# Patient Record
Sex: Male | Born: 1950 | Race: White | Hispanic: No | Marital: Married | Smoking: Former smoker
Health system: Southern US, Community
[De-identification: ages and names within clinical notes are randomized; demographics above are authoritative.]

## PROBLEM LIST (undated history)

## (undated) DIAGNOSIS — Z720 Tobacco use: Secondary | ICD-10-CM

## (undated) DIAGNOSIS — E119 Type 2 diabetes mellitus without complications: Secondary | ICD-10-CM

## (undated) DIAGNOSIS — I499 Cardiac arrhythmia, unspecified: Secondary | ICD-10-CM

## (undated) DIAGNOSIS — Z9289 Personal history of other medical treatment: Secondary | ICD-10-CM

## (undated) DIAGNOSIS — I219 Acute myocardial infarction, unspecified: Secondary | ICD-10-CM

## (undated) DIAGNOSIS — E876 Hypokalemia: Secondary | ICD-10-CM

## (undated) DIAGNOSIS — F32A Depression, unspecified: Secondary | ICD-10-CM

## (undated) DIAGNOSIS — E785 Hyperlipidemia, unspecified: Secondary | ICD-10-CM

## (undated) DIAGNOSIS — N200 Calculus of kidney: Secondary | ICD-10-CM

## (undated) DIAGNOSIS — I340 Nonrheumatic mitral (valve) insufficiency: Secondary | ICD-10-CM

## (undated) DIAGNOSIS — I10 Essential (primary) hypertension: Secondary | ICD-10-CM

## (undated) DIAGNOSIS — F329 Major depressive disorder, single episode, unspecified: Secondary | ICD-10-CM

## (undated) DIAGNOSIS — E669 Obesity, unspecified: Secondary | ICD-10-CM

## (undated) DIAGNOSIS — I4819 Other persistent atrial fibrillation: Secondary | ICD-10-CM

## (undated) DIAGNOSIS — I2510 Atherosclerotic heart disease of native coronary artery without angina pectoris: Secondary | ICD-10-CM

## (undated) HISTORY — DX: Obesity, unspecified: E66.9

## (undated) HISTORY — DX: Tobacco use: Z72.0

## (undated) HISTORY — DX: Type 2 diabetes mellitus without complications: E11.9

## (undated) HISTORY — PX: TONSILLECTOMY: SUR1361

## (undated) HISTORY — DX: Nonrheumatic mitral (valve) insufficiency: I34.0

## (undated) HISTORY — DX: Depression, unspecified: F32.A

## (undated) HISTORY — DX: Atherosclerotic heart disease of native coronary artery without angina pectoris: I25.10

## (undated) HISTORY — DX: Cardiac arrhythmia, unspecified: I49.9

## (undated) HISTORY — PX: HERNIA REPAIR: SHX51

## (undated) HISTORY — DX: Major depressive disorder, single episode, unspecified: F32.9

## (undated) HISTORY — DX: Essential (primary) hypertension: I10

## (undated) HISTORY — DX: Hypokalemia: E87.6

## (undated) HISTORY — DX: Personal history of other medical treatment: Z92.89

## (undated) HISTORY — DX: Other persistent atrial fibrillation: I48.19

## (undated) HISTORY — DX: Hyperlipidemia, unspecified: E78.5

## (undated) HISTORY — PX: LAMINECTOMY: SHX219

---

## 2012-09-17 HISTORY — PX: SPINE SURGERY: SHX786

## 2012-10-24 DIAGNOSIS — M48061 Spinal stenosis, lumbar region without neurogenic claudication: Secondary | ICD-10-CM | POA: Insufficient documentation

## 2013-11-15 DIAGNOSIS — I219 Acute myocardial infarction, unspecified: Secondary | ICD-10-CM

## 2013-11-15 HISTORY — DX: Acute myocardial infarction, unspecified: I21.9

## 2013-12-07 ENCOUNTER — Inpatient Hospital Stay: Payer: Self-pay | Admitting: Internal Medicine

## 2013-12-07 DIAGNOSIS — I10 Essential (primary) hypertension: Secondary | ICD-10-CM

## 2013-12-07 DIAGNOSIS — I214 Non-ST elevation (NSTEMI) myocardial infarction: Secondary | ICD-10-CM

## 2013-12-07 DIAGNOSIS — E785 Hyperlipidemia, unspecified: Secondary | ICD-10-CM

## 2013-12-07 LAB — COMPREHENSIVE METABOLIC PANEL
Albumin: 4.1 g/dL (ref 3.4–5.0)
Alkaline Phosphatase: 74 U/L
Anion Gap: 7 (ref 7–16)
BUN: 24 mg/dL — ABNORMAL HIGH (ref 7–18)
Bilirubin,Total: 0.6 mg/dL (ref 0.2–1.0)
Calcium, Total: 9 mg/dL (ref 8.5–10.1)
Chloride: 102 mmol/L (ref 98–107)
Co2: 30 mmol/L (ref 21–32)
Creatinine: 0.98 mg/dL (ref 0.60–1.30)
EGFR (African American): >60
EGFR (Non-African Amer.): >60
Glucose: 187 mg/dL — ABNORMAL HIGH (ref 65–99)
Osmolality: 287 (ref 275–301)
Potassium: 3.1 mmol/L — ABNORMAL LOW (ref 3.5–5.1)
SGOT(AST): 34 U/L (ref 15–37)
SGPT (ALT): 57 U/L (ref 12–78)
Sodium: 139 mmol/L (ref 136–145)
Total Protein: 7.7 g/dL (ref 6.4–8.2)

## 2013-12-07 LAB — CK TOTAL AND CKMB (NOT AT ARMC)
CK, Total: 656 U/L — ABNORMAL HIGH
CK, Total: 488 U/L — ABNORMAL HIGH
CK-MB: 12.7 ng/mL — ABNORMAL HIGH (ref 0.5–3.6)
CK-MB: 9.7 ng/mL — ABNORMAL HIGH (ref 0.5–3.6)

## 2013-12-07 LAB — CBC WITH DIFFERENTIAL/PLATELET
Basophil #: 0 10*3/uL (ref 0.0–0.1)
Basophil %: 0.9 %
Eosinophil #: 0.2 10*3/uL (ref 0.0–0.7)
Eosinophil %: 3.5 %
HCT: 38.8 % — ABNORMAL LOW (ref 40.0–52.0)
HGB: 13.5 g/dL (ref 13.0–18.0)
Lymphocyte #: 1.6 10*3/uL (ref 1.0–3.6)
Lymphocyte %: 28.9 %
MCH: 30.3 pg (ref 26.0–34.0)
MCHC: 34.8 g/dL (ref 32.0–36.0)
MCV: 87 fL (ref 80–100)
Monocyte #: 0.6 x10 3/mm (ref 0.2–1.0)
Monocyte %: 10.6 %
Neutrophil #: 3.1 10*3/uL (ref 1.4–6.5)
Neutrophil %: 56.1 %
Platelet: 219 10*3/uL (ref 150–440)
RBC: 4.46 10*6/uL (ref 4.40–5.90)
RDW: 13.4 % (ref 11.5–14.5)
WBC: 5.5 10*3/uL (ref 3.8–10.6)

## 2013-12-07 LAB — TROPONIN I
Troponin-I: <0.02 ng/mL
Troponin-I: 0.07 ng/mL — ABNORMAL HIGH
Troponin-I: 0.08 ng/mL — ABNORMAL HIGH

## 2013-12-07 LAB — CBC
HCT: 40.7 % (ref 40.0–52.0)
HGB: 14 g/dL (ref 13.0–18.0)
MCH: 29.8 pg (ref 26.0–34.0)
MCHC: 34.4 g/dL (ref 32.0–36.0)
MCV: 87 fL (ref 80–100)
Platelet: 220 10*3/uL (ref 150–440)
RBC: 4.69 10*6/uL (ref 4.40–5.90)
RDW: 13.5 % (ref 11.5–14.5)
WBC: 6.4 10*3/uL (ref 3.8–10.6)

## 2013-12-07 LAB — CK-MB: CK-MB: 10.8 ng/mL — ABNORMAL HIGH (ref 0.5–3.6)

## 2013-12-07 LAB — APTT: Activated PTT: 29.1 secs (ref 23.6–35.9)

## 2013-12-07 LAB — LIPID PANEL
Cholesterol: 182 mg/dL (ref 0–200)
HDL Cholesterol: 33 mg/dL — ABNORMAL LOW (ref 40–60)
Ldl Cholesterol, Calc: 104 mg/dL — ABNORMAL HIGH (ref 0–100)
Triglycerides: 224 mg/dL — ABNORMAL HIGH (ref 0–200)
VLDL Cholesterol, Calc: 45 mg/dL — ABNORMAL HIGH (ref 5–40)

## 2013-12-07 LAB — PRO B NATRIURETIC PEPTIDE: B-Type Natriuretic Peptide: 197 pg/mL — ABNORMAL HIGH (ref 0–125)

## 2013-12-08 DIAGNOSIS — I2510 Atherosclerotic heart disease of native coronary artery without angina pectoris: Secondary | ICD-10-CM

## 2013-12-08 DIAGNOSIS — I059 Rheumatic mitral valve disease, unspecified: Secondary | ICD-10-CM

## 2013-12-08 HISTORY — PX: CORONARY ANGIOPLASTY: SHX604

## 2013-12-08 HISTORY — PX: CARDIAC CATHETERIZATION: SHX172

## 2013-12-08 LAB — BASIC METABOLIC PANEL
Anion Gap: 7 (ref 7–16)
BUN: 17 mg/dL (ref 7–18)
Calcium, Total: 7.8 mg/dL — ABNORMAL LOW (ref 8.5–10.1)
Chloride: 103 mmol/L (ref 98–107)
Co2: 28 mmol/L (ref 21–32)
Creatinine: 0.85 mg/dL (ref 0.60–1.30)
EGFR (African American): >60
EGFR (Non-African Amer.): >60
Glucose: 165 mg/dL — ABNORMAL HIGH (ref 65–99)
Osmolality: 281 (ref 275–301)
Potassium: 3.2 mmol/L — ABNORMAL LOW (ref 3.5–5.1)
Sodium: 138 mmol/L (ref 136–145)

## 2013-12-08 LAB — APTT
Activated PTT: 41.6 secs — ABNORMAL HIGH (ref 23.6–35.9)
Activated PTT: 52.3 secs — ABNORMAL HIGH (ref 23.6–35.9)
Activated PTT: 57.1 secs — ABNORMAL HIGH (ref 23.6–35.9)
Activated PTT: 57.2 secs — ABNORMAL HIGH (ref 23.6–35.9)

## 2013-12-09 ENCOUNTER — Encounter: Payer: Self-pay | Admitting: Cardiovascular Disease

## 2013-12-09 LAB — BASIC METABOLIC PANEL
Anion Gap: 10 (ref 7–16)
BUN: 11 mg/dL (ref 7–18)
Calcium, Total: 8.3 mg/dL — ABNORMAL LOW (ref 8.5–10.1)
Chloride: 102 mmol/L (ref 98–107)
Co2: 28 mmol/L (ref 21–32)
Creatinine: 0.84 mg/dL (ref 0.60–1.30)
EGFR (African American): >60
EGFR (Non-African Amer.): >60
Glucose: 113 mg/dL — ABNORMAL HIGH (ref 65–99)
Osmolality: 280 (ref 275–301)
Potassium: 2.9 mmol/L — ABNORMAL LOW (ref 3.5–5.1)
Sodium: 140 mmol/L (ref 136–145)

## 2013-12-09 LAB — POTASSIUM: Potassium: 4 mmol/L (ref 3.5–5.1)

## 2013-12-09 LAB — CK TOTAL AND CKMB (NOT AT ARMC)
CK, Total: 235 U/L
CK-MB: 6.3 ng/mL — ABNORMAL HIGH (ref 0.5–3.6)

## 2013-12-09 LAB — MAGNESIUM: Magnesium: 2.1 mg/dL

## 2013-12-14 ENCOUNTER — Encounter: Payer: Self-pay | Admitting: Cardiovascular Disease

## 2013-12-14 ENCOUNTER — Encounter: Payer: Self-pay | Admitting: *Deleted

## 2013-12-14 ENCOUNTER — Ambulatory Visit (INDEPENDENT_AMBULATORY_CARE_PROVIDER_SITE_OTHER): Payer: BC Managed Care – PPO | Admitting: Cardiovascular Disease

## 2013-12-14 VITALS — BP 140/90 | HR 57 | Ht 66.5 in | Wt 191.5 lb

## 2013-12-14 DIAGNOSIS — E785 Hyperlipidemia, unspecified: Secondary | ICD-10-CM

## 2013-12-14 DIAGNOSIS — Z955 Presence of coronary angioplasty implant and graft: Secondary | ICD-10-CM

## 2013-12-14 DIAGNOSIS — I10 Essential (primary) hypertension: Secondary | ICD-10-CM

## 2013-12-14 DIAGNOSIS — I214 Non-ST elevation (NSTEMI) myocardial infarction: Secondary | ICD-10-CM

## 2013-12-14 DIAGNOSIS — Z9861 Coronary angioplasty status: Secondary | ICD-10-CM

## 2013-12-14 DIAGNOSIS — I2510 Atherosclerotic heart disease of native coronary artery without angina pectoris: Secondary | ICD-10-CM

## 2013-12-14 NOTE — Assessment & Plan Note (Signed)
DES Lasix to the ostial PLV. Suggested he stay on his aspirin and effient.

## 2013-12-14 NOTE — Assessment & Plan Note (Addendum)
Severe three-vessel disease on recent cardiac catheterization following non-ST elevation MI. Recent stent placed to his ostial posterior lateral branch off the distal RCA. The plan is for repeat catheterization next week with intervention to his diagonal vessel, and attempt to open his left circumflex stenosis. We will continue his aggressive antianginals

## 2013-12-14 NOTE — Assessment & Plan Note (Signed)
He seems to have recovered well from his NSTEMI. Culprit for this likely his distal RCA branch though still with residual severe disease of diagonal vessel and left circumflex. Further intervention scheduled for next week

## 2013-12-14 NOTE — Progress Notes (Signed)
Patient ID: Clinton AxonRaymond Dorsey, male    DOB: 11-05-1950, 63 y.o.   MRN: [id number]  HPI Comments: Clinton Dorsey is a very pleasant 63 year old gentleman with a history of hyperlipidemia, hypertension, prediabetes, strong family history of coronary artery disease, prior occasional tobacco abuse who presented to [facility] 12/07/2013 with chest pain. He ruled in for non-ST elevation MI with CK-MB of 17.7, CK of 656.  Cardiac catheterization was performed that showed severe three-vessel CAD. He had stent for distal RCA/ostial PL branch disease estimated at 99%. He also had mid circumflex with 99% disease, severe diagonal #1 disease. Attempt to place a stent to the mid circumflex was unsuccessful and given that he had significant diet radiation exposure to perform a diagnostic catheterization and thinks the distal RCA, it was recommended that he have PCI of his left circumflex and diagonal vessel at a later date.  In followup today, he reports having laid low over the past week with no significant exertion or other activities. He denies having shortness of breath or chest pain but reports having not been anything around the house if. He feels that his energy might be somewhat better than before  He reports having problems in the past on Crestor, Lipitor, simvastatin    echocardiogram in the hospital showed ejection fraction 50-55%, mildly dilated left atrium, mild TR, low to moderate MR, no focal wall motion abnormalities noted EKG in the hospital 12/08/2013 shows normal sinus rhythm with poor R-wave progression through the anterior precordial leads, PVC EKG today shows normal sinus rhythm with rate 57 beats per minute with nonspecific ST abnormality   Outpatient Encounter Prescriptions as of 12/14/2013  Medication Sig  . amLODipine (NORVASC) 10 MG tablet Take 10 mg by mouth daily.  Marland Kitchen. aspirin 81 MG tablet Take 81 mg by mouth daily.  Marland Kitchen. buPROPion (WELLBUTRIN XL) 150 MG 24 hr tablet Take 150 mg by mouth daily.  .  carvedilol (COREG) 6.25 MG tablet Take 6.25 mg by mouth 2 (two) times daily with a meal.  . isosorbide mononitrate (IMDUR) 30 MG 24 hr tablet Take 30 mg by mouth daily.  Marland Kitchen. losartan (COZAAR) 50 MG tablet Take 50 mg by mouth daily.  . Multiple Vitamin (MULTIVITAMIN) capsule Take 1 capsule by mouth daily.  . nitroGLYCERIN (NITROSTAT) 0.4 MG SL tablet Place 0.4 mg under the tongue every 5 (five) minutes as needed for chest pain.  Marland Kitchen. Potassium Chloride ER 20 MEQ TBCR Take 20 mEq by mouth daily.  . prasugrel (EFFIENT) 10 MG TABS tablet Take 10 mg by mouth daily.  . rosuvastatin (CRESTOR) 20 MG tablet Take 20 mg by mouth daily.     Review of Systems  Constitutional: Negative.   HENT: Negative.   Eyes: Negative.   Respiratory: Negative.   Cardiovascular: Negative.   Gastrointestinal: Negative.   Endocrine: Negative.   Musculoskeletal: Negative.   Skin: Negative.   Allergic/Immunologic: Negative.   Neurological: Negative.   Hematological: Negative.   Psychiatric/Behavioral: Negative.   All other systems reviewed and are negative.    BP 140/90  Pulse 57  Ht 5' 6.5" (1.689 m)  Wt 191 lb 8 oz (86.864 kg)  BMI 30.45 kg/m2  Physical Exam  Nursing note and vitals reviewed. Constitutional: He is oriented to person, place, and time. He appears well-developed and well-nourished.  HENT:  Head: Normocephalic.  Nose: Nose normal.  Mouth/Throat: Oropharynx is clear and moist.  Eyes: Conjunctivae are normal. Pupils are equal, round, and reactive to light.  Neck: Normal range  of motion. Neck supple. No JVD present.  Cardiovascular: Normal rate, regular rhythm, S1 normal, S2 normal, normal heart sounds and intact distal pulses.  Exam reveals no gallop and no friction rub.   No murmur heard. Pulmonary/Chest: Effort normal and breath sounds normal. No respiratory distress. He has no wheezes. He has no rales. He exhibits no tenderness.  Abdominal: Soft. Bowel sounds are normal. He exhibits no  distension. There is no tenderness.  Musculoskeletal: Normal range of motion. He exhibits no edema and no tenderness.  Lymphadenopathy:    He has no cervical adenopathy.  Neurological: He is alert and oriented to person, place, and time. Coordination normal.  Skin: Skin is warm and dry. No rash noted. No erythema.  Psychiatric: He has a normal mood and affect. His behavior is normal. Judgment and thought content normal.      Assessment and Plan

## 2013-12-14 NOTE — Assessment & Plan Note (Signed)
Given his previous statin intolerance, recommended he start Crestor 5 mg with slow titration upwards as tolerated. Samples give.

## 2013-12-14 NOTE — Assessment & Plan Note (Signed)
Blood pressure is well controlled on today's visit. No changes made to the medications. 

## 2013-12-14 NOTE — Patient Instructions (Addendum)
You are doing well. No medication changes were made.  We will schedule a cardiac cath for a week for Wednesday in [place]:   Wednesday, April 8 w/ Dr. Kirke Corin Please arrive at the [facility] at 12:30pm on 12/23/13 You may have a light breakfast that morning, but do not eat anything after 9:00am You may take all of your meds with a small amount of water You must have someone drive you home and to be with your for at least 24 hrs after the procedure.  Please call us if you have new issues that need to be addressed before your next appt.  Your physician wants you to follow-up in:  3 to 4 weeks   Your physician has requested that you have a cardiac catheterization. Cardiac catheterization is used to diagnose and/or treat various heart conditions. Doctors may recommend this procedure for a number of different reasons. The most common reason is to evaluate chest pain. Chest pain can be a symptom of coronary artery disease (CAD), and cardiac catheterization can show whether plaque is narrowing or blocking your heart's arteries. This procedure is also used to evaluate the valves, as well as measure the blood flow and oxygen levels in different parts of your heart. For further information please visit [email or web address] Please follow instruction sheet, as given.

## 2013-12-16 ENCOUNTER — Encounter (HOSPITAL_COMMUNITY): Payer: Self-pay | Admitting: Pharmacy Technician

## 2013-12-22 ENCOUNTER — Other Ambulatory Visit: Payer: Self-pay | Admitting: Cardiovascular Disease

## 2013-12-22 DIAGNOSIS — I2510 Atherosclerotic heart disease of native coronary artery without angina pectoris: Secondary | ICD-10-CM

## 2013-12-23 ENCOUNTER — Encounter (HOSPITAL_COMMUNITY)
Admission: RE | Disposition: A | Discharge status: Home / Self Care | Payer: BC Managed Care – PPO | Source: Ambulatory Visit | Attending: Cardiovascular Disease

## 2013-12-23 ENCOUNTER — Ambulatory Visit (HOSPITAL_COMMUNITY)
Admission: RE | Admit: 2013-12-23 | Discharge: 2013-12-24 | Disposition: A | Discharge status: Home / Self Care | Payer: BC Managed Care – PPO | Source: Ambulatory Visit | Attending: Cardiovascular Disease | Admitting: Cardiovascular Disease

## 2013-12-23 DIAGNOSIS — I209 Angina pectoris, unspecified: Secondary | ICD-10-CM | POA: Insufficient documentation

## 2013-12-23 DIAGNOSIS — Z7982 Long term (current) use of aspirin: Secondary | ICD-10-CM | POA: Insufficient documentation

## 2013-12-23 DIAGNOSIS — I208 Other forms of angina pectoris: Secondary | ICD-10-CM

## 2013-12-23 DIAGNOSIS — I2510 Atherosclerotic heart disease of native coronary artery without angina pectoris: Secondary | ICD-10-CM

## 2013-12-23 DIAGNOSIS — I214 Non-ST elevation (NSTEMI) myocardial infarction: Secondary | ICD-10-CM

## 2013-12-23 DIAGNOSIS — I10 Essential (primary) hypertension: Secondary | ICD-10-CM | POA: Insufficient documentation

## 2013-12-23 DIAGNOSIS — Z6831 Body mass index (BMI) 31.0-31.9, adult: Secondary | ICD-10-CM | POA: Insufficient documentation

## 2013-12-23 DIAGNOSIS — Z7902 Long term (current) use of antithrombotics/antiplatelets: Secondary | ICD-10-CM | POA: Insufficient documentation

## 2013-12-23 DIAGNOSIS — R7309 Other abnormal glucose: Secondary | ICD-10-CM | POA: Insufficient documentation

## 2013-12-23 DIAGNOSIS — E785 Hyperlipidemia, unspecified: Secondary | ICD-10-CM

## 2013-12-23 DIAGNOSIS — Z955 Presence of coronary angioplasty implant and graft: Secondary | ICD-10-CM

## 2013-12-23 DIAGNOSIS — Z8249 Family history of ischemic heart disease and other diseases of the circulatory system: Secondary | ICD-10-CM | POA: Insufficient documentation

## 2013-12-23 DIAGNOSIS — I2089 Other forms of angina pectoris: Secondary | ICD-10-CM | POA: Insufficient documentation

## 2013-12-23 DIAGNOSIS — E669 Obesity, unspecified: Secondary | ICD-10-CM | POA: Insufficient documentation

## 2013-12-23 HISTORY — PX: PERCUTANEOUS CORONARY STENT INTERVENTION (PCI-S): SHX5485

## 2013-12-23 LAB — CBC
HCT: 39.2 % (ref 39.0–52.0)
Hemoglobin: 14.1 g/dL (ref 13.0–17.0)
MCH: 30.7 pg (ref 26.0–34.0)
MCHC: 36 g/dL (ref 30.0–36.0)
MCV: 85.4 fL (ref 78.0–100.0)
Platelets: 285 10*3/uL (ref 150–400)
RBC: 4.59 MIL/uL (ref 4.22–5.81)
RDW: 13 % (ref 11.5–15.5)
WBC: 6.8 10*3/uL (ref 4.0–10.5)

## 2013-12-23 LAB — PROTIME-INR
INR: 0.92 (ref 0.00–1.49)
Prothrombin Time: 12.2 seconds (ref 11.6–15.2)

## 2013-12-23 LAB — BASIC METABOLIC PANEL
BUN: 20 mg/dL (ref 6–23)
CO2: 26 mEq/L (ref 19–32)
Calcium: 9.5 mg/dL (ref 8.4–10.5)
Chloride: 101 mEq/L (ref 96–112)
Creatinine, Ser: 0.98 mg/dL (ref 0.50–1.35)
GFR calc Af Amer: >90 mL/min (ref >90)
GFR calc non Af Amer: 86 mL/min — ABNORMAL LOW (ref >90)
Glucose, Bld: 143 mg/dL — ABNORMAL HIGH (ref 70–99)
Potassium: 3.7 mEq/L (ref 3.7–5.3)
Sodium: 142 mEq/L (ref 137–147)

## 2013-12-23 LAB — POCT ACTIVATED CLOTTING TIME: Activated Clotting Time: 548 seconds

## 2013-12-23 SURGERY — PERCUTANEOUS CORONARY STENT INTERVENTION (PCI-S)
Anesthesia: LOCAL

## 2013-12-23 MED ORDER — ADULT MULTIVITAMIN W/MINERALS CH
1.0000 | ORAL_TABLET | Freq: Every day | ORAL | Status: DC
  Administered 2013-12-24: 10:00:00 1 via ORAL
  Filled 2013-12-23 (×2): qty 1

## 2013-12-23 MED ORDER — CARVEDILOL 6.25 MG PO TABS
6.2500 mg | ORAL_TABLET | Freq: Two times a day (BID) | ORAL | Status: DC
  Administered 2013-12-24: 6.25 mg via ORAL
  Filled 2013-12-23 (×3): qty 1

## 2013-12-23 MED ORDER — NITROGLYCERIN 0.4 MG SL SUBL: 0.4000 mg | SUBLINGUAL_TABLET | SUBLINGUAL | Status: DC | PRN

## 2013-12-23 MED ORDER — PRASUGREL HCL 10 MG PO TABS
10.0000 mg | ORAL_TABLET | Freq: Every day | ORAL | Status: DC
  Administered 2013-12-24: 10 mg via ORAL
  Filled 2013-12-23 (×2): qty 1

## 2013-12-23 MED ORDER — ASPIRIN 81 MG PO CHEW
81.0000 mg | CHEWABLE_TABLET | Freq: Every day | ORAL | Status: DC
  Administered 2013-12-24: 10:00:00 81 mg via ORAL
  Filled 2013-12-23: qty 1

## 2013-12-23 MED ORDER — VERAPAMIL HCL 2.5 MG/ML IV SOLN
INTRAVENOUS | Status: AC
  Filled 2013-12-23: qty 2

## 2013-12-23 MED ORDER — SODIUM CHLORIDE 0.9 % IJ SOLN: 3.0000 mL | Freq: Two times a day (BID) | INTRAMUSCULAR | Status: DC

## 2013-12-23 MED ORDER — MULTIVITAMINS PO CAPS: 1.0000 | ORAL_CAPSULE | Freq: Every day | ORAL | Status: DC

## 2013-12-23 MED ORDER — SODIUM CHLORIDE 0.9 % IV SOLN: 250.0000 mL | INTRAVENOUS | Status: DC | PRN

## 2013-12-23 MED ORDER — PRASUGREL HCL 10 MG PO TABS: 10.0000 mg | ORAL_TABLET | ORAL | Status: DC

## 2013-12-23 MED ORDER — FENTANYL CITRATE 0.05 MG/ML IJ SOLN
INTRAMUSCULAR | Status: AC
  Filled 2013-12-23: qty 2

## 2013-12-23 MED ORDER — HEPARIN (PORCINE) IN NACL 2-0.9 UNIT/ML-% IJ SOLN
INTRAMUSCULAR | Status: AC
  Filled 2013-12-23: qty 1500

## 2013-12-23 MED ORDER — BUPROPION HCL ER (XL) 150 MG PO TB24
150.0000 mg | ORAL_TABLET | Freq: Every day | ORAL | Status: DC
  Administered 2013-12-24: 150 mg via ORAL
  Filled 2013-12-23 (×2): qty 1

## 2013-12-23 MED ORDER — AMLODIPINE BESYLATE 10 MG PO TABS
10.0000 mg | ORAL_TABLET | Freq: Every day | ORAL | Status: DC
  Filled 2013-12-23 (×2): qty 1

## 2013-12-23 MED ORDER — LIDOCAINE HCL (PF) 1 % IJ SOLN
INTRAMUSCULAR | Status: AC
  Filled 2013-12-23: qty 30

## 2013-12-23 MED ORDER — MAGNESIUM OXIDE 400 MG PO TABS
400.0000 mg | ORAL_TABLET | Freq: Every day | ORAL | Status: DC
  Administered 2013-12-24: 400 mg via ORAL
  Filled 2013-12-23 (×2): qty 1

## 2013-12-23 MED ORDER — SODIUM CHLORIDE 0.9 % IJ SOLN: 3.0000 mL | INTRAMUSCULAR | Status: DC | PRN

## 2013-12-23 MED ORDER — BIVALIRUDIN 250 MG IV SOLR
INTRAVENOUS | Status: AC
  Filled 2013-12-23: qty 250

## 2013-12-23 MED ORDER — MIDAZOLAM HCL 2 MG/2ML IJ SOLN
INTRAMUSCULAR | Status: AC
  Filled 2013-12-23: qty 2

## 2013-12-23 MED ORDER — ISOSORBIDE MONONITRATE ER 30 MG PO TB24
30.0000 mg | ORAL_TABLET | Freq: Every day | ORAL | Status: DC
  Administered 2013-12-24: 10:00:00 30 mg via ORAL
  Filled 2013-12-23 (×2): qty 1

## 2013-12-23 MED ORDER — NITROGLYCERIN 0.2 MG/ML ON CALL CATH LAB
INTRAVENOUS | Status: AC
  Filled 2013-12-23: qty 1

## 2013-12-23 MED ORDER — SODIUM CHLORIDE 0.9 % IV SOLN
INTRAVENOUS | Status: DC
  Administered 2013-12-23: 13:00:00 via INTRAVENOUS

## 2013-12-23 MED ORDER — LOSARTAN POTASSIUM 50 MG PO TABS
50.0000 mg | ORAL_TABLET | Freq: Every day | ORAL | Status: DC
  Administered 2013-12-24: 50 mg via ORAL
  Filled 2013-12-23 (×2): qty 1

## 2013-12-23 MED ORDER — SODIUM CHLORIDE 0.9 % IV SOLN
INTRAVENOUS | Status: AC
Start: 2013-12-23 — End: 2013-12-24
  Administered 2013-12-23: 19:00:00 via INTRAVENOUS

## 2013-12-23 MED ORDER — ASPIRIN 81 MG PO TABS: 81.0000 mg | ORAL_TABLET | Freq: Every day | ORAL | Status: DC

## 2013-12-23 MED ORDER — ASPIRIN 81 MG PO CHEW: 81.0000 mg | CHEWABLE_TABLET | ORAL | Status: DC

## 2013-12-23 NOTE — Interval H&P Note (Signed)
Cath Lab Visit (complete for each Cath Lab visit)  Clinical Evaluation Leading to the Procedure:   ACS: yes  Non-ACS:    Anginal Classification: CCS III  Anti-ischemic medical therapy: Maximal Therapy (2 or more classes of medications)  Non-Invasive Test Results: No non-invasive testing performed  Prior CABG: No previous CABG      History and Physical Interval Note:  12/23/2013 3:20 PM  Clinton Dorsey  has presented today for surgery, with the diagnosis of CP/Angina  The various methods of treatment have been discussed with the patient and family. After consideration of risks, benefits and other options for treatment, the patient has consented to  Procedure(s): PERCUTANEOUS CORONARY STENT INTERVENTION (PCI-S) (N/A) as a surgical intervention .  The patient's history has been reviewed, patient examined, no change in status, stable for surgery.  I have reviewed the patient's chart and labs.  Questions were answered to the patient's satisfaction.     Clinton Dorsey

## 2013-12-23 NOTE — H&P (View-Only) (Signed)
 Patient ID: Clinton Dorsey, male    DOB: 10/15/1950, 62 y.o.   MRN: [id number]  HPI Comments: Clinton Dorsey is a very pleasant 62-year-old gentleman with a history of hyperlipidemia, hypertension, prediabetes, strong family history of coronary artery disease, prior occasional tobacco abuse who presented to [facility] 12/07/2013 with chest pain. He ruled in for non-ST elevation MI with CK-MB of 17.7, CK of 656.  Cardiac catheterization was performed that showed severe three-vessel CAD. He had stent for distal RCA/ostial PL branch disease estimated at 99%. He also had mid circumflex with 99% disease, severe diagonal #1 disease. Attempt to place a stent to the mid circumflex was unsuccessful and given that he had significant diet radiation exposure to perform a diagnostic catheterization and thinks the distal RCA, it was recommended that he have PCI of his left circumflex and diagonal vessel at a later date.  In followup today, he reports having laid low over the past week with no significant exertion or other activities. He denies having shortness of breath or chest pain but reports having not been anything around the house if. He feels that his energy might be somewhat better than before  He reports having problems in the past on Crestor, Lipitor, simvastatin    echocardiogram in the hospital showed ejection fraction 50-55%, mildly dilated left atrium, mild TR, low to moderate MR, no focal wall motion abnormalities noted EKG in the hospital 12/08/2013 shows normal sinus rhythm with poor R-wave progression through the anterior precordial leads, PVC EKG today shows normal sinus rhythm with rate 57 beats per minute with nonspecific ST abnormality   Outpatient Encounter Prescriptions as of 12/14/2013  Medication Sig  . amLODipine (NORVASC) 10 MG tablet Take 10 mg by mouth daily.  . aspirin 81 MG tablet Take 81 mg by mouth daily.  . buPROPion (WELLBUTRIN XL) 150 MG 24 hr tablet Take 150 mg by mouth daily.  .  carvedilol (COREG) 6.25 MG tablet Take 6.25 mg by mouth 2 (two) times daily with a meal.  . isosorbide mononitrate (IMDUR) 30 MG 24 hr tablet Take 30 mg by mouth daily.  . losartan (COZAAR) 50 MG tablet Take 50 mg by mouth daily.  . Multiple Vitamin (MULTIVITAMIN) capsule Take 1 capsule by mouth daily.  . nitroGLYCERIN (NITROSTAT) 0.4 MG SL tablet Place 0.4 mg under the tongue every 5 (five) minutes as needed for chest pain.  . Potassium Chloride ER 20 MEQ TBCR Take 20 mEq by mouth daily.  . prasugrel (EFFIENT) 10 MG TABS tablet Take 10 mg by mouth daily.  . rosuvastatin (CRESTOR) 20 MG tablet Take 20 mg by mouth daily.     Review of Systems  Constitutional: Negative.   HENT: Negative.   Eyes: Negative.   Respiratory: Negative.   Cardiovascular: Negative.   Gastrointestinal: Negative.   Endocrine: Negative.   Musculoskeletal: Negative.   Skin: Negative.   Allergic/Immunologic: Negative.   Neurological: Negative.   Hematological: Negative.   Psychiatric/Behavioral: Negative.   All other systems reviewed and are negative.    BP 140/90  Pulse 57  Ht 5' 6.5" (1.689 m)  Wt 191 lb 8 oz (86.864 kg)  BMI 30.45 kg/m2  Physical Exam  Nursing note and vitals reviewed. Constitutional: He is oriented to person, place, and time. He appears well-developed and well-nourished.  HENT:  Head: Normocephalic.  Nose: Nose normal.  Mouth/Throat: Oropharynx is clear and moist.  Eyes: Conjunctivae are normal. Pupils are equal, round, and reactive to light.  Neck: Normal range   of motion. Neck supple. No JVD present.  Cardiovascular: Normal rate, regular rhythm, S1 normal, S2 normal, normal heart sounds and intact distal pulses.  Exam reveals no gallop and no friction rub.   No murmur heard. Pulmonary/Chest: Effort normal and breath sounds normal. No respiratory distress. He has no wheezes. He has no rales. He exhibits no tenderness.  Abdominal: Soft. Bowel sounds are normal. He exhibits no  distension. There is no tenderness.  Musculoskeletal: Normal range of motion. He exhibits no edema and no tenderness.  Lymphadenopathy:    He has no cervical adenopathy.  Neurological: He is alert and oriented to person, place, and time. Coordination normal.  Skin: Skin is warm and dry. No rash noted. No erythema.  Psychiatric: He has a normal mood and affect. His behavior is normal. Judgment and thought content normal.      Assessment and Plan

## 2013-12-23 NOTE — Progress Notes (Signed)
TR BAND REMOVAL  LOCATION:    right radial  DEFLATED PER PROTOCOL:    yes  TIME BAND OFF / DRESSING APPLIED:    20:35   SITE UPON ARRIVAL:    Level 0  SITE AFTER BAND REMOVAL:    Level 0  REVERSE ALLEN'S TEST:     positive  CIRCULATION SENSATION AND MOVEMENT:    Within Normal Limits   yes  COMMENTS:   Pt tolerated removal of TR band without complication. Will continue to monitor pt.

## 2013-12-23 NOTE — CV Procedure (Signed)
   CARDIAC CATH NOTE  Name: Clinton Dorsey MRN: [id number] DOB: 02/14/1951  Procedure: PTCA and drug-eluting stenting of the first diagonal and mid left circumflex.  Indication: Recent non-ST elevation myocardial infarction with three-vessel coronary artery disease.  Medications:  Sedation:  2 mg IV Versed, 100 mcg IV Fentanyl  Contrast:  185 mL Omnipaque  Procedural Details: The right wrist was prepped, draped, and anesthetized with 1% lidocaine. Using the modified Seldinger technique, a 6 Fr sheath was introduced into the radial artery. 3 mg verapamil was administered through the radial sheath. Weight-based bivalirudin was given for anticoagulation. Once a therapeutic ACT was achieved, a 6 [place] XB LAD 3.5 guide catheter was inserted.  A run through coronary guidewire was used to cross the lesion in the proximal first diagonal.  The lesion was predilated with a 2.0 x 12 mm balloon to 8 atmospheres balloon.  The lesion was then stented with a 2.5 x 18 mm Xience drug-eluting stent.  The stent was postdilated with a 2.75 x 15 mm noncompliant balloon.  Following PCI, there was 0% residual stenosis and TIMI-3 flow. The wire was then removed and advanced into the left circumflex. The balloon was advanced to the midsegment to provide support. I was able to cross the lesion in the mid left circumflex without difficulty. The lesion was predilated with a 2.5 x 15 mm sprinter balloon. It was stented with a 2.5 x 28 mm  Xience drug-eluting stent. This was post dilated with a 2.75 x 20 mm noncompliant balloon proximally. OM 2 was a jailed by the stent but this branch is overall small in size with TIMI-3 flow. Final angiography confirmed an excellent result. The patient tolerated the procedure well. There were no immediate procedural complications. A TR band was used for radial hemostasis. The patient was transferred to the post catheterization recovery area for further monitoring.  the patient did have mild  chest discomfort during angioplasty of the left circumflex which persisted for about 15 minutes after the procedure and resolved spontaneously.  PCI Data: Vessel - first diagonal/Segment - proximal Percent Stenosis (pre)  90% TIMI-flow 3 Stent 2.5 x 18 mm Xience drug-eluting stent postdilated with a 2.75 noncompliant balloon Percent Stenosis (post) 0% TIMI-flow (post) 3  Vessel - left circumflex/Segment - mid  Percent Stenosis (pre)  95% TIMI-flow 3 Stent 2.5 x 28 mm Xience drug-eluting stent postdilated with a 2.75 noncompliant balloon Percent Stenosis (post) 0% TIMI-flow (post) 3  Final Conclusions:  Successful angioplasty and drug-eluting stent placement to both first diagonal and mid left circumflex.  Recommendations:  Continue dual antiplatelet therapy for at least one year. There is a residual 40% distal left main stenosis and 50% ostial left circumflex stenosis. Aggressive medical therapy is recommended.  [name] MD, [facility] 12/23/2013, 4:50 PM

## 2013-12-24 ENCOUNTER — Encounter (HOSPITAL_COMMUNITY): Payer: Self-pay | Admitting: *Deleted

## 2013-12-24 DIAGNOSIS — I209 Angina pectoris, unspecified: Secondary | ICD-10-CM

## 2013-12-24 DIAGNOSIS — I214 Non-ST elevation (NSTEMI) myocardial infarction: Secondary | ICD-10-CM

## 2013-12-24 DIAGNOSIS — I2510 Atherosclerotic heart disease of native coronary artery without angina pectoris: Secondary | ICD-10-CM

## 2013-12-24 DIAGNOSIS — Z9861 Coronary angioplasty status: Secondary | ICD-10-CM

## 2013-12-24 LAB — BASIC METABOLIC PANEL
BUN: 12 mg/dL (ref 6–23)
BUN: 14 mg/dL (ref 6–23)
CO2: 25 mEq/L (ref 19–32)
CO2: 27 mEq/L (ref 19–32)
Calcium: 9 mg/dL (ref 8.4–10.5)
Calcium: 9.4 mg/dL (ref 8.4–10.5)
Chloride: 101 mEq/L (ref 96–112)
Chloride: 98 mEq/L (ref 96–112)
Creatinine, Ser: 0.79 mg/dL (ref 0.50–1.35)
Creatinine, Ser: 0.75 mg/dL (ref 0.50–1.35)
GFR calc Af Amer: >90 mL/min (ref >90)
GFR calc Af Amer: >90 mL/min (ref >90)
GFR calc non Af Amer: >90 mL/min (ref >90)
GFR calc non Af Amer: >90 mL/min (ref >90)
Glucose, Bld: 114 mg/dL — ABNORMAL HIGH (ref 70–99)
Glucose, Bld: 137 mg/dL — ABNORMAL HIGH (ref 70–99)
Potassium: 3.2 mEq/L — ABNORMAL LOW (ref 3.7–5.3)
Potassium: 3.8 mEq/L (ref 3.7–5.3)
Sodium: 143 mEq/L (ref 137–147)
Sodium: 138 mEq/L (ref 137–147)

## 2013-12-24 LAB — CBC
HCT: 40 % (ref 39.0–52.0)
Hemoglobin: 14.2 g/dL (ref 13.0–17.0)
MCH: 30.3 pg (ref 26.0–34.0)
MCHC: 35.5 g/dL (ref 30.0–36.0)
MCV: 85.3 fL (ref 78.0–100.0)
Platelets: 262 10*3/uL (ref 150–400)
RBC: 4.69 MIL/uL (ref 4.22–5.81)
RDW: 13.1 % (ref 11.5–15.5)
WBC: 8.8 10*3/uL (ref 4.0–10.5)

## 2013-12-24 MED ORDER — POTASSIUM CHLORIDE CRYS ER 20 MEQ PO TBCR
40.0000 meq | EXTENDED_RELEASE_TABLET | Freq: Once | ORAL | Status: AC
  Administered 2013-12-24: 10:00:00 40 meq via ORAL
  Filled 2013-12-24: qty 2

## 2013-12-24 MED FILL — Sodium Chloride IV Soln 0.9%: INTRAVENOUS | Qty: 50 | Status: AC

## 2013-12-24 NOTE — Care Management Note (Addendum)
  Page 1 of 1   12/24/2013     10:09:12 AM   CARE MANAGEMENT NOTE 12/24/2013  Patient:  Clinton Dorsey, Clinton Dorsey   Account Number:  [id number]  Date Initiated:  12/24/2013  Documentation initiated by:  Donato Schultz  Subjective/Objective Assessment:   admitted with CP/angina     Action/Plan:   CM to follow for dispositon needs   Anticipated DC Date:  12/24/2013   Anticipated DC Plan:  HOME/SELF CARE         Choice offered to / List presented to:             Status of service:  Completed, signed off Medicare Important Message given?   (If response is "NO", the following Medicare IM given date fields will be blank) Date Medicare IM given:   Date Additional Medicare IM given:    Discharge Disposition:  HOME/SELF CARE  Per UR Regulation:    If discussed at Long Length of Stay Meetings, dates discussed:    Comments:  CM Screening Note: PERCUTANEOUS CORONARY STENT INTERVENTION (PCI-S) (N/A) as a surgical intervention on 12/23/2013 Recommends continue Effient (Patient already taking prior to this admission) No other CM needs identified. Kathye Cipriani RN, BSN, [place], [place] 12/24/2013

## 2013-12-24 NOTE — Progress Notes (Signed)
CARDIAC REHAB PHASE I   PRE:  Rate/Rhythm: 65 SR  BP:  Supine: 160/92  Sitting:   Standing:    SaO2:   MODE:  Ambulation: 800 ft   POST:  Rate/Rhythm: 75 SR  BP:  Supine:   Sitting: 176/86  Standing:    SaO2:  0758-0902 Pt walked 800 ft with steady gait. Tolerated well. No CP. Education completed with pt and wife. Included MI ed since pt with recent MI. Understanding voiced. Gave effient booklet. Discussed CRP 2 and permission given for referral to [facility]. Pt is Optician, dispensing. Discussed decreasing stress also.   Luetta Nutting, RN BSN  12/24/2013 8:59 AM

## 2013-12-24 NOTE — Discharge Summary (Signed)
Discharge Summary   Patient ID: Clinton Dorsey MRN: [id number], DOB/AGE: December 19, 1950 63 y.o. Admit date: 12/23/2013 D/C date:     12/24/2013  Primary Cardiologist: Dr. Mariah Milling   Principal Problem:   Coronary atherosclerosis of native coronary artery Active Problems:   NSTEMI (non-ST elevated myocardial infarction)   Hyperlipemia   Essential hypertension   Obesity -Body mass index is 31.01 kg/(m^2).   Pre-Diabetes  Admission Dates: 12/23/13 - 12/24/13 Discharge Diagnosis: CAD s/p PCI w/ DES to both D1 and mLCx   HPI: [name] is a very pleasant 63 year old gentleman with a history of hyperlipidemia, hypertension, prediabetes, strong family history of coronary artery disease and prior occasional tobacco abuse who presented to [facility] 12/07/2013 with chest pain and ruled in for NSTEMI. Cardiac catheterization was performed that showed severe three-vessel CAD. He had stent for distal RCA/ostial PL branch disease estimated at 99%. He also had mid circumflex with 99% disease, severe diagonal #1 disease. Attempt to place a stent to the mid circumflex was unsuccessful and given that he had significant diet radiation exposure to perform a diagnostic catheterization and thinks the distal RCA, it was recommended that he have PCI of his left circumflex and diagonal vessel at a later date. He was seen in the office on 12/14/13 and [facility] for planned PCI of LCx and D1 was scheduled at [place].   Hospital Course: He presented to [facility] on 12/23/13 for elective cardiac catheterization with percutaneous coronary intervention. He underwent cardiac cath and is now s/p successful angioplasty and drug-eluting stent placement to both first diagonal and mid left circumflex. He remains on DAPT with ASA/Effient, which will be continued for at least one year. There is a residual 40% distal left main stenosis and 50% ostial left circumflex stenosis. Aggressive medical therapy is recommended. His potassium was noted to be  3.2 on the day of discharge. He was given a one time dose of KDur 40 MEq and his K normalized to 3.8.  The patient has had an uncomplicated hospital course and is recovering well. The radial catheter site is stable. He has been seen by Dr. Herbie Baltimore today and deemed stable for discharge home. All follow-up appointments have been scheduled. Discharge medications include Coreg 6.25 bid, Losartan 50mg , ASA/Effient, NTG and Crestor 5mg . Imdur & Amlodipine have been held for now and can be readdressed in OP ROV that is already scheduled with Dr. Mariah Milling.   Discharge Vitals: Blood pressure 148/90, pulse 61, temperature 98.2 F (36.8 C), temperature source Oral, resp. rate 18, height 5\' 6"  (1.676 m), weight 192 lb 0.3 oz (87.1 kg), SpO2 94.00%.  Labs: Lab Results  Component Value Date   WBC 8.8 12/24/2013   HGB 14.2 12/24/2013   HCT 40.0 12/24/2013   MCV 85.3 12/24/2013   PLT 262 12/24/2013     Recent Labs Lab 12/24/13 1210  NA 138  K 3.8  CL 98  CO2 27  BUN 14  CREATININE 0.75  CALCIUM 9.4  GLUCOSE 137*    Diagnostic Studies/Procedures   CARDIAC CATH NOTE  Name: Clinton Dorsey  MRN: [id number]  DOB: May 16, 1951  Procedure: PTCA and drug-eluting stenting of the first diagonal and mid left circumflex.  Indication: Recent non-ST elevation myocardial infarction with three-vessel coronary artery disease.  Medications:  Sedation: 2 mg IV Versed, 100 mcg IV Fentanyl  Contrast: 185 mL Omnipaque  Procedural Details: The right wrist was prepped, draped, and anesthetized with 1% lidocaine. Using the modified Seldinger technique, a 6 Fr  sheath was introduced into the radial artery. 3 mg verapamil was administered through the radial sheath. Weight-based bivalirudin was given for anticoagulation. Once a therapeutic ACT was achieved, a 6 [place] XB LAD 3.5 guide catheter was inserted. A run through coronary guidewire was used to cross the lesion in the proximal first diagonal. The lesion was predilated with a  2.0 x 12 mm balloon to 8 atmospheres balloon. The lesion was then stented with a 2.5 x 18 mm Xience drug-eluting stent. The stent was postdilated with a 2.75 x 15 mm noncompliant balloon. Following PCI, there was 0% residual stenosis and TIMI-3 flow. The wire was then removed and advanced into the left circumflex. The balloon was advanced to the midsegment to provide support. I was able to cross the lesion in the mid left circumflex without difficulty. The lesion was predilated with a 2.5 x 15 mm sprinter balloon. It was stented with a 2.5 x 28 mm Xience drug-eluting stent. This was post dilated with a 2.75 x 20 mm noncompliant balloon proximally. OM 2 was a jailed by the stent but this branch is overall small in size with TIMI-3 flow. Final angiography confirmed an excellent result. The patient tolerated the procedure well. There were no immediate procedural complications. A TR band was used for radial hemostasis. The patient was transferred to the post catheterization recovery area for further monitoring. the patient did have mild chest discomfort during angioplasty of the left circumflex which persisted for about 15 minutes after the procedure and resolved spontaneously.  PCI Data:  Vessel - first diagonal/Segment - proximal  Percent Stenosis (pre) 90%  TIMI-flow 3  Stent 2.5 x 18 mm Xience drug-eluting stent postdilated with a 2.75 noncompliant balloon  Percent Stenosis (post) 0%  TIMI-flow (post) 3  Vessel - left circumflex/Segment - mid  Percent Stenosis (pre) 95%  TIMI-flow 3  Stent 2.5 x 28 mm Xience drug-eluting stent postdilated with a 2.75 noncompliant balloon  Percent Stenosis (post) 0%  TIMI-flow (post) 3  Final Conclusions:  Successful angioplasty and drug-eluting stent placement to both first diagonal and mid left circumflex.  Recommendations:  Continue dual antiplatelet therapy for at least one year. There is a residual 40% distal left main stenosis and 50% ostial left circumflex  stenosis. Aggressive medical therapy is recommended.   Discharge Medications     Medication List    STOP taking these medications       amLODipine 10 MG tablet  Commonly known as:  NORVASC     isosorbide mononitrate 30 MG 24 hr tablet  Commonly known as:  IMDUR      TAKE these medications       aspirin 81 MG tablet  Take 81 mg by mouth daily.     buPROPion 150 MG 24 hr tablet  Commonly known as:  WELLBUTRIN XL  Take 150 mg by mouth daily.     carvedilol 6.25 MG tablet  Commonly known as:  COREG  Take 6.25 mg by mouth 2 (two) times daily with a meal.     losartan 50 MG tablet  Commonly known as:  COZAAR  Take 50 mg by mouth daily.     magnesium oxide 400 MG tablet  Commonly known as:  MAG-OX  Take 400 mg by mouth daily.     multivitamin capsule  Take 1 capsule by mouth daily.     nitroGLYCERIN 0.4 MG SL tablet  Commonly known as:  NITROSTAT  Place 0.4 mg under the tongue every 5 (five)  minutes as needed for chest pain.     prasugrel 10 MG Tabs tablet  Commonly known as:  EFFIENT  Take 10 mg by mouth daily.     rosuvastatin 5 MG tablet  Commonly known as:  CRESTOR  Take 5 mg by mouth daily.     TYLENOL PO  Take 1-2 tablets by mouth every 6 (six) hours as needed (pain).     VITAMIN D PO  Take 1 tablet by mouth daily.        Disposition   The patient will be discharged in stable condition to home. Discharge Orders   Future Appointments Provider Department Dept Phone   01/04/2014 3:00 PM Antonieta Ibaimothy J Gollan, MD [facility] [place] [phone]   Future Orders Complete By Expires   Amb Referral to Cardiac Rehabilitation  As directed          Duration of Discharge Encounter: Greater than 30 minutes including physician and PA time.  Signed, Thereasa ParkinKathryn Stern PA-C 12/24/2013, 1:29 PM

## 2013-12-24 NOTE — Discharge Summary (Signed)
Stable post 2 vessel PCI. BP was elevated - will need BP medications adjusted on ROV appt.  Ok for d/c & ROV with Dr. Mariah Milling.  Marykay Lex, MD

## 2013-12-24 NOTE — Discharge Instructions (Signed)

## 2013-12-24 NOTE — Progress Notes (Signed)
   Subjective:  Did fine last PM. Mild aching in chest initially post PCI. Has not walked yet. Has ?s re meds - have reviewed Dr. Windell Hummingbird note, no mention of holding Amlodipine.  Objective:  Vital Signs in the last 24 hours: Temp:  [97.5 F (36.4 C)-98.7 F (37.1 C)] 97.5 F (36.4 C) (04/09 0545) Pulse Rate:  [58-75] 59 (04/09 0545) Resp:  [16-20] 20 (04/09 0545) BP: (133-152)/(79-96) 141/85 mmHg (04/09 0545) SpO2:  [92 %-98 %] 95 % (04/09 0545) Weight:  [191 lb (86.637 kg)-192 lb 0.3 oz (87.1 kg)] 192 lb 0.3 oz (87.1 kg) (04/08 2325)  Intake/Output from previous day: 04/08 0701 - 04/09 0700 In: 1065 [P.O.:240; I.V.:825] Out: 1600 [Urine:1600] Intake/Output from this shift:    Physical Exam:\ General appearance: alert, cooperative, appears stated age and no distress Neck: no adenopathy, no carotid bruit, no JVD, supple, symmetrical, trachea midline and thyroid not enlarged, symmetric, no tenderness/mass/nodules Lungs: clear to auscultation bilaterally and normal percussion bilaterally Heart: regular rate and rhythm, S1, S2 normal, no murmur, click, rub or gallop Abdomen: soft, non-tender; bowel sounds normal; no masses,  no organomegaly Extremities: extremities normal, atraumatic, no cyanosis or edema and R Wrist C/D/I Pulses: 2+ and symmetric Neurologic: Grossly normal  Lab Results:  Recent Labs  12/23/13 1229 12/24/13 0507  WBC 6.8 8.8  HGB 14.1 14.2  PLT 285 262    Recent Labs  12/23/13 1229 12/24/13 0507  NA 142 143  K 3.7 3.2*  CL 101 101  CO2 26 25  GLUCOSE 143* 114*  BUN 20 12  CREATININE 0.98 0.79   Cardiac Studies: Cath/PCI: - Successful angioplasty and drug-eluting stent placement to both first diagonal (Xience DES 2.5 mm x 18 mm --2.75 mm) and mid left circumflex ( Xience DES 2.5 mm x 28 mm -- 2.75 mm)  Assessment/Plan:  Active Problems:   Coronary atherosclerosis of native coronary artery   Stented coronary artery - D1, mCx   NSTEMI (non-ST  elevated myocardial infarction)   Hyperlipemia   Essential hypertension   Angina effort  Doing well s/p Staged multivessel PCI after initial Dx cath @ [facility]. BP stable Will need to ambulate today & if stable, can d/c to home  Would continue BB, ARB, ASA/Effient as well as Crestor.  Hold Imdur & Amlodipine for now on d/c.  Can be readdressed in OP ROV already scheduled with Dr. Mariah Milling.   LOS: 1 day    Marykay Lex 12/24/2013, 7:08 AM

## 2014-01-04 ENCOUNTER — Ambulatory Visit (INDEPENDENT_AMBULATORY_CARE_PROVIDER_SITE_OTHER): Payer: BC Managed Care – PPO | Admitting: Cardiovascular Disease

## 2014-01-04 ENCOUNTER — Encounter: Payer: Self-pay | Admitting: Cardiovascular Disease

## 2014-01-04 VITALS — BP 160/100 | HR 60 | Ht 66.5 in | Wt 189.2 lb

## 2014-01-04 DIAGNOSIS — I10 Essential (primary) hypertension: Secondary | ICD-10-CM

## 2014-01-04 DIAGNOSIS — I2510 Atherosclerotic heart disease of native coronary artery without angina pectoris: Secondary | ICD-10-CM

## 2014-01-04 DIAGNOSIS — E785 Hyperlipidemia, unspecified: Secondary | ICD-10-CM

## 2014-01-04 MED ORDER — NITROGLYCERIN 0.4 MG SL SUBL: 0.4000 mg | SUBLINGUAL_TABLET | SUBLINGUAL | Status: DC | PRN

## 2014-01-04 MED ORDER — LOSARTAN POTASSIUM 100 MG PO TABS: 100.0000 mg | ORAL_TABLET | Freq: Every day | ORAL | Status: DC

## 2014-01-04 MED ORDER — AMLODIPINE BESYLATE 10 MG PO TABS: 10.0000 mg | ORAL_TABLET | Freq: Every day | ORAL | Status: DC

## 2014-01-04 MED ORDER — LOSARTAN POTASSIUM 100 MG PO TABS: 50.0000 mg | ORAL_TABLET | Freq: Every day | ORAL | Status: DC

## 2014-01-04 MED ORDER — PRASUGREL HCL 10 MG PO TABS: 10.0000 mg | ORAL_TABLET | Freq: Every day | ORAL | Status: DC

## 2014-01-04 MED ORDER — CARVEDILOL 6.25 MG PO TABS: 6.2500 mg | ORAL_TABLET | Freq: Two times a day (BID) | ORAL | Status: DC

## 2014-01-04 MED ORDER — ROSUVASTATIN CALCIUM 10 MG PO TABS: 10.0000 mg | ORAL_TABLET | Freq: Every day | ORAL | Status: DC

## 2014-01-04 NOTE — Progress Notes (Signed)
Patient ID: Clinton Dorsey, male    DOB: 07-19-1951, 63 y.o.   MRN: [id number]  HPI Comments: Clinton Dorsey is a very pleasant 63 year old gentleman with a history of hyperlipidemia, hypertension, prediabetes, strong family history of coronary artery disease, prior occasional tobacco abuse who [facility] 12/07/2013 with chest pain. He ruled in for non-ST elevation MI with CK-MB of 17.7, CK of 656.  Cardiac catheterization was performed that showed severe three-vessel CAD. He had stent for distal RCA/ostial PL branch disease estimated at 99%. He also had mid circumflex with 99% disease, severe diagonal #1 disease. Attempt to place a stent to the mid circumflex was unsuccessful and given that he had significant dye and radiation exposure, it was recommended that he have PCI of his left circumflex and diagonal vessel at a later date.   2 weeks ago he had catheterization at [place] with successful stents to his mid circumflex and first diagonal vessel In followup today, he feels well with no significant shortness of breath. He has not been monitoring his blood pressure at home. Some confusion about his medications. He would like to start cardiac rehabilitation. He is tolerating Crestor 5 mg daily   echocardiogram in the hospital showed ejection fraction 50-55%, mildly dilated left atrium, mild TR, low to moderate MR, no focal wall motion abnormalities noted  EKG in the hospital 12/08/2013 shows normal sinus rhythm with poor R-wave progression through the anterior precordial leads, PVC EKG today shows normal sinus rhythm with rate 60 beats per minute with nonspecific ST abnormality   Outpatient Encounter Prescriptions as of 01/04/2014  Medication Sig  . Acetaminophen (TYLENOL PO) Take 1-2 tablets by mouth every 6 (six) hours as needed (pain).  Marland Kitchen. amLODipine (NORVASC) 10 MG tablet Take 10 mg by mouth daily.  Marland Kitchen. aspirin 81 MG tablet Take 81 mg by mouth daily.  Marland Kitchen. buPROPion (WELLBUTRIN XL) 150  MG 24 hr tablet Take 150 mg by mouth daily.  . carvedilol (COREG) 6.25 MG tablet Take 6.25 mg by mouth 2 (two) times daily with a meal.  . Cholecalciferol (VITAMIN D PO) Take 1 tablet by mouth daily.  Marland Kitchen. losartan (COZAAR) 50 MG tablet Take 50 mg by mouth daily.  . Multiple Vitamin (MULTIVITAMIN) capsule Take 1 capsule by mouth daily.  . nitroGLYCERIN (NITROSTAT) 0.4 MG SL tablet Place 0.4 mg under the tongue every 5 (five) minutes as needed for chest pain.  Marland Kitchen. POTASSIUM PO Take 1 tablet by mouth daily. OTC  . prasugrel (EFFIENT) 10 MG TABS tablet Take 10 mg by mouth daily.  . rosuvastatin (CRESTOR) 5 MG tablet Take 5 mg by mouth daily.    Review of Systems  Constitutional: Negative.   HENT: Negative.   Eyes: Negative.   Respiratory: Negative.   Cardiovascular: Negative.   Gastrointestinal: Negative.   Endocrine: Negative.   Musculoskeletal: Negative.   Skin: Negative.   Allergic/Immunologic: Negative.   Neurological: Negative.   Hematological: Negative.   Psychiatric/Behavioral: Negative.   All other systems reviewed and are negative.   BP 160/100  Pulse 60  Ht 5' 6.5" (1.689 m)  Wt 189 lb 4 oz (85.843 kg)  BMI 30.09 kg/m2  Physical Exam  Nursing note and vitals reviewed. Constitutional: He is oriented to person, place, and time. He appears well-developed and well-nourished.  HENT:  Head: Normocephalic.  Nose: Nose normal.  Mouth/Throat: Oropharynx is clear and moist.  Eyes: Conjunctivae are normal. Pupils are equal, round, and reactive to light.  Neck: Normal range  of motion. Neck supple. No JVD present.  Cardiovascular: Normal rate, regular rhythm, S1 normal, S2 normal, normal heart sounds and intact distal pulses.  Exam reveals no gallop and no friction rub.   No murmur heard. Pulmonary/Chest: Effort normal and breath sounds normal. No respiratory distress. He has no wheezes. He has no rales. He exhibits no tenderness.  Abdominal: Soft. Bowel sounds are normal. He  exhibits no distension. There is no tenderness.  Musculoskeletal: Normal range of motion. He exhibits no edema and no tenderness.  Lymphadenopathy:    He has no cervical adenopathy.  Neurological: He is alert and oriented to person, place, and time. Coordination normal.  Skin: Skin is warm and dry. No rash noted. No erythema.  Psychiatric: He has a normal mood and affect. His behavior is normal. Judgment and thought content normal.      Assessment and Plan

## 2014-01-04 NOTE — Patient Instructions (Signed)
You are doing well. Please increase the losartan up to 100 mg daily, take in the pm Take coreg 6.25 mg twice a day Take amlodipine one pill in the AM Asa and effient once a day crestor to 10 mg once a day  Monitor your blood pressure at home Call for consistent pressures >140  Lipid panel in 3 months, fasting (mid July)  Please call us if you have new issues that need to be addressed before your next appt.  Your physician wants you to follow-up in: 6 months.  You will receive a reminder letter in the mail two months in advance. If you don't receive a letter, please call our office to schedule the follow-up appointment.

## 2014-01-04 NOTE — Assessment & Plan Note (Signed)
Blood pressure running high today. We'll increase his losartan to 100 mg daily. Encouraged him to check his blood pressure at home and call our office with numbers

## 2014-01-04 NOTE — Assessment & Plan Note (Signed)
Successful stenting to his distal RCA, circumflex and diagonal vessel. We will schedule him for cardiac rehabilitation. Continue aspirin and effient.

## 2014-01-04 NOTE — Assessment & Plan Note (Signed)
Crestor increased up to 10 mg daily. Repeat lipid panel in 3 months

## 2014-01-13 ENCOUNTER — Encounter: Payer: Self-pay | Admitting: Cardiovascular Disease

## 2014-01-15 ENCOUNTER — Encounter: Payer: Self-pay | Admitting: Cardiovascular Disease

## 2014-02-15 ENCOUNTER — Encounter: Payer: Self-pay | Admitting: Cardiovascular Disease

## 2014-03-17 ENCOUNTER — Encounter: Payer: Self-pay | Admitting: Cardiovascular Disease

## 2014-04-05 ENCOUNTER — Ambulatory Visit (INDEPENDENT_AMBULATORY_CARE_PROVIDER_SITE_OTHER): Payer: BC Managed Care – PPO | Admitting: *Deleted

## 2014-04-05 DIAGNOSIS — I25119 Atherosclerotic heart disease of native coronary artery with unspecified angina pectoris: Secondary | ICD-10-CM

## 2014-04-05 DIAGNOSIS — I2510 Atherosclerotic heart disease of native coronary artery without angina pectoris: Secondary | ICD-10-CM

## 2014-04-05 DIAGNOSIS — E785 Hyperlipidemia, unspecified: Secondary | ICD-10-CM

## 2014-04-05 DIAGNOSIS — I209 Angina pectoris, unspecified: Secondary | ICD-10-CM

## 2014-04-06 LAB — LIPID PANEL
Chol/HDL Ratio: 3.6 ratio units (ref 0.0–5.0)
Cholesterol, Total: 128 mg/dL (ref 100–199)
HDL: 36 mg/dL — ABNORMAL LOW (ref >39)
LDL Calculated: 61 mg/dL (ref 0–99)
Triglycerides: 156 mg/dL — ABNORMAL HIGH (ref 0–149)
VLDL Cholesterol Cal: 31 mg/dL (ref 5–40)

## 2014-04-06 LAB — HEPATIC FUNCTION PANEL
ALT: 48 IU/L — ABNORMAL HIGH (ref 0–44)
AST: 50 IU/L — ABNORMAL HIGH (ref 0–40)
Albumin: 4.6 g/dL (ref 3.6–4.8)
Alkaline Phosphatase: 68 IU/L (ref 39–117)
Bilirubin, Direct: 0.18 mg/dL (ref 0.00–0.40)
Total Bilirubin: 0.5 mg/dL (ref 0.0–1.2)
Total Protein: 6.8 g/dL (ref 6.0–8.5)

## 2014-04-17 ENCOUNTER — Encounter: Payer: Self-pay | Admitting: Cardiovascular Disease

## 2014-05-18 ENCOUNTER — Encounter: Payer: Self-pay | Admitting: Cardiovascular Disease

## 2014-06-17 ENCOUNTER — Encounter: Payer: Self-pay | Admitting: Cardiovascular Disease

## 2014-06-30 ENCOUNTER — Telehealth: Payer: Self-pay

## 2014-06-30 NOTE — Telephone Encounter (Signed)
It is too early to stop his medications for the stent, asa and effient He could try a saline nasal spray Could consider scheduling a visit with ear nose throat. They could perhaps cauterize any friable vessel. Ideally we used the thinners for at least one year

## 2014-06-30 NOTE — Telephone Encounter (Signed)
Pt states last night he had a bad nose bleed and took most of the night to stop, states he has had this happen before. States he is on aspirin and Effient.

## 2014-06-30 NOTE — Telephone Encounter (Signed)
Pt has an appt for 6 mo f/u on 07/13/14. Do you want to make any changes to his blood thinners before that time?

## 2014-07-01 NOTE — Telephone Encounter (Signed)
Spoke w/ pt.  Advised him of Dr. Gollan's recommendation.  He verbalizes understanding and will call back w/ any questions or concerns.  

## 2014-07-13 ENCOUNTER — Ambulatory Visit (INDEPENDENT_AMBULATORY_CARE_PROVIDER_SITE_OTHER): Payer: BC Managed Care – PPO | Admitting: Cardiovascular Disease

## 2014-07-13 ENCOUNTER — Encounter: Payer: Self-pay | Admitting: Cardiovascular Disease

## 2014-07-13 VITALS — BP 160/100 | HR 56 | Ht 66.5 in | Wt 188.5 lb

## 2014-07-13 DIAGNOSIS — I10 Essential (primary) hypertension: Secondary | ICD-10-CM

## 2014-07-13 DIAGNOSIS — I25119 Atherosclerotic heart disease of native coronary artery with unspecified angina pectoris: Secondary | ICD-10-CM

## 2014-07-13 DIAGNOSIS — E785 Hyperlipidemia, unspecified: Secondary | ICD-10-CM

## 2014-07-13 DIAGNOSIS — R5383 Other fatigue: Secondary | ICD-10-CM

## 2014-07-13 NOTE — Patient Instructions (Addendum)
Your next appointment will be scheduled in our new office located at :  [facility]  [address], [address]  [place], [place] [address]  You are doing well. No medication changes were made.  Please call us if you have new issues that need to be addressed before your next appt.  Your physician wants you to follow-up in: 6 months.  You will receive a reminder letter in the mail two months in advance. If you don't receive a letter, please call our office to schedule the follow-up appointment.   

## 2014-07-13 NOTE — Assessment & Plan Note (Signed)
Cholesterol is at goal on the current lipid regimen. No changes to the medications were made.  

## 2014-07-13 NOTE — Assessment & Plan Note (Signed)
Blood pressure is elevated in the office today, improved on recheck. Encouraged him to monitor his blood pressure at home and call us if numbers run high

## 2014-07-13 NOTE — Progress Notes (Signed)
Patient ID: Clinton Dorsey, male    DOB: [birth year], 63 y.o.   MRN: [id number]  HPI Comments: Clinton Dorsey is a very pleasant 63 year old gentleman with a history of hyperlipidemia, hypertension, prediabetes, strong family history of coronary artery disease, prior occasional tobacco abuse who [facility] 12/07/2013 with chest pain. He ruled in for non-ST elevation MI with CK-MB of 17.7, CK of 656.  Cardiac catheterization was performed that showed severe three-vessel CAD. He had stent for distal RCA/ostial PL branch disease estimated at 99%. He also had mid circumflex with 99% disease, severe diagonal #1 disease. Attempt to place a stent to the mid circumflex was unsuccessful and given that he had significant dye and radiation exposure, it was recommended that he have PCI of his left circumflex and diagonal vessel at a later date. Followup catheterization at [place] with successful stents to his mid circumflex and first diagonal vessel  In general he reports that he is doing well. Significant stress with his job, poor sleep, averaging only 5 hours per night. Significant fatigue He reports his blood pressure is well controlled at home with systolic pressure typically 120s. He did cardiac rehabilitation with close monitoring of his blood pressure and was told it was well controlled He denies any shortness of breath or chest pain with exertion.   echocardiogram in the hospital showed ejection fraction 50-55%, mildly dilated left atrium, mild TR, low to moderate MR, no focal wall motion abnormalities noted  EKG shows normal sinus rhythm with rate 63 beats per minute, left axis deviation   Outpatient Encounter Prescriptions as of 07/13/2014  Medication Sig  . amLODipine (NORVASC) 10 MG tablet Take 1 tablet (10 mg total) by mouth daily.  Marland Kitchen. aspirin 81 MG tablet Take 81 mg by mouth daily.  Marland Kitchen. buPROPion (WELLBUTRIN XL) 150 MG 24 hr tablet Take 150 mg by mouth daily.  . carvedilol (COREG) 6.25  MG tablet Take 1 tablet (6.25 mg total) by mouth 2 (two) times daily with a meal.  . Cholecalciferol (VITAMIN D PO) Take 2 tablets by mouth daily.   . hydrochlorothiazide (MICROZIDE) 12.5 MG capsule Take 12.5 mg by mouth daily.   Marland Kitchen. losartan (COZAAR) 100 MG tablet Take 1 tablet (100 mg total) by mouth daily.  . magnesium oxide (MAG-OX) 400 MG tablet Take 400 mg by mouth daily.  . Multiple Vitamin (MULTIVITAMIN) capsule Take 1 capsule by mouth daily.  . nitroGLYCERIN (NITROSTAT) 0.4 MG SL tablet Place 1 tablet (0.4 mg total) under the tongue every 5 (five) minutes as needed for chest pain.  Marland Kitchen. POTASSIUM PO Take 1 tablet by mouth every other day. OTC  . prasugrel (EFFIENT) 10 MG TABS tablet Take 1 tablet (10 mg total) by mouth daily.  . rosuvastatin (CRESTOR) 10 MG tablet Take 20 mg by mouth daily.  Marland Kitchen. ZETIA 10 MG tablet Take 10 mg by mouth daily.    Review of Systems  Constitutional: Negative.   HENT: Negative.   Eyes: Negative.   Respiratory: Negative.   Cardiovascular: Negative.   Gastrointestinal: Negative.   Endocrine: Negative.   Musculoskeletal: Negative.   Skin: Negative.   Allergic/Immunologic: Negative.   Neurological: Negative.   Hematological: Negative.   Psychiatric/Behavioral: Negative.   All other systems reviewed and are negative.   BP 160/100  Pulse 56  Ht 5' 6.5" (1.689 m)  Wt 188 lb 8 oz (85.503 kg)  BMI 29.97 kg/m2  repeat blood pressure 140 systolic over 90 Physical Exam  Nursing note and  vitals reviewed. Constitutional: He is oriented to person, place, and time. He appears well-developed and well-nourished.  HENT:  Head: Normocephalic.  Nose: Nose normal.  Mouth/Throat: Oropharynx is clear and moist.  Eyes: Conjunctivae are normal. Pupils are equal, round, and reactive to light.  Neck: Normal range of motion. Neck supple. No JVD present.  Cardiovascular: Normal rate, regular rhythm, S1 normal, S2 normal, normal heart sounds and intact distal pulses.  Exam  reveals no gallop and no friction rub.   No murmur heard. Pulmonary/Chest: Effort normal and breath sounds normal. No respiratory distress. He has no wheezes. He has no rales. He exhibits no tenderness.  Abdominal: Soft. Bowel sounds are normal. He exhibits no distension. There is no tenderness.  Musculoskeletal: Normal range of motion. He exhibits no edema and no tenderness.  Lymphadenopathy:    He has no cervical adenopathy.  Neurological: He is alert and oriented to person, place, and time. Coordination normal.  Skin: Skin is warm and dry. No rash noted. No erythema.  Psychiatric: He has a normal mood and affect. His behavior is normal. Judgment and thought content normal.      Assessment and Plan

## 2014-07-13 NOTE — Assessment & Plan Note (Signed)
Currently with no symptoms of angina. No further workup at this time. Continue current medication regimen. 

## 2014-07-18 ENCOUNTER — Encounter: Payer: Self-pay | Admitting: Cardiovascular Disease

## 2014-08-26 ENCOUNTER — Encounter (HOSPITAL_COMMUNITY): Payer: Self-pay | Admitting: Cardiovascular Disease

## 2014-11-13 ENCOUNTER — Other Ambulatory Visit: Payer: Self-pay | Admitting: Cardiovascular Disease

## 2014-12-20 ENCOUNTER — Encounter: Payer: Self-pay | Admitting: Cardiovascular Disease

## 2014-12-20 ENCOUNTER — Ambulatory Visit (INDEPENDENT_AMBULATORY_CARE_PROVIDER_SITE_OTHER): Payer: BLUE CROSS/BLUE SHIELD | Admitting: Cardiovascular Disease

## 2014-12-20 VITALS — BP 158/100 | HR 53 | Ht 66.0 in | Wt 183.0 lb

## 2014-12-20 DIAGNOSIS — E785 Hyperlipidemia, unspecified: Secondary | ICD-10-CM | POA: Diagnosis not present

## 2014-12-20 DIAGNOSIS — I10 Essential (primary) hypertension: Secondary | ICD-10-CM | POA: Diagnosis not present

## 2014-12-20 DIAGNOSIS — I25119 Atherosclerotic heart disease of native coronary artery with unspecified angina pectoris: Secondary | ICD-10-CM | POA: Diagnosis not present

## 2014-12-20 DIAGNOSIS — Z955 Presence of coronary angioplasty implant and graft: Secondary | ICD-10-CM

## 2014-12-20 DIAGNOSIS — I214 Non-ST elevation (NSTEMI) myocardial infarction: Secondary | ICD-10-CM

## 2014-12-20 NOTE — Assessment & Plan Note (Signed)
Recommended he restart Crestor 10 mg daily if tolerated. He reports having memory problems on 20 mg Stressed importance of aggressive cholesterol control

## 2014-12-20 NOTE — Patient Instructions (Addendum)
You are doing well.  Move either the amlodipine or losartan to the evening Please monitor your blood pressure Call the office if it runs high  Please retry crestor 10 mg daily  Please call us if you have new issues that need to be addressed before your next appt.  Your physician wants you to follow-up in: 6 months.  You will receive a reminder letter in the mail two months in advance. If you don't receive a letter, please call our office to schedule the follow-up appointment.

## 2014-12-20 NOTE — Assessment & Plan Note (Signed)
Recommended he stay on his aspirin and effient

## 2014-12-20 NOTE — Assessment & Plan Note (Signed)
Recommended he move his amlodipine or losartan to the evening, and continue to closely monitor his blood pressure. Suggested he call he office for frequent systolic pressures more them 140

## 2014-12-20 NOTE — Assessment & Plan Note (Signed)
Currently with no symptoms of angina. No further workup at this time. Continue current medication regimen. 

## 2014-12-20 NOTE — Progress Notes (Signed)
Patient ID: Clinton Dorsey, male    DOB: 1951-02-07, 64 y.o.   MRN: [id number]  HPI Comments: Clinton Dorsey is a very pleasant 64 year old gentleman with a history of hyperlipidemia, hypertension, prediabetes, strong family history of coronary artery disease, prior occasional tobacco abuse who presented to [facility] 12/07/2013 with chest pain. He ruled in for non-ST elevation MI with CK-MB of 17.7, CK of 656. He presents today for follow-up of his coronary artery disease  He reports that he has stopped his Crestor. He was concerned about memory loss. Without the Crestor, he feels his memory has improved. Blood pressure elevated on today's visit. He has not been monitoring his blood pressure at home Denies any chest pain or shortness of breath with exertion Significant stress with his job, poor sleep, averaging only 5 hours per night.   EKG shows normal sinus rhythm with rate 53 beats per minute, left axis deviation, poor R-wave progression to the anterior precordial leads, consider old anterior MI  Other past medical history Cardiac catheterization was performed that showed severe three-vessel CAD. He had stent for distal RCA/ostial PL branch disease estimated at 99%. He also had mid circumflex with 99% disease, severe diagonal #1 disease. Attempt to place a stent to the mid circumflex was unsuccessful and given that he had significant dye and radiation exposure, it was recommended that he have PCI of his left circumflex and diagonal vessel at a later date. Followup catheterization at [place] with successful stents to his mid circumflex and first diagonal vessel   echocardiogram in the hospital showed ejection fraction 50-55%, mildly dilated left atrium, mild TR, low to moderate MR, no focal wall motion abnormalities noted    Allergies  Allergen Reactions  . Lipitor [Atorvastatin]     Myalgias   . Penicillins     Rash   . Zocor [Simvastatin]     Myalgias     Outpatient Encounter  Prescriptions as of 12/20/2014  Medication Sig  . amLODipine (NORVASC) 10 MG tablet TAKE 1 TABLET (10 MG TOTAL) BY MOUTH DAILY.  Marland Kitchen aspirin 81 MG tablet Take 81 mg by mouth daily.  Marland Kitchen buPROPion (WELLBUTRIN XL) 150 MG 24 hr tablet Take 150 mg by mouth daily.  . carvedilol (COREG) 6.25 MG tablet TAKE 1 TABLET (6.25 MG TOTAL) BY MOUTH 2 (TWO) TIMES DAILY WITH A MEAL.  Marland Kitchen Cholecalciferol (VITAMIN D PO) Take 2 tablets by mouth daily.   Marland Kitchen ezetimibe (ZETIA) 10 MG tablet Take 10 mg by mouth daily.  . hydrochlorothiazide (MICROZIDE) 12.5 MG capsule Take 12.5 mg by mouth daily.   Marland Kitchen losartan (COZAAR) 100 MG tablet TAKE 1 TABLET (100 MG TOTAL) BY MOUTH DAILY.  . magnesium oxide (MAG-OX) 400 MG tablet Take 400 mg by mouth daily.  . Multiple Vitamin (MULTIVITAMIN) capsule Take 1 capsule by mouth daily.  . nitroGLYCERIN (NITROSTAT) 0.4 MG SL tablet Place 1 tablet (0.4 mg total) under the tongue every 5 (five) minutes as needed for chest pain.  . prasugrel (EFFIENT) 10 MG TABS tablet Take 1 tablet (10 mg total) by mouth daily.  . [DISCONTINUED] POTASSIUM PO Take 1 tablet by mouth every other day. OTC  . [DISCONTINUED] ZETIA 10 MG tablet Take 10 mg by mouth daily.     Past Medical History  Diagnosis Date  . Hypertension   . Hyperlipidemia   . Prediabetes   . Depression   . Obesity   . Coronary artery disease     a. 12/23/13 s/p PCI w/ DES  to both D1 and mLCx    Past Surgical History  Procedure Laterality Date  . Laminectomy    . Hernia repair    . Tonsillectomy    . Cardiac catheterization  12/08/2013  . Coronary angioplasty  12/08/2013    s/p stent placement  . Percutaneous coronary stent intervention (pci-s) N/A 12/23/2013    Procedure: PERCUTANEOUS CORONARY STENT INTERVENTION (PCI-S);  Surgeon: [name], MD;  Location: [facility] CATH LAB;  Service: Cardiovascular;  Laterality: N/A;    Social History  reports that he quit smoking about a year ago. His smoking use included Cigars. He does not have  any smokeless tobacco history on file. He reports that he drinks about 0.6 oz of alcohol per week. He reports that he does not use illicit drugs.  Family History family history includes Heart attack (age of onset: 37) in his father; Heart disease in his father; Hypertension in his mother and sister.       Review of Systems  Constitutional: Negative.   Respiratory: Negative.   Cardiovascular: Negative.   Gastrointestinal: Negative.   Musculoskeletal: Negative.   Skin: Negative.   Neurological: Negative.   Hematological: Negative.   Psychiatric/Behavioral: Negative.   All other systems reviewed and are negative.   BP 158/100 mmHg  Pulse 53  Ht 5\' 6"  (1.676 m)  Wt 183 lb (83.008 kg)  BMI 29.55 kg/m2  Physical Exam  Constitutional: He is oriented to person, place, and time. He appears well-developed and well-nourished.  HENT:  Head: Normocephalic.  Nose: Nose normal.  Mouth/Throat: Oropharynx is clear and moist.  Eyes: Conjunctivae are normal. Pupils are equal, round, and reactive to light.  Neck: Normal range of motion. Neck supple. No JVD present.  Cardiovascular: Normal rate, regular rhythm, S1 normal, S2 normal, normal heart sounds and intact distal pulses.  Exam reveals no gallop and no friction rub.   No murmur heard. Pulmonary/Chest: Effort normal and breath sounds normal. No respiratory distress. He has no wheezes. He has no rales. He exhibits no tenderness.  Abdominal: Soft. Bowel sounds are normal. He exhibits no distension. There is no tenderness.  Musculoskeletal: Normal range of motion. He exhibits no edema or tenderness.  Lymphadenopathy:    He has no cervical adenopathy.  Neurological: He is alert and oriented to person, place, and time. Coordination normal.  Skin: Skin is warm and dry. No rash noted. No erythema.  Psychiatric: He has a normal mood and affect. His behavior is normal. Judgment and thought content normal.      Assessment and Plan   Nursing  note and vitals reviewed.

## 2015-01-08 NOTE — H&P (Signed)
PATIENT NAME:  Clinton Dorsey, Clinton Dorsey MR#:  [id number] DATE OF BIRTH:  1950/11/25  DATE OF ADMISSION:  12/07/2013  PRIMARY CARE PHYSICIAN: Dr. Jeananne Rama.   CHIEF COMPLAINT: Chest pain/indigestion.   HISTORY OF PRESENT ILLNESS: This is a very pleasant 63 year old male with a history of hypertension, prediabetes, hyperlipidemia who presents with the above complaint. The patient says a week ago, he developed this kind of indigestion substernally. It went up to his throat and lasted about 20 or 30 minutes and went away. He did not have any symptoms all week. This morning, he woke up about 4:00 with the same discomfort. He took some antacid and it was better, so he went back to sleep. When he woke up, he was getting ready for work, and he just felt this intensity of this pain on his chest going up to his throat with pretty severe, about 9/10. It is worse with exertion and better with breath. It lasted about 30 minutes. No other symptoms associated with it.   REVIEW OF SYSTEMS:  CONSTITUTIONAL: No fever, fatigue, or weakness. EYES: No blurry or double vision, glaucoma, or cataracts. ENT: No ear pain, hearing loss, or seasonal allergy. RESPIRATORY: No cough, wheezing, hemoptysis, or COPD.  CARDIOVASCULAR: Positive chest pain. Positive indigestion. No palpitations, syncope, edema.  GASTROINTESTINAL: No nausea, vomiting, diarrhea, abdominal pain, melena, or ulcers. GENITOURINARY: No dysuria or hematuria.  ENDOCRINE: No polyuria, polydipsia. HEMOLYMPHATIC:  No anemia or easy bruising.  SKIN: No rashes or lesions.  MUSCULOSKELETAL: No pain in his knees or shoulders.  NEUROLOGIC: No history of CVA, TIA, or seizures.  PSYCHIATRIC: No history of anxiety.   PAST MEDICAL HISTORY:  1.  Hypertension.  2.  Hyperlipidemia.  3.  Prediabetes.  4.  Depression.   PAST SURGICAL HISTORY:  1.  Laminectomy.  2.  Hernia repair.  3.  Tonsillectomy.   MEDICATIONS:  1.  Multivitamin 1 tablet daily.  2.  Labetalol 300 mg  b.i.d.  3.  Hydrochlorothiazide telmisartan 12.5/80 daily.  4.  Bupropion 150 mg daily.  5.  Aspirin 81 mg daily.  6.  Norvasc 10 mg daily.   ALLERGIES: PENICILLIN CAUSES A RASH.   SOCIAL HISTORY: The patient smokes a cigar occasionally. He drinks wine daily. No IV drug use.   FAMILY HISTORY: Father had a heart attack at age 26, died of massive heart attack at age 1, and mother had a CVA at 83.   PHYSICAL EXAMINATION:  VITAL SIGNS: Temperature 97.8, pulse 70, respirations 18, blood pressure 141/78, 97% on room air.  GENERAL: Alert, oriented x3.  HEENT: Head is atraumatic. Pupils are round and reactive. Sclerae anicteric. Mucous membranes are moist.  OROPHARYNX: Clear.  NECK: Supple without JVD, carotid bruit or enlarged thyroid.  CARDIOVASCULAR: Regular rate and rhythm. No murmurs, gallops, or rubs. PMI is not displaced.   LUNGS: Clear to auscultation without crackles, rales, rhonchi, or wheezing. Normal percussion. Normal chest expansion.  BACK: No CVA or vertebral tenderness. He does have a scar from back surgery.  ABDOMEN: Bowel sounds are positive and nontender, nondistended. No hepatosplenomegaly.  EXTREMITIES: No clubbing, cyanosis, or edema.  NEUROLOGIC: Cranial nerves II through XII grossly intact. No focal deficits.  SKIN: Without rash or lesions.   LABORATORIES: Initial troponin less than 0.02 and four hours later, the troponin was 0.07. CPK-MB 10.8. CK 657, CPK-MB 12.7. White blood cells 6.4, hemoglobin 14, hematocrit 41. Platelets are 220. Sodium 139, potassium 3.1, chloride 102, bicarbonate 30, BUN 24, creatinine 0.98. Glucose is 187. ALT  57, AST 34, bilirubin 0.6, calcium 9.0, alk phos 74. BNP 197.   Chest x-ray shows no acute cardiopulmonary disease.   EKG shows Q waves in the septal leads.   ASSESSMENT AND PLAN: This is a 62-year-old male who presented a week ago with some indigestion and chest pain who presents again with chest pain, worse with exertion, better with  rest, and elevated troponins consistent with a non-ST-elevation myocardial infarction.  1.  Non-ST-elevation myocardial infarction. The patient will be admitted to telemetry. [facility] has been consulted as per the request of the patient. Will continue to cycle his cardiac enzymes. He will continue on aspirin. HE CANNOT TOLERATE ANY KIND OF STATIN SO WE WILL NOT ADD THAT MEDICATION AT THIS TIME. We can check fasting lipids, however, and possibly use fish oil if needed. The patient is on a full-dose Lovenox. We will continue with his outpatient medications including labetalol at this time.  2.  Hypertension. We will continue Norvasc, labetalol, and his hydrochlorothiazide losartan..  3.  Depression. Continue bupropion.  4.  Hypokalemia. Will replete potassium.  5.  Tobacco dependence. The patient smokes 1 cigar occasionally. We did discuss if the patient wants to quit. The patient can quit at any time. He just does this does occasionally. The patient was counseled for 3-1/2 minutes.   CODE STATUS: FULL CODE.   TIME SPENT: 45 minutes again   ____________________________ Sital P. Mody, MD spm:np D: 12/07/2013 14:43:25 ET T: 12/07/2013 15:09:07 ET JOB#: [id number]  cc: Sital P. Mody, MD, <Dictator> Mark A. Crissman, MD SITAL P MODY MD ELECTRONICALLY SIGNED 12/07/2013 17:43 

## 2015-01-08 NOTE — Consult Note (Signed)
General Aspect Clinton Dorsey is a 64yo Caucasian male w/ no prior cardiac history, PMHx s/f HLD, HTN, obesity, prediabetes, family history of CAD, occasional tobacco abuse and statin intolerance who was admitted to [facility] today for NSTEMI. Cardiology consulted for the same.   He reports no prior cardiac history. He has seen a cardiologist for management of hypertension. He denies prior stress tests, cardiac caths or cardiac surgery.   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 He was moving boxes several days ago without incident. He awoke ~4AM this morning experiencing substernal chest burning radiating to his neck reminiscent of indigestion initially. He tooks Tums with some relief; however the discomfort persisted and worsened with exertion (7/10). His wife checked his pulse which felt "irregular." No palpitations. He became concerned that it was his heart and sought medical attention at [facility] ED.   He reports his father had a MI at 58yo requiring CABG. Mother had CHF in her 12s. He smokes cigars on occasion. He has been intolerant to Crestor, Lipitor and Zocor due to myalgias. He is on several antihypertensives. BP well controlled at home (140/80s).   He denies exertional chest discomfort preceding today. No SOB/DOE, PND, orthopnea, LE edema or syncope.   Present Illness In the ED, EKG revealed incomplete RBBB, subtle TW changes in V1, V2, inferior Q waves vs IVCD. Initial TnI returned WNL, however a second marker returned elevated at 0.07. CMP- K 3.1. BNP 197. CXR- heart size upper limit of normal, otherwise no acute process. CBC WNL.   LDL 104, HDL 33, TG 224, TC 182.  He was admitted by the medicine team. ACS dose Lovenox was ordered. He has a received a full-dose ASA.  PAST MEDICAL HISTORY:  1.  Hypertension.  2.  Hyperlipidemia.  3.  Prediabetes.  4.  Depression.  5.  Obesity  PAST SURGICAL HISTORY:  1.  Laminectomy.  2.  Hernia repair.  3.  Tonsillectomy.    ALLERGIES: PENICILLIN CAUSES A RASH.   SOCIAL HISTORY: The patient smokes a cigar occasionally. He drinks wine daily. No IV drug use.   FAMILY HISTORY: Father had a heart attack at age 80, died of massive heart attack at age 3, and mother had a CVA at 65.   Physical Exam:  GEN no acute distress, obese   HEENT pink conjunctivae, PERRL, hearing intact to voice   NECK supple  No masses  trachea midline  no JVD or bruits   Review of Systems:  Subjective/Chief Complaint chest pain   General: Fatigue   Cardiovascular: Chest pain or discomfort   Review of Systems: All other systems were reviewed and found to be negative   Home Medications: Medication Instructions Status  hydrochlorothiazide-telmisartan 12.5 mg-80 mg oral tablet 1 tab(s) orally once a day Active  labetalol 300 mg oral tablet 1 tab(s) orally 2 times a day Active  amLODIPine 10 mg oral tablet 1 tab(s) orally once a day Active  buPROPion 150 mg/24 hours oral tablet, extended release 1 tab(s) orally every 24 hours Active  multivitamin 1 tab(s) orally once a day Active  aspirin 81 mg oral tablet 1 tab(s) orally once a day Active   Lab Results:  Hepatic:  23-Mar-15 09:56   Bilirubin, Total 0.6  Alkaline Phosphatase 74 (45-117 NOTE: New Reference Range 08/07/13)  SGPT (ALT) 57  SGOT (AST) 34  Total Protein, Serum 7.7  Albumin, Serum 4.1  Routine Chem:  23-Mar-15 09:56   Cholesterol, Serum 182  Triglycerides, Serum  224  HDL (INHOUSE)  33  VLDL Cholesterol Calculated  45  LDL Cholesterol Calculated  104 (Result(s) reported on 07 Dec 2013 at 03:13PM.)  Glucose, Serum  187  BUN  24  Creatinine (comp) 0.98  Sodium, Serum 139  Potassium, Serum  3.1  Chloride, Serum 102  CO2, Serum 30  Calcium (Total), Serum 9.0  Osmolality (calc) 287  eGFR (African American) >60  eGFR (Non-African American) >60 (eGFR values <38m/min/1.73 m2 may be an indication of chronic kidney disease (CKD). Calculated eGFR is  useful in patients with stable renal function. The eGFR calculation will not be reliable in acutely ill patients when serum creatinine is changing rapidly. It is not useful in  patients on dialysis. The eGFR calculation may not be applicable to patients at the low and high extremes of body sizes, pregnant women, and vegetarians.)  Anion Gap 7  B-Type Natriuretic Peptide ([facility])  197 (Result(s) reported on 07 Dec 2013 at 11:35AM.)    13:00   Result Comment troponin - RESULTS VERIFIED BY REPEAT TESTING.  - READ-BACK PROCESS PERFORMED.  - c/linda mclamb,rn 11/29/2013 @ [id number]  Result(s) reported on 07 Dec 2013 at 01:50PM.  Cardiac:  23-Mar-15 09:56   CPK-MB, Serum  12.7 (Result(s) reported on 07 Dec 2013 at 11:35AM.)  Troponin I < 0.02 (0.00-0.05 0.05 ng/mL or less: NEGATIVE  Repeat testing in 3-6 hrs  if clinically indicated. >0.05 ng/mL: POTENTIAL  MYOCARDIAL INJURY. Repeat  testing in 3-6 hrs if  clinically indicated. NOTE: An increase or decrease  of 30% or more on serial  testing suggests a  clinically important change)  CK, Total  656 (39-308 NOTE: NEW REFERENCE RANGE  10/19/2013)    13:00   CPK-MB, Serum  10.8 (Result(s) reported on 07 Dec 2013 at 01:45PM.)  Troponin I  0.07 (0.00-0.05 0.05 ng/mL or less: NEGATIVE  Repeat testing in 3-6 hrs  if clinically indicated. >0.05 ng/mL: POTENTIAL  MYOCARDIAL INJURY. Repeat  testing in 3-6 hrs if  clinically indicated. NOTE: An increase or decrease  of 30% or more on serial  testing suggests a  clinically important change)  Routine Hem:  23-Mar-15 09:56   WBC (CBC) 6.4  RBC (CBC) 4.69  Hemoglobin (CBC) 14.0  Hematocrit (CBC) 40.7  Platelet Count (CBC) 220 (Result(s) reported on 07 Dec 2013 at 10:19AM.)  MCV 87  MCH 29.8  MCHC 34.4  RDW 13.5   EKG:  Interpretation NSR, incomplete RBBB, IVCD vs Q waves III, aVF, LAD, TWI/blunting V1, V2, no ST changes   Rate 66   EKG Comparision no prior for comparison    Radiology Results: XRay:    23-Mar-15 09:48, Chest PA and Lateral  Chest PA and Lateral   REASON FOR EXAM:    Chest Pain  COMMENTS:       PROCEDURE: DXR - DXR CHEST PA (OR AP) AND LATERAL  - Dec 07 2013  9:48AM     CLINICAL DATA:  Chest pain    EXAM:  CHEST  2 VIEW    COMPARISON:  None.    FINDINGS:  Heart is upper limits normal in size. Lungs are clear. No effusions.  No acute bony abnormality.   IMPRESSION:  No active cardiopulmonary disease.      Electronically Signed    By: KRolm BaptiseM.D.    On: 12/07/2013 10:10         Verified By: KRaelyn Number M.D.,    Penicillin: Rash  Impression 1. NSTEMI The patient describeds experiencing sudden onset substernal chest burning radiating to his neck, worse on exertion beginning ~4AM today. No prior cardiac history. Cardiac RFs include obesity, HTN, HLD, prediabetes meeting criteria for metabolic syndrome. Father had a MI at 33yo. Objectively, EKG reveals subtle nonspecific findings-- question inferior Q waves. Initial TnI WNL, however a second marker is mildly elevated. The chest pain subsided in the ED, but recurred mildly with exertion to the restroom. Currently pain free. Symptoms and troponin elevation consistent w/ NSTEMI.  -- Plan cardiac catheterization tomorrow AM -- Obtain 2D echo -- Heparin, low-dose ASA, BB, NTG SL PRN -- Hold statin given h/o myalgias -- Check TSH  2. Hyperlipidemia LDL 104, HDL 33.  Statin intolerant. -- Diet & exercise recommended  3. Hypertension BP 180/94 on ED arrival. 140/80s at home. Better controlled currently. -- Continue amlodipine, BB, ARB -- Hold HCTZ  4. Hypokalemia 3.1 on CMP -- Replete -- Hold HCTZ  5. Prediabetes -- Check Hgb A1C -- Diet & exercise  6. Snoring -- Obtain outpatient sleep study   Electronic Signatures for Addendum Section:  Kathlyn Sacramento (MD) (Signed Addendum 23-Mar-15 17:25)  The patient was seen and examined. Agree with the above. He  presented with substernal chest tightness different from prior GERD.This lasted almost all day long. No previous cardiac history. No murmurs by exam. TnI is mildly elevated. ECG with inferior and septal Q waves.  The presentation is consistent with a small NSTEMI.  Recommend: Heparin drip, continue Aspirin.  Echo.  Cardiac cath tomorrow if cath lab schedule allows.   Electronic Signatures: Meriel Pica (PA-C)  (Signed 23-Mar-15 16:41)  Authored: General Aspect/Present Illness, History and Physical Exam, Review of System, Home Medications, Labs, EKG , Radiology, Allergies, Impression/Plan Kathlyn Sacramento (MD)  (Signed 23-Mar-15 17:25)  Co-Signer: General Aspect/Present Illness, History and Physical Exam, Review of System, Home Medications, Labs, EKG , Radiology, Allergies, Impression/Plan   Last Updated: 23-Mar-15 17:25 by Kathlyn Sacramento (MD)

## 2015-01-08 NOTE — Discharge Summary (Signed)
PATIENT NAME:  Clinton Dorsey, Clinton Dorsey MR#:  [id number] DATE OF BIRTH:  Feb 08, 1951  DATE OF ADMISSION:  12/07/2013 DATE OF DISCHARGE:  12/09/2013  PRIMARY CARE PHYSICIAN: Dr. Vonita Moss.   FINAL DIAGNOSES:  1. Acute myocardial infarction, status post stent.  2. Hypertension.  3. Hypokalemia.  4. Prediabetes.   MEDICATIONS ON DISCHARGE: Include amlodipine 10 mg daily, Wellbutrin extended release 150 mg every 24 hours, multivitamin 1 tablet daily, aspirin 81 mg daily, losartan 50 mg daily, potassium chloride 20 mEq 1 tablet daily for 7 days and then stop, Effient 10 mg p.o. daily, Imdur 30 mg p.o. daily, nitroglycerin 0.4 mg 1 tablet sublingual as needed for angina or chest pain every 5 minutes max 3, Coreg 6.25 mg twice a day. The patient will stop taking labetalol and stop taking hydrochlorothiazide/telmisartan.    ACTIVITY: As tolerated.   DIET: Low-sodium, carbohydrate-controlled diet, regular consistency.   FOLLOWUP: With Dr. Mariah Milling, cardiology, in 1 week; 1 to 2 weeks with Dr. Dossie Arbour.   HOSPITAL COURSE: The patient was admitted 12/07/2013 and discharged 12/09/2013. The patient came in with chest pain and indigestion. He was admitted with a non-ST myocardial infarction.   LABORATORY AND RADIOLOGICAL DATA DURING THE HOSPITAL COURSE: Included EKG showed sinus rhythm with marked sinus arrhythmia. Chest x-ray: No active cardiopulmonary disease. LDL 104, HDL 33, triglycerides 224. BNP 197. Glucose 187, BUN 24, creatinine 0.98, sodium 139, potassium 3.1, chloride 102, CO2 30, calcium 9.0. Liver function tests normal range. White blood cell count 6.4, H and H 14.0 and 40.7, platelet count of 220. First troponin negative. Next troponin borderline at 0.07. Next troponin borderline at 0.08. Repeat potassium on the 24th was 3.2. Repeat potassium on the 25th was 2.9. I gave replacement and repeat potassium on the 25th prior to discharge 4.0. Magnesium 2.1. Echocardiogram showed an EF of 50% to 55%, mildly  dilated left atrium, mild tricuspid regurgitation, mild to moderate mitral valve regurgitation. Cardiac cath showed a right dominant coronary system with severe distal RCA disease estimated 99%, severe mid left circumflex disease 90%, severe proximal diagonal disease. The patient had a drug-eluting stent of the 99% stenosis in RPL2 with 0 residual stenosis. The patient will follow up with [facility] for high-risk stenting of the other lesions.   HOSPITAL COURSE PER PROBLEM LIST:  1. For the patient's acute MI, the patient had a stent to the probable culprit lesion. The patient still has to other lesions that need to be followed up with [facility] at [facility] for high-risk stents. That will be arranged as an outpatient in a couple of weeks. Follow up with cardiology, Dr. Mariah Milling, as an outpatient. The patient's medications were changed around. Labetalol was stopped. He was started on Coreg. He is on aspirin, Effient, Imdur and losartan. The patient is unable to tolerate statins, so this is why the statins were not prescribed. The patient was asymptomatic with regards to chest pain or shortness of breath upon discharge and discharged home in stable condition.  2. For his hypertension, blood pressure upon discharge 120/69 with a pulse of 69.  3. Hypokalemia: This was replaced during the hospitalization IV and orally. Potassium upon discharge 4.0. I stopped his hydrochlorothiazide, likely the culprit lesion on why he has persistent hypokalemia. I did give potassium supplementation for another week. I do recommend checking this in followup appointment, but I think the reason for this is the hydrochlorothiazide which was stopped.  4. Prediabetes: Follow up with outpatient medical doctor for  this.    TIME SPENT ON DISCHARGE: 45 minutes.    ____________________________ Herschell Dimes. Renae Gloss, MD rjw:gb D: 12/09/2013 15:41:40 ET T: 12/09/2013 21:25:23 ET JOB#: [id number]  cc: Herschell Dimes.  Renae Gloss, MD, <Dictator> Steele Sizer, MD Antonieta Iba, MD Salley Scarlet MD ELECTRONICALLY SIGNED 12/11/2013 16:30

## 2015-02-11 ENCOUNTER — Other Ambulatory Visit: Payer: Self-pay | Admitting: Cardiovascular Disease

## 2015-03-01 ENCOUNTER — Other Ambulatory Visit: Payer: Self-pay | Admitting: Family Medicine

## 2015-03-04 ENCOUNTER — Other Ambulatory Visit: Payer: Self-pay | Admitting: Family Medicine

## 2015-03-11 ENCOUNTER — Ambulatory Visit: Payer: BLUE CROSS/BLUE SHIELD | Admitting: Cardiovascular Disease

## 2015-03-16 ENCOUNTER — Ambulatory Visit: Payer: BLUE CROSS/BLUE SHIELD | Admitting: Cardiovascular Disease

## 2015-04-21 ENCOUNTER — Ambulatory Visit: Payer: BLUE CROSS/BLUE SHIELD | Admitting: Cardiovascular Disease

## 2015-05-26 ENCOUNTER — Emergency Department: Payer: BLUE CROSS/BLUE SHIELD

## 2015-05-26 ENCOUNTER — Encounter: Payer: Self-pay | Admitting: Emergency Medicine

## 2015-05-26 ENCOUNTER — Observation Stay
Admission: EM | Admit: 2015-05-26 | Discharge: 2015-05-27 | Disposition: A | Discharge status: Home / Self Care | Payer: BLUE CROSS/BLUE SHIELD | Attending: Internal Medicine | Admitting: Internal Medicine

## 2015-05-26 DIAGNOSIS — Z8249 Family history of ischemic heart disease and other diseases of the circulatory system: Secondary | ICD-10-CM | POA: Insufficient documentation

## 2015-05-26 DIAGNOSIS — E785 Hyperlipidemia, unspecified: Secondary | ICD-10-CM | POA: Insufficient documentation

## 2015-05-26 DIAGNOSIS — I25119 Atherosclerotic heart disease of native coronary artery with unspecified angina pectoris: Principal | ICD-10-CM | POA: Insufficient documentation

## 2015-05-26 DIAGNOSIS — Z79899 Other long term (current) drug therapy: Secondary | ICD-10-CM | POA: Insufficient documentation

## 2015-05-26 DIAGNOSIS — K224 Dyskinesia of esophagus: Secondary | ICD-10-CM | POA: Insufficient documentation

## 2015-05-26 DIAGNOSIS — R079 Chest pain, unspecified: Secondary | ICD-10-CM | POA: Diagnosis present

## 2015-05-26 DIAGNOSIS — Z955 Presence of coronary angioplasty implant and graft: Secondary | ICD-10-CM | POA: Diagnosis not present

## 2015-05-26 DIAGNOSIS — Z87442 Personal history of urinary calculi: Secondary | ICD-10-CM | POA: Diagnosis not present

## 2015-05-26 DIAGNOSIS — I10 Essential (primary) hypertension: Secondary | ICD-10-CM | POA: Insufficient documentation

## 2015-05-26 DIAGNOSIS — I25111 Atherosclerotic heart disease of native coronary artery with angina pectoris with documented spasm: Secondary | ICD-10-CM | POA: Insufficient documentation

## 2015-05-26 DIAGNOSIS — I252 Old myocardial infarction: Secondary | ICD-10-CM | POA: Insufficient documentation

## 2015-05-26 DIAGNOSIS — Z7982 Long term (current) use of aspirin: Secondary | ICD-10-CM | POA: Diagnosis not present

## 2015-05-26 DIAGNOSIS — Z888 Allergy status to other drugs, medicaments and biological substances status: Secondary | ICD-10-CM | POA: Insufficient documentation

## 2015-05-26 DIAGNOSIS — I2510 Atherosclerotic heart disease of native coronary artery without angina pectoris: Secondary | ICD-10-CM | POA: Insufficient documentation

## 2015-05-26 DIAGNOSIS — Z87891 Personal history of nicotine dependence: Secondary | ICD-10-CM | POA: Diagnosis not present

## 2015-05-26 DIAGNOSIS — F329 Major depressive disorder, single episode, unspecified: Secondary | ICD-10-CM | POA: Diagnosis not present

## 2015-05-26 DIAGNOSIS — R001 Bradycardia, unspecified: Secondary | ICD-10-CM | POA: Insufficient documentation

## 2015-05-26 DIAGNOSIS — E876 Hypokalemia: Secondary | ICD-10-CM | POA: Insufficient documentation

## 2015-05-26 DIAGNOSIS — R0789 Other chest pain: Secondary | ICD-10-CM | POA: Insufficient documentation

## 2015-05-26 DIAGNOSIS — R072 Precordial pain: Secondary | ICD-10-CM | POA: Diagnosis not present

## 2015-05-26 DIAGNOSIS — Z880 Allergy status to penicillin: Secondary | ICD-10-CM | POA: Diagnosis not present

## 2015-05-26 DIAGNOSIS — E669 Obesity, unspecified: Secondary | ICD-10-CM | POA: Diagnosis not present

## 2015-05-26 DIAGNOSIS — I209 Angina pectoris, unspecified: Secondary | ICD-10-CM | POA: Insufficient documentation

## 2015-05-26 DIAGNOSIS — K219 Gastro-esophageal reflux disease without esophagitis: Secondary | ICD-10-CM | POA: Diagnosis not present

## 2015-05-26 HISTORY — DX: Acute myocardial infarction, unspecified: I21.9

## 2015-05-26 HISTORY — DX: Calculus of kidney: N20.0

## 2015-05-26 LAB — BASIC METABOLIC PANEL
Anion gap: 11 (ref 5–15)
BUN: 33 mg/dL — ABNORMAL HIGH (ref 6–20)
CO2: 27 mmol/L (ref 22–32)
Calcium: 9.6 mg/dL (ref 8.9–10.3)
Chloride: 102 mmol/L (ref 101–111)
Creatinine, Ser: 1.08 mg/dL (ref 0.61–1.24)
GFR calc Af Amer: >60 mL/min (ref >60)
GFR calc non Af Amer: >60 mL/min (ref >60)
Glucose, Bld: 153 mg/dL — ABNORMAL HIGH (ref 65–99)
Potassium: 2.9 mmol/L — CL (ref 3.5–5.1)
Sodium: 140 mmol/L (ref 135–145)

## 2015-05-26 LAB — CBC
HCT: 40.2 % (ref 40.0–52.0)
Hemoglobin: 13.7 g/dL (ref 13.0–18.0)
MCH: 29.4 pg (ref 26.0–34.0)
MCHC: 34.2 g/dL (ref 32.0–36.0)
MCV: 85.9 fL (ref 80.0–100.0)
Platelets: 237 10*3/uL (ref 150–440)
RBC: 4.67 MIL/uL (ref 4.40–5.90)
RDW: 12.8 % (ref 11.5–14.5)
WBC: 6.2 10*3/uL (ref 3.8–10.6)

## 2015-05-26 LAB — TROPONIN I: Troponin I: <0.03 ng/mL (ref <0.031)

## 2015-05-26 MED ORDER — LOSARTAN POTASSIUM 50 MG PO TABS: 100.0000 mg | ORAL_TABLET | Freq: Once | ORAL | Status: DC

## 2015-05-26 MED ORDER — ASPIRIN 81 MG PO CHEW
324.0000 mg | CHEWABLE_TABLET | Freq: Once | ORAL | Status: AC
  Administered 2015-05-26: 324 mg via ORAL
  Filled 2015-05-26: qty 4

## 2015-05-26 NOTE — ED Notes (Signed)
Pt presents to ED with c/o mid sternal chest pain with radiating pain to the left side of his neck. Pt reports he has been feeling fatigued for the past week+. Denies nausea or sob. Reports feeling lightheaded after taking his nitro. Pt alert with no increased work of breathing or distress noted. Skin warm and dry. Previous cardiac hx with MI X2.

## 2015-05-26 NOTE — ED Notes (Signed)
Report received from Susan RN. Patient care assumed. Patient/RN introduction complete. Will continue to monitor.  

## 2015-05-26 NOTE — ED Provider Notes (Signed)
[facility] Emergency Department Provider Note  ____________________________________________  Time seen: 2055  I have reviewed the triage vital signs and the nursing notes.   HISTORY  Chief Complaint Chest Pain and Dizziness     HPI Starr Alzate is a 64 y.o. male with a history of a prior myocardial infarction approximately a year and a half ago, who presents to the emergency department complaining of some chest discomfort that began at 7 PM tonight.  Mr. Wissler has had 2 cardiac catheterizations for stent placement with a total of 3 stents. These were performed by Dr. Mariah Milling and Dr. Kirke Corin.  This evening, before eating anything for dinner, he started to feel a pressure and discomfort in his chest. He had no shortness of breath, nausea, diaphoresis with this event.  The patient does report being under increased stress over the past week or so. His granddaughter has been ill and required advanced medical intervention. He reports having felt fatigued over the past few days.     Past Medical History  Diagnosis Date  . Hypertension   . Hyperlipidemia   . Prediabetes   . Depression   . Obesity   . Coronary artery disease     a. 12/23/13 s/p PCI w/ DES to both D1 and mLCx  . MI (myocardial infarction)   . Kidney stones     Patient Active Problem List   Diagnosis Date Noted  . Coronary artery disease   . Angina effort 12/23/2013  . Coronary atherosclerosis of native coronary artery 12/14/2013  . NSTEMI (non-ST elevated myocardial infarction) 12/14/2013  . Stented coronary artery - D1, mCx 12/14/2013  . Hyperlipemia 12/14/2013  . Essential hypertension 12/14/2013    Past Surgical History  Procedure Laterality Date  . Laminectomy    . Hernia repair    . Tonsillectomy    . Cardiac catheterization  12/08/2013  . Coronary angioplasty  12/08/2013    s/p stent placement  . Percutaneous coronary stent intervention (pci-s) N/A 12/23/2013   Procedure: PERCUTANEOUS CORONARY STENT INTERVENTION (PCI-S);  Surgeon: [name], MD;  Location: [facility] CATH LAB;  Service: Cardiovascular;  Laterality: N/A;    Current Outpatient Rx  Name  Route  Sig  Dispense  Refill  . amLODipine (NORVASC) 10 MG tablet      TAKE 1 TABLET (10 MG TOTAL) BY MOUTH DAILY.   90 tablet   3   . aspirin 81 MG tablet   Oral   Take 81 mg by mouth daily.         Marland Kitchen buPROPion (WELLBUTRIN XL) 150 MG 24 hr tablet   Oral   Take 150 mg by mouth daily.         . carvedilol (COREG) 6.25 MG tablet      TAKE 1 TABLET (6.25 MG TOTAL) BY MOUTH 2 (TWO) TIMES DAILY WITH A MEAL.   180 tablet   3   . Cholecalciferol (VITAMIN D PO)   Oral   Take 2 tablets by mouth daily.          Marland Kitchen EFFIENT 10 MG TABS tablet      TAKE 1 TABLET (10 MG TOTAL) BY MOUTH DAILY.   90 tablet   3   . hydrochlorothiazide (MICROZIDE) 12.5 MG capsule      TAKE ONE CAPSULE BY MOUTH EVERY DAY   90 capsule   1   . losartan (COZAAR) 100 MG tablet      TAKE 1 TABLET (100 MG TOTAL) BY  MOUTH DAILY.   90 tablet   3   . magnesium oxide (MAG-OX) 400 MG tablet   Oral   Take 400 mg by mouth daily.         . Multiple Vitamin (MULTIVITAMIN) capsule   Oral   Take 1 capsule by mouth daily.         . nitroGLYCERIN (NITROSTAT) 0.4 MG SL tablet   Sublingual   Place 1 tablet (0.4 mg total) under the tongue every 5 (five) minutes as needed for chest pain.   25 tablet   6   . rosuvastatin (CRESTOR) 20 MG tablet   Oral   Take 20 mg by mouth daily.         Marland Kitchen ZETIA 10 MG tablet      TAKE 1 TABLET BY MOUTH EVERY DAY   90 tablet   1     Allergies Lipitor; Penicillins; and Zocor  Family History  Problem Relation Age of Onset  . Heart attack Father 29    MI  . Heart disease Father     CABG  . Hypertension Mother   . Hypertension Sister     Social History Social History  Substance Use Topics  . Smoking status: Former Smoker    Types: Cigars    Quit date:  12/08/2013  . Smokeless tobacco: Not on file  . Alcohol Use: 0.6 oz/week    1 Glasses of wine per week    Review of Systems  Constitutional: Negative for fever. Positive for fatigue ENT: Negative for sore throat. Cardiovascular: Chest pressure. See history of present illness. Respiratory: Negative for shortness of breath. Gastrointestinal: Negative for abdominal pain, vomiting and diarrhea. Genitourinary: Negative for dysuria. Musculoskeletal: No myalgias or injuries. Skin: Negative for rash. Neurological: Negative for headaches   10-point ROS otherwise negative.  ____________________________________________   PHYSICAL EXAM:  VITAL SIGNS: ED Triage Vitals  Enc Vitals Group     BP --      Pulse --      Resp --      Temp --      Temp src --      SpO2 --      Weight --      Height --      Head Cir --      Peak Flow --      Pain Score 05/26/15 2019 2     Pain Loc --      Pain Edu? --      Excl. in GC? --     Constitutional:  Alert and oriented. Well appearing and in no distress. ENT   Head: Normocephalic and atraumatic.   Nose: No congestion/rhinnorhea.   Mouth/Throat: Mucous membranes are moist. Cardiovascular: Normal rate at 60, regular rhythm, no murmur noted Respiratory:  Normal respiratory effort, no tachypnea.    Breath sounds are clear and equal bilaterally.  Gastrointestinal: Soft and nontender. No distention.  Back: No muscle spasm, no tenderness, no CVA tenderness. Musculoskeletal: No deformity noted. Nontender with normal range of motion in all extremities.  No noted edema. Neurologic:  Normal speech and language. No gross focal neurologic deficits are appreciated.  Skin:  Skin is warm, dry. No rash noted. Psychiatric: Mood and affect are normal. Speech and behavior are normal.  ____________________________________________    LABS (pertinent positives/negatives)  Labs Reviewed  BASIC METABOLIC PANEL - Abnormal; Notable for the  following:    Potassium 2.9 (*)    Glucose, Bld 153 (*)  BUN 33 (*)    All other components within normal limits  CBC  TROPONIN I     ____________________________________________   EKG  ED ECG REPORT I, Tarron Krolak W, the attending physician, personally viewed and interpreted this ECG.   Date: 05/26/2015  EKG Time: 2014  Rate: 56  Rhythm: Sinus bradycardia  Axis: Normal  Intervals: Normal  ST&T Change: None noted   ____________________________________________    RADIOLOGY  Chest x-ray: FINDINGS: Heart size is upper limits normal. Lungs are free of focal consolidations and pleural effusions. No pulmonary edema.  IMPRESSION: No active cardiopulmonary disease.  ____________________________________________   PROCEDURES    ____________________________________________   INITIAL IMPRESSION / ASSESSMENT AND PLAN / ED COURSE  Pertinent labs & imaging results that were available during my care of the patient were reviewed by me and considered in my medical decision making (see chart for details).  64 year old male with a positive cardiac history with some mild chest discomfort without radiation, nausea, or shortness of breath. Given his history, he is appropriate and concerned. The symptoms started at 7 PM. If his initial troponin is positive, we will certainly admit him to the hospital. If it is negative, we will discuss this with his cardiologist to help decide if he may be discharged home in a few more hours after a second troponin or if he should be admitted due to his history after the first troponin.  ----------------------------------------- 10:54 PM on 05/26/2015 -----------------------------------------  The initial troponin level was negative. I called and spoke with Dr. Elease Hashimoto, cardiology, who has recommended that we keep the patient in the hospital for serial enzymes and observation overnight. I discussed this with the patient. He agrees.  At this  time, his blood pressure has been creeping up a little bit. He has not had his evening dose of losartan. We will provide that for him now.  ____________________________________________   FINAL CLINICAL IMPRESSION(S) / ED DIAGNOSES  Final diagnoses:  Chest discomfort  Coronary artery disease involving native coronary artery of native heart with angina pectoris      Darien Ramus, MD 05/26/15 2257

## 2015-05-26 NOTE — ED Notes (Signed)
Pt awaiting admission, no cp at this time, monitor in place, will continue to monitor.

## 2015-05-27 ENCOUNTER — Encounter: Payer: Self-pay | Admitting: *Deleted

## 2015-05-27 DIAGNOSIS — K224 Dyskinesia of esophagus: Secondary | ICD-10-CM | POA: Diagnosis not present

## 2015-05-27 DIAGNOSIS — R0789 Other chest pain: Secondary | ICD-10-CM | POA: Diagnosis not present

## 2015-05-27 DIAGNOSIS — I25111 Atherosclerotic heart disease of native coronary artery with angina pectoris with documented spasm: Secondary | ICD-10-CM

## 2015-05-27 DIAGNOSIS — E785 Hyperlipidemia, unspecified: Secondary | ICD-10-CM | POA: Insufficient documentation

## 2015-05-27 DIAGNOSIS — I209 Angina pectoris, unspecified: Secondary | ICD-10-CM | POA: Insufficient documentation

## 2015-05-27 DIAGNOSIS — I2510 Atherosclerotic heart disease of native coronary artery without angina pectoris: Secondary | ICD-10-CM | POA: Insufficient documentation

## 2015-05-27 DIAGNOSIS — E876 Hypokalemia: Secondary | ICD-10-CM | POA: Diagnosis not present

## 2015-05-27 LAB — BASIC METABOLIC PANEL
Anion gap: 9 (ref 5–15)
BUN: 19 mg/dL (ref 6–20)
CO2: 27 mmol/L (ref 22–32)
Calcium: 8.6 mg/dL — ABNORMAL LOW (ref 8.9–10.3)
Chloride: 105 mmol/L (ref 101–111)
Creatinine, Ser: 0.82 mg/dL (ref 0.61–1.24)
GFR calc Af Amer: >60 mL/min (ref >60)
GFR calc non Af Amer: >60 mL/min (ref >60)
Glucose, Bld: 206 mg/dL — ABNORMAL HIGH (ref 65–99)
Potassium: 3.3 mmol/L — ABNORMAL LOW (ref 3.5–5.1)
Sodium: 141 mmol/L (ref 135–145)

## 2015-05-27 LAB — TSH: TSH: 3.274 u[IU]/mL (ref 0.350–4.500)

## 2015-05-27 LAB — TROPONIN I
Troponin I: <0.03 ng/mL (ref <0.031)
Troponin I: <0.03 ng/mL (ref <0.031)

## 2015-05-27 LAB — HEMOGLOBIN A1C: Hgb A1c MFr Bld: 9.5 % — ABNORMAL HIGH (ref 4.0–6.0)

## 2015-05-27 MED ORDER — HEPARIN SODIUM (PORCINE) 5000 UNIT/ML IJ SOLN
5000.0000 [IU] | Freq: Three times a day (TID) | INTRAMUSCULAR | Status: DC
  Filled 2015-05-27 (×2): qty 1

## 2015-05-27 MED ORDER — SODIUM CHLORIDE 0.9 % IJ SOLN
3.0000 mL | Freq: Two times a day (BID) | INTRAMUSCULAR | Status: DC
  Administered 2015-05-27: 3 mL via INTRAVENOUS

## 2015-05-27 MED ORDER — AMLODIPINE BESYLATE 10 MG PO TABS
10.0000 mg | ORAL_TABLET | Freq: Every day | ORAL | Status: DC
  Administered 2015-05-27: 10 mg via ORAL
  Filled 2015-05-27: qty 1

## 2015-05-27 MED ORDER — HYDROCHLOROTHIAZIDE 12.5 MG PO CAPS
12.5000 mg | ORAL_CAPSULE | Freq: Every day | ORAL | Status: DC
  Administered 2015-05-27: 12.5 mg via ORAL
  Filled 2015-05-27: qty 1

## 2015-05-27 MED ORDER — NITROGLYCERIN 0.4 MG SL SUBL: 0.4000 mg | SUBLINGUAL_TABLET | SUBLINGUAL | Status: DC | PRN

## 2015-05-27 MED ORDER — MULTIVITAMINS PO CAPS: 1.0000 | ORAL_CAPSULE | Freq: Every day | ORAL | Status: DC

## 2015-05-27 MED ORDER — POTASSIUM CHLORIDE CRYS ER 20 MEQ PO TBCR
EXTENDED_RELEASE_TABLET | ORAL | Status: AC
  Administered 2015-05-27: 40 meq
  Filled 2015-05-27: qty 2

## 2015-05-27 MED ORDER — ROSUVASTATIN CALCIUM 20 MG PO TABS
20.0000 mg | ORAL_TABLET | Freq: Every day | ORAL | Status: DC
  Filled 2015-05-27: qty 1

## 2015-05-27 MED ORDER — PRASUGREL HCL 10 MG PO TABS
10.0000 mg | ORAL_TABLET | Freq: Every day | ORAL | Status: DC
  Administered 2015-05-27: 10 mg via ORAL
  Filled 2015-05-27: qty 1

## 2015-05-27 MED ORDER — MAGNESIUM OXIDE 400 (241.3 MG) MG PO TABS
400.0000 mg | ORAL_TABLET | Freq: Every day | ORAL | Status: DC
  Administered 2015-05-27: 400 mg via ORAL
  Filled 2015-05-27: qty 1

## 2015-05-27 MED ORDER — ACETAMINOPHEN 650 MG RE SUPP
650.0000 mg | Freq: Four times a day (QID) | RECTAL | Status: DC | PRN
Start: 2015-05-27 — End: 2015-05-27

## 2015-05-27 MED ORDER — POTASSIUM CHLORIDE CRYS ER 20 MEQ PO TBCR
40.0000 meq | EXTENDED_RELEASE_TABLET | Freq: Once | ORAL | Status: AC
  Administered 2015-05-27: 40 meq via ORAL
  Filled 2015-05-27: qty 2

## 2015-05-27 MED ORDER — CARVEDILOL 6.25 MG PO TABS
6.2500 mg | ORAL_TABLET | Freq: Two times a day (BID) | ORAL | Status: DC
  Administered 2015-05-27: 6.25 mg via ORAL
  Filled 2015-05-27: qty 1

## 2015-05-27 MED ORDER — LOSARTAN POTASSIUM 50 MG PO TABS
100.0000 mg | ORAL_TABLET | Freq: Every day | ORAL | Status: DC
  Filled 2015-05-27: qty 2

## 2015-05-27 MED ORDER — ASPIRIN 81 MG PO CHEW
81.0000 mg | CHEWABLE_TABLET | Freq: Every day | ORAL | Status: DC
  Filled 2015-05-27: qty 1

## 2015-05-27 MED ORDER — POTASSIUM CHLORIDE IN NACL 40-0.9 MEQ/L-% IV SOLN
INTRAVENOUS | Status: DC
  Administered 2015-05-27 (×2): 125 mL/h via INTRAVENOUS
  Filled 2015-05-27 (×5): qty 1000

## 2015-05-27 MED ORDER — MORPHINE SULFATE (PF) 2 MG/ML IV SOLN: 2.0000 mg | INTRAVENOUS | Status: DC | PRN

## 2015-05-27 MED ORDER — ONDANSETRON HCL 4 MG/2ML IJ SOLN: 4.0000 mg | Freq: Four times a day (QID) | INTRAMUSCULAR | Status: DC | PRN

## 2015-05-27 MED ORDER — CETYLPYRIDINIUM CHLORIDE 0.05 % MT LIQD: 7.0000 mL | Freq: Two times a day (BID) | OROMUCOSAL | Status: DC

## 2015-05-27 MED ORDER — BUPROPION HCL ER (XL) 150 MG PO TB24
150.0000 mg | ORAL_TABLET | Freq: Every day | ORAL | Status: DC
  Administered 2015-05-27: 150 mg via ORAL
  Filled 2015-05-27: qty 1

## 2015-05-27 MED ORDER — ONDANSETRON HCL 4 MG PO TABS
4.0000 mg | ORAL_TABLET | Freq: Four times a day (QID) | ORAL | Status: DC | PRN
Start: 2015-05-27 — End: 2015-05-27

## 2015-05-27 MED ORDER — VITAMIN D 1000 UNITS PO TABS
1000.0000 [IU] | ORAL_TABLET | Freq: Every day | ORAL | Status: DC
  Administered 2015-05-27: 1000 [IU] via ORAL
  Filled 2015-05-27: qty 1

## 2015-05-27 MED ORDER — EZETIMIBE 10 MG PO TABS
10.0000 mg | ORAL_TABLET | Freq: Every day | ORAL | Status: DC
  Filled 2015-05-27: qty 1

## 2015-05-27 MED ORDER — CHLORHEXIDINE GLUCONATE 0.12 % MT SOLN: 15.0000 mL | Freq: Two times a day (BID) | OROMUCOSAL | Status: DC

## 2015-05-27 MED ORDER — ADULT MULTIVITAMIN W/MINERALS CH
1.0000 | ORAL_TABLET | Freq: Every day | ORAL | Status: DC
  Administered 2015-05-27: 1 via ORAL
  Filled 2015-05-27: qty 1

## 2015-05-27 MED ORDER — ACETAMINOPHEN 325 MG PO TABS: 650.0000 mg | ORAL_TABLET | Freq: Four times a day (QID) | ORAL | Status: DC | PRN

## 2015-05-27 NOTE — Plan of Care (Signed)
Problem: Discharge Progression Outcomes Goal: Other Discharge Outcomes/Goals Outcome: Completed/Met Date Met:  05/27/15 Pt is alert and oriented x 4, denies pain, no chest pain, on room air, vital signs stable, up to bathroom with stand by assist, SB on tele, pt is d/c to home, no new medications, cardiologist advised patient to stop taking hydrchlorothiazide, appointment for pt to f/u with cardiologist and pcp already schedulded. Pt is d/c to home via wife, pushed to visitor entrance via nursing staff. Potassium serum level improved.

## 2015-05-27 NOTE — Consult Note (Signed)
Cardiology Consultation Note  Patient ID: Clinton Dorsey, MRN: [id number], DOB/AGE: January 07, 1951 64 y.o. Admit date: 05/26/2015   Date of Consult: 05/27/2015 Primary Physician: Vonita Moss, MD Primary Cardiologist: Dr. Mariah Milling, MD  Chief Complaint: Chest pain Reason for Consult: Chest pain  HPI: 64 y.o. male with h/o CAD/NSTEMI in 11/2013 s/p PCI RPL2 12/08/2013 and to mid LCx and D1 12/23/2013, prediabetes, HTN, HLD, obesity, and depression who presented to [facility] on 05/26/2015 with complaints of reflux and chest pain after getting choked on a piece of cheese stick the night prior.     He was admitted to the hospital in 11/2013 with chest pain. Ruled in for NSTEMI with CK-MB of 17.7 and CK of 656. Cardiac cath showed severe 3 vessel disease. He has stent placed at distal RCA/ostial PL branch in setting of 99% stenosis. Cardiac cath also showed mid LCx with 99% stenosis and 80% prox D1 disease. Attempt to place a stent to the mid circumflex was unsuccessful and given that he had significant dye and radiation exposure, it was recommended that he have PCI of his left circumflex and diagonal vessel at a later date. He underwent successful placement of DES to the mid LCx and D1 lesions at [facility] on 12/23/2013. At his last follow up on 12/20/2014 he reported he had stopped his Crestor. He was concerned about memory loss. Upon stopping this he felt like his memory had improved. He has had some issues with elevated BP. PCP has placed him on HCTZ in addition to his regular antihypertensives.   He exercises regularly without any anginal symptoms.   He was eating a piece of cheese stick on 9/8 and this got stuck in his esophagus. Had a difficult time clearing this. This led to some pain and heart burn-like symptomology. No associated SOB, diaphoresis, palpitations, presyncope, or syncope. Symptoms were not like his prior MI. Prior MI was neck pain. Because of this pain he presented to [facility] just to be safe.   Upon his arrival  to [facility] he was found to have negative troponin x 3, CXR with no active cardiopulmonary disease, ECG sinus bradycardia, 56 bpm, nonspecific inferior st/t changes. He was found to be markedly hypokalemic at 2.9. He was placed in IV repletion. Upon getting IV potassium he noted improved generalized myalgias. BP was mildly elevated this morning, but has since improved. He is symptom free this morning.      Past Medical History  Diagnosis Date  . Hypertension   . Hyperlipidemia   . Prediabetes   . Depression   . Obesity   . Coronary artery disease     a. 12/23/13 s/p PCI w/ DES to both D1 and mLCx  . MI (myocardial infarction)   . Kidney stones       Most Recent Cardiac Studies: Echo 12/08/2013:  EF 55-60%, mild LVH, normal RV size and systolic function, mildly dilated left atrium, mild TR, mild to moderate MR, RVSP mildly elevated.   Cardiac cath 12/08/2013:  Severe three-vessel CAD. D1 80% along proximal third. Mid LCx 90%. RLP2 99% s/p PCI/DES. EF 55%. Attempt to place a stent to the mid circumflex was unsuccessful and given that he had significant dye and radiation exposure, it was recommended that he have PCI of his left circumflex and diagonal vessel at a later date.   Cardiac cath 12/23/2013:  PCI Data: Vessel - first diagonal/Segment - proximal Percent Stenosis (pre) 90% TIMI-flow 3 Stent 2.5 x 18 mm Xience drug-eluting stent postdilated with  a 2.75 noncompliant balloon Percent Stenosis (post) 0% TIMI-flow (post) 3  Vessel - left circumflex/Segment - mid  Percent Stenosis (pre) 95% TIMI-flow 3 Stent 2.5 x 28 mm Xience drug-eluting stent postdilated with a 2.75 noncompliant balloon Percent Stenosis (post) 0% TIMI-flow (post) 3  Final Conclusions:  Successful angioplasty and drug-eluting stent placement to both first diagonal and mid left circumflex.  Recommendations:  Continue dual antiplatelet therapy for at least one year. There is a residual 40% distal left main  stenosis and 50% ostial left circumflex stenosis. Aggressive medical therapy is recommended.     Surgical History:  Past Surgical History  Procedure Laterality Date  . Laminectomy    . Hernia repair    . Tonsillectomy    . Cardiac catheterization  12/08/2013  . Coronary angioplasty  12/08/2013    s/p stent placement  . Percutaneous coronary stent intervention (pci-s) N/A 12/23/2013    Procedure: PERCUTANEOUS CORONARY STENT INTERVENTION (PCI-S);  Surgeon: [name], MD;  Location: [facility] CATH LAB;  Service: Cardiovascular;  Laterality: N/A;     Home Meds: Prior to Admission medications   Medication Sig Start Date End Date Taking? Authorizing Provider  amLODipine (NORVASC) 10 MG tablet TAKE 1 TABLET (10 MG TOTAL) BY MOUTH DAILY. 11/15/14  Yes Antonieta Iba, MD  aspirin 81 MG tablet Take 81 mg by mouth daily.   Yes Historical Provider, MD  buPROPion (WELLBUTRIN XL) 150 MG 24 hr tablet Take 150 mg by mouth daily.   Yes Historical Provider, MD  carvedilol (COREG) 6.25 MG tablet TAKE 1 TABLET (6.25 MG TOTAL) BY MOUTH 2 (TWO) TIMES DAILY WITH A MEAL. 11/15/14  Yes Antonieta Iba, MD  Cholecalciferol (VITAMIN D PO) Take 2 tablets by mouth daily.    Yes Historical Provider, MD  EFFIENT 10 MG TABS tablet TAKE 1 TABLET (10 MG TOTAL) BY MOUTH DAILY. 02/11/15  Yes Antonieta Iba, MD  hydrochlorothiazide (MICROZIDE) 12.5 MG capsule TAKE ONE CAPSULE BY MOUTH EVERY DAY 03/07/15  Yes Steele Sizer, MD  losartan (COZAAR) 100 MG tablet TAKE 1 TABLET (100 MG TOTAL) BY MOUTH DAILY. 11/15/14  Yes Antonieta Iba, MD  magnesium oxide (MAG-OX) 400 MG tablet Take 400 mg by mouth daily.   Yes Historical Provider, MD  Multiple Vitamin (MULTIVITAMIN) capsule Take 1 capsule by mouth daily.   Yes Historical Provider, MD  nitroGLYCERIN (NITROSTAT) 0.4 MG SL tablet Place 1 tablet (0.4 mg total) under the tongue every 5 (five) minutes as needed for chest pain. 01/04/14  Yes Antonieta Iba, MD  rosuvastatin  (CRESTOR) 20 MG tablet Take 20 mg by mouth daily.   Yes Historical Provider, MD  ZETIA 10 MG tablet TAKE 1 TABLET BY MOUTH EVERY DAY 03/01/15  Yes Steele Sizer, MD    Inpatient Medications:  . amLODipine  10 mg Oral Daily  . antiseptic oral rinse  7 mL Mouth Rinse q12n4p  . aspirin  81 mg Oral Daily  . buPROPion  150 mg Oral Daily  . carvedilol  6.25 mg Oral BID WC  . chlorhexidine  15 mL Mouth Rinse BID  . cholecalciferol  1,000 Units Oral Daily  . ezetimibe  10 mg Oral Daily  . heparin  5,000 Units Subcutaneous 3 times per day  . hydrochlorothiazide  12.5 mg Oral Daily  . losartan  100 mg Oral Once  . losartan  100 mg Oral Daily  . magnesium oxide  400 mg Oral Daily  . multivitamin with minerals  1 tablet Oral Daily  . prasugrel  10 mg Oral Daily  . rosuvastatin  20 mg Oral Daily  . sodium chloride  3 mL Intravenous Q12H   . 0.9 % NaCl with KCl 40 mEq / L 125 mL/hr (05/27/15 0205)    Allergies:  Allergies  Allergen Reactions  . Lipitor [Atorvastatin]     Myalgias   . Penicillins     Rash   . Zocor [Simvastatin]     Myalgias     Social History   Social History  . Marital Status: Married    Spouse Name: N/A  . Number of Children: N/A  . Years of Education: N/A   Occupational History  . Not on file.   Social History Main Topics  . Smoking status: Former Smoker    Types: Cigars    Quit date: 12/08/2013  . Smokeless tobacco: Not on file  . Alcohol Use: 0.6 oz/week    1 Glasses of wine per week  . Drug Use: No  . Sexual Activity: Not on file   Other Topics Concern  . Not on file   Social History Narrative     Family History  Problem Relation Age of Onset  . Heart attack Father 39    MI  . Heart disease Father     CABG  . Hypertension Mother   . Hypertension Sister      Review of Systems: Review of Systems  Constitutional: Negative for fever, chills, weight loss, malaise/fatigue and diaphoresis.  HENT: Negative for congestion.   Eyes:  Negative for discharge and redness.  Respiratory: Negative for cough, hemoptysis, sputum production, shortness of breath and wheezing.   Cardiovascular: Negative for chest pain, palpitations, orthopnea, claudication, leg swelling and PND.  Gastrointestinal: Positive for heartburn. Negative for nausea, vomiting and abdominal pain.  Musculoskeletal: Negative for falls.  Skin: Negative for rash.  Neurological: Negative for sensory change, speech change, focal weakness and weakness.  Endo/Heme/Allergies: Does not bruise/bleed easily.  Psychiatric/Behavioral: The patient is nervous/anxious.   All other systems reviewed and are negative.    Labs:  Recent Labs  05/26/15 2037 05/27/15 0304 05/27/15 0723  TROPONINI <0.03 <0.03 <0.03   Lab Results  Component Value Date   WBC 6.2 05/26/2015   HGB 13.7 05/26/2015   HCT 40.2 05/26/2015   MCV 85.9 05/26/2015   PLT 237 05/26/2015    Recent Labs Lab 05/26/15 2037  NA 140  K 2.9*  CL 102  CO2 27  BUN 33*  CREATININE 1.08  CALCIUM 9.6  GLUCOSE 153*   Lab Results  Component Value Date   CHOL 128 04/05/2014   HDL 36* 04/05/2014   LDLCALC 61 04/05/2014   TRIG 156* 04/05/2014   No results found for: DDIMER  Radiology/Studies:  Dg Chest 2 View  05/26/2015   CLINICAL DATA:  Complain of midsternal chest pain, radiating to left side in the neck. Fatigue for the last week.  EXAM: CHEST  2 VIEW  COMPARISON:  12/07/2013  FINDINGS: Heart size is upper limits normal. Lungs are free of focal consolidations and pleural effusions. No pulmonary edema.  IMPRESSION: No active cardiopulmonary disease.   Electronically Signed   By: Norva Pavlov M.D.   On: 05/26/2015 21:00    EKG: sinus bradycardia, 56 bpm, nonspecific inferior st/t changes  Weights: Filed Weights   05/27/15 0120 05/27/15 0432  Weight: 185 lb 4.8 oz (84.052 kg) 184 lb 13.7 oz (83.85 kg)     Physical Exam: Blood  pressure 127/78, pulse 52, temperature 98.5 F (36.9 C),  temperature source Oral, resp. rate 18, weight 184 lb 13.7 oz (83.85 kg), SpO2 94 %. Body mass index is 29.85 kg/(m^2). General: Well developed, well nourished, in no acute distress. Head: Normocephalic, atraumatic, sclera non-icteric, no xanthomas, nares are without discharge.  Neck: Negative for carotid bruits. JVD not elevated. Lungs: Clear bilaterally to auscultation without wheezes, rales, or rhonchi. Breathing is unlabored. Heart: RRR with S1 S2. No murmurs, rubs, or gallops appreciated. Abdomen: Soft, non-tender, non-distended with normoactive bowel sounds. No hepatomegaly. No rebound/guarding. No obvious abdominal masses. Msk:  Strength and tone appear normal for age. Extremities: No clubbing or cyanosis. No edema.  Distal pedal pulses are 2+ and equal bilaterally. Neuro: Alert and oriented X 3. No facial asymmetry. No focal deficit. Moves all extremities spontaneously. Psych:  Responds to questions appropriately with a normal affect.    Assessment and Plan:  64 y.o. male with h/o CAD/NSTEMI in 11/2013 s/p PCI RPL2 12/08/2013 and to mid LCx and D1 12/23/2013, prediabetes, HTN, HLD, obesity, and depression who presented to [facility] on 05/26/2015 with complaints of reflux and chest pain after getting choked on a piece of cheese stick the night prior and was found to be markedly hypokalemic with a potassium of 2.9 upon his arrival.   1. Atypical chest pain: -Symptoms occurred after a piece of cheese stick got stuck in his esophagus and in the setting of marked hypokalemia of 2.9 -No exertional symptoms, continues to exercise regularly without issues -Since having his potassium repleted he feels much better  -ECG non-acute, troponin negative x 3 -No ischemic work up planned at this time -Patient asks about echo, not indicated given his about symptomology, could pursue as outpatient if new symptoms develop or persist  -Could also pursue GI evaluation for possible esophageal dilatation  -Would hold  HCTZ given his above hypokalemia, should his BP start to climb could address with alternative medication   2. CAD s/p PCI as above: -Continue Effient 10 mg daily and aspirin 81 mg daily -Continue Coreg 6.25 mg bid, Crestor 10 mg, losartan 100 mg  -No ischemic eval as above  3. Hypokalemia: -IV repletion -Would recheck potassium level prior to discharge, goal of 4.0 prior to discharge -May need low dose KCl for home -Discontinue HCTZ as above  4. HTN: -Improved since admission -Likely inflammatory -Monitor off HCTZ -Continue remaining medications  5. HLD: -Crestor as above  Elinor Dodge, PA-C Pager: [phone] 05/27/2015, 8:56 AM

## 2015-05-27 NOTE — H&P (Signed)
Clinton Dorsey is an 64 y.o. male.   Chief Complaint: Chest pain HPI: The patient with past medical history of coronary artery disease presents emergency department complaining of vague chest discomfort that lasted approximately 20-30 minutes. It began after eating a small piece of cheese that made the patient feels that he had indigestion. He took 2 Tums without relief. This prompted him to take 1 nitroglycerin tablet which briefly relieved his pain but it returned and was dull and substernal character and location. He denies associated nausea vomiting or diaphoresis, however he admits that his previous myocardial infarction was without symptoms as well. The pain did not radiate. He admits to feeling fatigued lately but regularly exercises without chest pain. In the emergency department the patient was monitored negative enzymes and no EKG evidence of ischemia but due to his history of coronary artery disease the emergency department staff called for admission to rule out myocardial ischemia.  Past Medical History  Diagnosis Date  . Hypertension   . Hyperlipidemia   . Prediabetes   . Depression   . Obesity   . Coronary artery disease     a. 12/23/13 s/p PCI w/ DES to both D1 and mLCx  . MI (myocardial infarction)   . Kidney stones     Past Surgical History  Procedure Laterality Date  . Laminectomy    . Hernia repair    . Tonsillectomy    . Cardiac catheterization  12/08/2013  . Coronary angioplasty  12/08/2013    s/p stent placement  . Percutaneous coronary stent intervention (pci-s) N/A 12/23/2013    Procedure: PERCUTANEOUS CORONARY STENT INTERVENTION (PCI-S);  Surgeon: Wellington Hampshire, MD;  Location: [facility] CATH LAB;  Service: Cardiovascular;  Laterality: N/A;    Family History  Problem Relation Age of Onset  . Heart attack Father 25    MI  . Heart disease Father     CABG  . Hypertension Mother   . Hypertension Sister    Social History:  reports that he quit smoking about 17 months  ago. His smoking use included Cigars. He does not have any smokeless tobacco history on file. He reports that he drinks about 0.6 oz of alcohol per week. He reports that he does not use illicit drugs.  Allergies:  Allergies  Allergen Reactions  . Lipitor [Atorvastatin]     Myalgias   . Penicillins     Rash   . Zocor [Simvastatin]     Myalgias     Medications Prior to Admission  Medication Sig Dispense Refill  . amLODipine (NORVASC) 10 MG tablet TAKE 1 TABLET (10 MG TOTAL) BY MOUTH DAILY. 90 tablet 3  . aspirin 81 MG tablet Take 81 mg by mouth daily.    Marland Kitchen buPROPion (WELLBUTRIN XL) 150 MG 24 hr tablet Take 150 mg by mouth daily.    . carvedilol (COREG) 6.25 MG tablet TAKE 1 TABLET (6.25 MG TOTAL) BY MOUTH 2 (TWO) TIMES DAILY WITH A MEAL. 180 tablet 3  . Cholecalciferol (VITAMIN D PO) Take 2 tablets by mouth daily.     Marland Kitchen EFFIENT 10 MG TABS tablet TAKE 1 TABLET (10 MG TOTAL) BY MOUTH DAILY. 90 tablet 3  . hydrochlorothiazide (MICROZIDE) 12.5 MG capsule TAKE ONE CAPSULE BY MOUTH EVERY DAY 90 capsule 1  . losartan (COZAAR) 100 MG tablet TAKE 1 TABLET (100 MG TOTAL) BY MOUTH DAILY. 90 tablet 3  . magnesium oxide (MAG-OX) 400 MG tablet Take 400 mg by mouth daily.    Marland Kitchen  Multiple Vitamin (MULTIVITAMIN) capsule Take 1 capsule by mouth daily.    . nitroGLYCERIN (NITROSTAT) 0.4 MG SL tablet Place 1 tablet (0.4 mg total) under the tongue every 5 (five) minutes as needed for chest pain. 25 tablet 6  . rosuvastatin (CRESTOR) 20 MG tablet Take 20 mg by mouth daily.    Marland Kitchen ZETIA 10 MG tablet TAKE 1 TABLET BY MOUTH EVERY DAY 90 tablet 1    Results for orders placed or performed during the hospital encounter of 05/26/15 (from the past 48 hour(s))  Basic metabolic panel     Status: Abnormal   Collection Time: 05/26/15  8:37 PM  Result Value Ref Range   Sodium 140 135 - 145 mmol/L   Potassium 2.9 (LL) 3.5 - 5.1 mmol/L    Comment: CRITICAL RESULT CALLED TO, READ BACK BY AND VERIFIED WITH SUSAN NEAL ON  05/26/15 AT 934PM BY TB.    Chloride 102 101 - 111 mmol/L   CO2 27 22 - 32 mmol/L   Glucose, Bld 153 (H) 65 - 99 mg/dL   BUN 33 (H) 6 - 20 mg/dL   Creatinine, Ser 1.08 0.61 - 1.24 mg/dL   Calcium 9.6 8.9 - 10.3 mg/dL   GFR calc non Af Amer >60 >60 mL/min   GFR calc Af Amer >60 >60 mL/min    Comment: (NOTE) The eGFR has been calculated using the CKD EPI equation. This calculation has not been validated in all clinical situations. eGFR's persistently <60 mL/min signify possible Chronic Kidney Disease.    Anion gap 11 5 - 15  CBC     Status: None   Collection Time: 05/26/15  8:37 PM  Result Value Ref Range   WBC 6.2 3.8 - 10.6 K/uL   RBC 4.67 4.40 - 5.90 MIL/uL   Hemoglobin 13.7 13.0 - 18.0 g/dL   HCT 40.2 40.0 - 52.0 %   MCV 85.9 80.0 - 100.0 fL   MCH 29.4 26.0 - 34.0 pg   MCHC 34.2 32.0 - 36.0 g/dL   RDW 12.8 11.5 - 14.5 %   Platelets 237 150 - 440 K/uL  Troponin I     Status: None   Collection Time: 05/26/15  8:37 PM  Result Value Ref Range   Troponin I <0.03 <0.031 ng/mL    Comment:        NO INDICATION OF MYOCARDIAL INJURY.   TSH     Status: None   Collection Time: 05/26/15  8:37 PM  Result Value Ref Range   TSH 3.274 0.350 - 4.500 uIU/mL   Dg Chest 2 View  05/26/2015   CLINICAL DATA:  Complain of midsternal chest pain, radiating to left side in the neck. Fatigue for the last week.  EXAM: CHEST  2 VIEW  COMPARISON:  12/07/2013  FINDINGS: Heart size is upper limits normal. Lungs are free of focal consolidations and pleural effusions. No pulmonary edema.  IMPRESSION: No active cardiopulmonary disease.   Electronically Signed   By: Nolon Nations M.D.   On: 05/26/2015 21:00    Review of Systems  Constitutional: Negative for fever and chills.  HENT: Negative for sore throat and tinnitus.   Eyes: Negative for blurred vision and redness.  Respiratory: Negative for cough and shortness of breath.   Cardiovascular: Positive for chest pain. Negative for palpitations,  orthopnea and PND.  Gastrointestinal: Positive for heartburn. Negative for nausea, vomiting, abdominal pain and diarrhea.  Genitourinary: Negative for dysuria, urgency and frequency.  Musculoskeletal: Negative for myalgias and  joint pain.  Skin: Negative for rash.       No lesions  Neurological: Negative for speech change, focal weakness and weakness.  Endo/Heme/Allergies: Does not bruise/bleed easily.       No temperature intolerance  Psychiatric/Behavioral: Negative for depression and suicidal ideas.    Blood pressure 152/89, pulse 58, temperature 98 F (36.7 C), temperature source Oral, resp. rate 16, SpO2 97 %. Physical Exam  Nursing note and vitals reviewed. Constitutional: He is oriented to person, place, and time. He appears well-developed and well-nourished. No distress.  HENT:  Head: Normocephalic and atraumatic.  Mouth/Throat: No oropharyngeal exudate.  Eyes: Conjunctivae and EOM are normal. Pupils are equal, round, and reactive to light. No scleral icterus.  Neck: Normal range of motion. Neck supple. No JVD present. No tracheal deviation present. No thyromegaly present.  Cardiovascular: Normal rate, regular rhythm and normal heart sounds.  Exam reveals no gallop and no friction rub.   No murmur heard. Respiratory: Effort normal and breath sounds normal. No respiratory distress. He has no wheezes.  GI: Soft. Bowel sounds are normal. He exhibits no distension. There is no tenderness.  Genitourinary:  Deferred  Musculoskeletal: Normal range of motion. He exhibits no edema.  Lymphadenopathy:    He has no cervical adenopathy.  Neurological: He is alert and oriented to person, place, and time. No cranial nerve deficit.  Skin: Skin is warm and dry. No rash noted. No erythema.  Psychiatric: He has a normal mood and affect. His behavior is normal. Judgment and thought content normal.     Assessment/Plan This is a 64 year old Caucasian male admitted for chest pain. 1. Chest  pain: Consistent with the patient's history of coronary artery disease and myocardial infarction. Cardiac biomarkers are reassuring at this time but we will continue to follow troponin and monitor telemetry. Cardiology consultation ordered due to atypical symptoms 2. Coronary artery disease: Continue aspirin and Effient 3. Hypertension: Continue amlodipine, losartan and Coreg 4. Hyperlipidemia: Continue Crestor and Zetia 5. Depression: Continue Wellbutrin 6. DVT prophylaxis: Heparin 7. GI prophylaxis: None The patient is a full code. Time spent on admission orders and patient care approximately 35 minutes  Harrie Foreman 05/27/2015, 1:20 AM

## 2015-05-27 NOTE — ED Notes (Signed)
Dr Diamond in to admit  

## 2015-05-27 NOTE — ED Notes (Signed)
Report called to receiving Museum/gallery conservator . Patient and family informed of room assignment, and plan for transfer. Patient in stable condition. Preparing for transfer

## 2015-05-27 NOTE — Care Management Note (Signed)
Case Management Note  Patient Details  Name: Clinton Dorsey MRN: [id number] Date of Birth: 01/04/1951  Subjective/Objective:      Pt. Here with c/o chest pain, and cardiac history. Believes this was due to stress recently when his daughter got airlifted out of the area for urgent illness. He is independent with his adl's. He is married and he and his wife share  Cooking duties.    He understands all 3 troponins were negative, and is just waiting to see what test he is due for. Ready to go home.         He feels better and would just like to go home at this point.  He has no needs at this time, but we will re-visit if needed.    Action/Plan:   Expected Discharge Date:                  Expected Discharge Plan:     In-House Referral:     Discharge planning Services     Post Acute Care Choice:    Choice offered to:     DME Arranged:    DME Agency:     HH Arranged:    HH Agency:     Status of Service:     Medicare Important Message Given:    Date Medicare IM Given:    Medicare IM give by:    Date Additional Medicare IM Given:    Additional Medicare Important Message give by:     If discussed at Long Length of Stay Meetings, dates discussed:    Additional Comments:  Berna Bue, RN 05/27/2015, 9:13 AM

## 2015-05-27 NOTE — Progress Notes (Signed)
Inpatient Diabetes Program Recommendations  AACE/ADA: New Consensus Statement on Inpatient Glycemic Control (2013)  Target Ranges:  Prepandial:   less than 140 mg/dL      Peak postprandial:   less than 180 mg/dL (1-2 hours)      Critically ill patients:  140 - 180 mg/dL   Results for BRIYON, MENSAH (MRN [id number]) as of 05/27/2015 10:57  Ref. Range 05/26/2015 20:37 05/27/2015 07:23  Glucose Latest Ref Range: 65-99 mg/dL 355 (H) 732 (H)    Diabetes history: Prediabetes  Outpatient Diabetes medications: None Current orders for Inpatient glycemic control: None  Inpatient Diabetes Program Recommendations Correction (SSI): Noted in H&P that patient has a history of Prediabetes. While inpatient, please order CBGs with Novolog sensitive correction scale ACHS. HgbA1C: Fasting glucose 206 mg/dl this morning at 2:02 am. Noted A1C is in process. Diet: Please add Carb Modified to 2 gram sodium diet. Outpatient: Patient will need to follow up with PCP regarding glycemic control.  Thanks, Orlando Penner, RN, MSN, CCRN, CDE Diabetes Coordinator Inpatient Diabetes Program [phone] (Team Pager from 8am to 5pm) [phone] (AP office) [phone] [facility] office) [phone] [facility] office)

## 2015-05-29 ENCOUNTER — Other Ambulatory Visit: Payer: Self-pay | Admitting: Family Medicine

## 2015-05-30 NOTE — Discharge Summary (Signed)
Clinton Dorsey, is a 64 y.o. male  DOB 03/19/1951  MRN [id number].  Admission date:  05/26/2015  Admitting Physician  Arnaldo Natal, MD  Discharge Date:  05/30/2015   Primary MD  Vonita Moss, MD  Recommendations for primary care physician for things to follow:  Follow up with Dr.Tim Gollan in 3-4 days   Admission Diagnosis  Chest discomfort [R07.89] Coronary artery disease involving native coronary artery of native heart with angina pectoris [I25.119]   Discharge Diagnosis  Chest discomfort [R07.89] Coronary artery disease involving native coronary artery of native heart with angina pectoris [I25.119]    Active Problems:   Chest pain   Angina pectoris   Hypokalemia   Esophageal spasm   Chest discomfort   Coronary artery disease involving native coronary artery of native heart with angina pectoris with documented spasm   Hyperlipidemia      Past Medical History  Diagnosis Date  . Hypertension   . Hyperlipidemia   . Prediabetes   . Depression   . Obesity   . Coronary artery disease     a. 12/23/13 s/p PCI w/ DES to both D1 and mLCx  . MI (myocardial infarction)   . Kidney stones     Past Surgical History  Procedure Laterality Date  . Laminectomy    . Hernia repair    . Tonsillectomy    . Cardiac catheterization  12/08/2013  . Coronary angioplasty  12/08/2013    s/p stent placement  . Percutaneous coronary stent intervention (pci-s) N/A 12/23/2013    Procedure: PERCUTANEOUS CORONARY STENT INTERVENTION (PCI-S);  Surgeon: [name], MD;  Location: [facility] CATH LAB;  Service: Cardiovascular;  Laterality: N/A;       History of present illness and  Hospital Course:     Kindly see H&P for history of present illness and admission details, please review complete Labs, Consult reports and Test reports for  all details in brief  HPI  from the history and physical done on the day of admission  64 yr old male with h/o CAD,htn,HLP admitted for chest discomfort,releved with tums.due to his CAD history admitted to OBS Status TO TELE.  Hospital Course   1.chest pain due to esophageal spasm.symptoms improved with tums.troponins are negative.sen by cardio,recommended to hold HCTZ that he was taking due to hypokalemia.p advised to stop hctz til seen   by cardio in clinic.pt likely had esophageal spasm with hypokalemia.  2.hypokalemia;k was 2.9 on admision ,improved to 3.3 with replacement  3.h/o CAD;advised to continue  Coreg,effient,asa,statin.s  Discharge Condition:stable   Follow UP  Follow-up Information    Follow up with Vonita Moss, MD On 06/07/2015.   Specialty:  Family Medicine   Why:  You will see Dr. Laural Benes on Tuesday, September 20th at 9:30am    Contact information:   [address] [place] [address] [phone]         Discharge Instructions  and  Discharge Medications       Medication List    STOP taking these medications        hydrochlorothiazide 12.5 MG capsule  Commonly known as:  MICROZIDE      TAKE these medications        amLODipine 10 MG tablet  Commonly known as:  NORVASC  TAKE 1 TABLET (10 MG TOTAL) BY MOUTH DAILY.     aspirin 81 MG tablet  Take 81 mg by mouth daily.     buPROPion 150 MG 24 hr tablet  Commonly known as:  WELLBUTRIN XL  Take 150 mg by mouth daily.     carvedilol 6.25 MG tablet  Commonly known as:  COREG  TAKE 1 TABLET (6.25 MG TOTAL) BY MOUTH 2 (TWO) TIMES DAILY WITH A MEAL.     EFFIENT 10 MG Tabs tablet  Generic drug:  prasugrel  TAKE 1 TABLET (10 MG TOTAL) BY MOUTH DAILY.     losartan 100 MG tablet  Commonly known as:  COZAAR  TAKE 1 TABLET (100 MG TOTAL) BY MOUTH DAILY.     magnesium oxide 400 MG tablet  Commonly known as:  MAG-OX  Take 400 mg by mouth daily.     multivitamin capsule  Take 1 capsule by mouth  daily.     nitroGLYCERIN 0.4 MG SL tablet  Commonly known as:  NITROSTAT  Place 1 tablet (0.4 mg total) under the tongue every 5 (five) minutes as needed for chest pain.     rosuvastatin 20 MG tablet  Commonly known as:  CRESTOR  Take 20 mg by mouth daily.     VITAMIN D PO  Take 2 tablets by mouth daily.     ZETIA 10 MG tablet  Generic drug:  ezetimibe  TAKE 1 TABLET BY MOUTH EVERY DAY          Diet and Activity recommendation: See Discharge Instructions above   Consults obtained - cardio   Major procedures and Radiology Reports - PLEASE review detailed and final reports for all details, in brief -      Dg Chest 2 View  05/26/2015   CLINICAL DATA:  Complain of midsternal chest pain, radiating to left side in the neck. Fatigue for the last week.  EXAM: CHEST  2 VIEW  COMPARISON:  12/07/2013  FINDINGS: Heart size is upper limits normal. Lungs are free of focal consolidations and pleural effusions. No pulmonary edema.  IMPRESSION: No active cardiopulmonary disease.   Electronically Signed   By: Norva Pavlov M.D.   On: 05/26/2015 21:00    Micro Results    No results found for this or any previous visit (from the past 240 hour(s)).     Today   Subjective:   Clinton Dorsey today has no headache,no chest abdominal pain,no new weakness tingling or numbness, feels much better wants to go home today.  Objective:   Blood pressure 142/87, pulse 65, temperature 98.1 F (36.7 C), temperature source Oral, resp. rate 18, weight 83.85 kg (184 lb 13.7 oz), SpO2 95 %.  No intake or output data in the 24 hours ending 05/30/15 0806  Exam Awake Alert, Oriented x 3, No new F.N deficits, Normal affect [place].AT,PERRAL Supple Neck,No JVD, No cervical lymphadenopathy appriciated.  Symmetrical Chest wall movement, Good air movement bilaterally, CTAB RRR,No Gallops,Rubs or new Murmurs, No Parasternal Heave +ve B.Sounds, Abd Soft, Non tender, No organomegaly appriciated, No rebound  -guarding or rigidity. No Cyanosis, Clubbing or edema, No new Rash or bruise  Data Review   CBC w Diff:  Lab Results  Component Value Date   WBC 6.2 05/26/2015   WBC 5.5 12/07/2013   HGB 13.7 05/26/2015   HGB 13.5 12/07/2013   HCT 40.2 05/26/2015   HCT 38.8* 12/07/2013   PLT 237 05/26/2015   PLT 219 12/07/2013   LYMPHOPCT 28.9 12/07/2013   MONOPCT 10.6 12/07/2013   EOSPCT 3.5 12/07/2013   BASOPCT 0.9 12/07/2013    CMP:  Lab Results  Component Value Date   NA 141 05/27/2015   NA  140 12/09/2013   K 3.3* 05/27/2015   K 4.0 12/09/2013   CL 105 05/27/2015   CL 102 12/09/2013   CO2 27 05/27/2015   CO2 28 12/09/2013   BUN 19 05/27/2015   BUN 11 12/09/2013   CREATININE 0.82 05/27/2015   CREATININE 0.84 12/09/2013   PROT 6.8 04/05/2014   PROT 7.7 12/07/2013   ALBUMIN 4.1 12/07/2013   BILITOT 0.5 04/05/2014   BILITOT 0.6 12/07/2013   ALKPHOS 68 04/05/2014   ALKPHOS 74 12/07/2013   AST 50* 04/05/2014   AST 34 12/07/2013   ALT 48* 04/05/2014   ALT 57 12/07/2013  .   Total Time in preparing paper work, data evaluation and todays exam - 35 minutes  Amijah Timothy M.D on 9/92016 at 8:06 AM

## 2015-06-02 ENCOUNTER — Telehealth: Payer: Self-pay | Admitting: Cardiovascular Disease

## 2015-06-02 DIAGNOSIS — E785 Hyperlipidemia, unspecified: Secondary | ICD-10-CM

## 2015-06-02 NOTE — Telephone Encounter (Signed)
Patient wants to see if he should fast for tomorrow am appt with Gollan.  Let me know i will call back [facility]!!!!

## 2015-06-02 NOTE — Telephone Encounter (Signed)
He can fast if he would like to have his labs drawn tomorrow.  It would save him a trip. Thank you!

## 2015-06-02 NOTE — Telephone Encounter (Signed)
Ok.  Passed it on .  THANK YOU!!

## 2015-06-03 ENCOUNTER — Ambulatory Visit (INDEPENDENT_AMBULATORY_CARE_PROVIDER_SITE_OTHER): Payer: BLUE CROSS/BLUE SHIELD | Admitting: Cardiovascular Disease

## 2015-06-03 ENCOUNTER — Encounter: Payer: Self-pay | Admitting: Cardiovascular Disease

## 2015-06-03 VITALS — BP 152/82 | HR 73 | Ht 66.5 in | Wt 189.5 lb

## 2015-06-03 DIAGNOSIS — E1169 Type 2 diabetes mellitus with other specified complication: Secondary | ICD-10-CM | POA: Insufficient documentation

## 2015-06-03 DIAGNOSIS — Z955 Presence of coronary angioplasty implant and graft: Secondary | ICD-10-CM

## 2015-06-03 DIAGNOSIS — I10 Essential (primary) hypertension: Secondary | ICD-10-CM

## 2015-06-03 DIAGNOSIS — E1151 Type 2 diabetes mellitus with diabetic peripheral angiopathy without gangrene: Secondary | ICD-10-CM

## 2015-06-03 DIAGNOSIS — I209 Angina pectoris, unspecified: Secondary | ICD-10-CM

## 2015-06-03 DIAGNOSIS — E876 Hypokalemia: Secondary | ICD-10-CM

## 2015-06-03 DIAGNOSIS — E1159 Type 2 diabetes mellitus with other circulatory complications: Secondary | ICD-10-CM

## 2015-06-03 DIAGNOSIS — E785 Hyperlipidemia, unspecified: Secondary | ICD-10-CM

## 2015-06-03 DIAGNOSIS — E1165 Type 2 diabetes mellitus with hyperglycemia: Secondary | ICD-10-CM

## 2015-06-03 NOTE — Assessment & Plan Note (Signed)
Recommended that he stay on his anti-platelet medication

## 2015-06-03 NOTE — Assessment & Plan Note (Signed)
Hemoglobin A1c greater than 9. Poor diet. Diet changes discussed with him, recommended he have close follow-up with Dr. Dossie Arbour for medication adjustment would likely benefit from metformin or other medication

## 2015-06-03 NOTE — Assessment & Plan Note (Signed)
Order placed to have liver and lipid check today

## 2015-06-03 NOTE — Assessment & Plan Note (Signed)
Recommended he monitor his blood pressure at home and call our office with numbers HCTZ held after discharge from the hospital

## 2015-06-03 NOTE — Assessment & Plan Note (Signed)
HCTZ held, he continues to take for a low-dose potassium supplement. This could likely be held as he is not on the diarrhetic. He'll monitor his blood pressure

## 2015-06-03 NOTE — Progress Notes (Signed)
Patient ID: Clinton Dorsey, male    DOB: Apr 30, 1951, 64 y.o.   MRN: [id number]  HPI Comments: [name] is a very pleasant 64 year old gentleman with a history of hyperlipidemia, hypertension, prediabetes, strong family history of coronary artery disease, prior occasional tobacco abuse who presented to [facility] 12/07/2013 with chest pain. He ruled in for non-ST elevation MI with CK-MB of 17.7, CK of 656. He presents today for follow-up of his coronary artery disease  Recent hospitalization last week to [facility] for chest discomfort Potassium was 2.9, symptoms felt to be atypical, possible esophageal spasm. Cardiac enzymes negative, EKG normal HCTZ was held He presents today for follow-up, no further episodes of chest discomfort Significant stress at home, not sleeping well (chronic issue). Financial issues, trying to take care of another family  Weight is up, hemoglobin A1c greater than 9 Tolerating Crestor 20 g daily  Other past medical history Cardiac catheterization was performed that showed severe three-vessel CAD. He had stent for distal RCA/ostial PL branch disease estimated at 99%. He also had mid circumflex with 99% disease, severe diagonal #1 disease. Attempt to place a stent to the mid circumflex was unsuccessful and given that he had significant dye and radiation exposure, it was recommended that he have PCI of his left circumflex and diagonal vessel at a later date. Followup catheterization at [place] with successful stents to his mid circumflex and first diagonal vessel   echocardiogram in the hospital showed ejection fraction 50-55%, mildly dilated left atrium, mild TR, low to moderate MR, no focal wall motion abnormalities noted    Allergies  Allergen Reactions  . Lipitor [Atorvastatin]     Myalgias   . Penicillins     Rash   . Zocor [Simvastatin]     Myalgias     Outpatient Encounter Prescriptions as of 06/03/2015  Medication Sig  . amLODipine (NORVASC) 10 MG  tablet TAKE 1 TABLET (10 MG TOTAL) BY MOUTH DAILY.  Marland Kitchen aspirin 81 MG tablet Take 81 mg by mouth daily.  Marland Kitchen buPROPion (WELLBUTRIN XL) 150 MG 24 hr tablet Take 150 mg by mouth daily.  . carvedilol (COREG) 6.25 MG tablet TAKE 1 TABLET (6.25 MG TOTAL) BY MOUTH 2 (TWO) TIMES DAILY WITH A MEAL.  Marland Kitchen Cholecalciferol (VITAMIN D PO) Take 2 tablets by mouth daily.   . CRESTOR 20 MG tablet TAKE 1 TABLET BY MOUTH EVERY DAY  . EFFIENT 10 MG TABS tablet TAKE 1 TABLET (10 MG TOTAL) BY MOUTH DAILY.  Marland Kitchen losartan (COZAAR) 100 MG tablet TAKE 1 TABLET (100 MG TOTAL) BY MOUTH DAILY.  . magnesium oxide (MAG-OX) 400 MG tablet Take 400 mg by mouth daily.  . Multiple Vitamin (MULTIVITAMIN) capsule Take 1 capsule by mouth daily.  . nitroGLYCERIN (NITROSTAT) 0.4 MG SL tablet Place 1 tablet (0.4 mg total) under the tongue every 5 (five) minutes as needed for chest pain.  Marland Kitchen Potassium 99 MG TABS Take 99 mg by mouth daily.  Marland Kitchen ZETIA 10 MG tablet TAKE 1 TABLET BY MOUTH EVERY DAY   No facility-administered encounter medications on file as of 06/03/2015.    Past Medical History  Diagnosis Date  . Hypertension   . Hyperlipidemia   . Prediabetes   . Depression   . Obesity   . Coronary artery disease     a. 12/23/13 s/p PCI w/ DES to both D1 and mLCx  . MI (myocardial infarction)   . Kidney stones     Past Surgical History  Procedure Laterality Date  .  Laminectomy    . Hernia repair    . Tonsillectomy    . Cardiac catheterization  12/08/2013  . Coronary angioplasty  12/08/2013    s/p stent placement  . Percutaneous coronary stent intervention (pci-s) N/A 12/23/2013    Procedure: PERCUTANEOUS CORONARY STENT INTERVENTION (PCI-S);  Surgeon: [name], MD;  Location: [facility] CATH LAB;  Service: Cardiovascular;  Laterality: N/A;    Social History  reports that he quit smoking about 17 months ago. His smoking use included Cigars. He does not have any smokeless tobacco history on file. He reports that he drinks about 0.6 oz  of alcohol per week. He reports that he does not use illicit drugs.  Family History family history includes Heart attack (age of onset: 88) in his father; Heart disease in his father; Hypertension in his mother and sister.       Review of Systems  Constitutional: Negative.   Respiratory: Negative.   Cardiovascular: Negative.   Gastrointestinal: Negative.   Musculoskeletal: Negative.   Skin: Negative.   Neurological: Negative.   Hematological: Negative.   Psychiatric/Behavioral: Positive for sleep disturbance. The patient is nervous/anxious.   All other systems reviewed and are negative.   BP 152/82 mmHg  Pulse 73  Ht 5' 6.5" (1.689 m)  Wt 189 lb 8 oz (85.957 kg)  BMI 30.13 kg/m2  Physical Exam  Constitutional: He is oriented to person, place, and time. He appears well-developed and well-nourished.  HENT:  Head: Normocephalic.  Nose: Nose normal.  Mouth/Throat: Oropharynx is clear and moist.  Eyes: Conjunctivae are normal. Pupils are equal, round, and reactive to light.  Neck: Normal range of motion. Neck supple. No JVD present.  Cardiovascular: Normal rate, regular rhythm, S1 normal, S2 normal, normal heart sounds and intact distal pulses.  Exam reveals no gallop and no friction rub.   No murmur heard. Pulmonary/Chest: Effort normal and breath sounds normal. No respiratory distress. He has no wheezes. He has no rales. He exhibits no tenderness.  Abdominal: Soft. Bowel sounds are normal. He exhibits no distension. There is no tenderness.  Musculoskeletal: Normal range of motion. He exhibits no edema or tenderness.  Lymphadenopathy:    He has no cervical adenopathy.  Neurological: He is alert and oriented to person, place, and time. Coordination normal.  Skin: Skin is warm and dry. No rash noted. No erythema.  Psychiatric: He has a normal mood and affect. His behavior is normal. Judgment and thought content normal.      Assessment and Plan   Nursing note and vitals  reviewed.

## 2015-06-03 NOTE — Assessment & Plan Note (Signed)
Currently with no symptoms of chest pain. No further workup at this time Recent atypical symptoms likely in the setting of low potassium from HCTZ

## 2015-06-03 NOTE — Patient Instructions (Signed)
You are doing well. No medication changes were made.  Watch the carbs, We will check labs today, lipids, LFTs  Monitor your blood pressure, call if elevated  Please call us if you have new issues that need to be addressed before your next appt.  Your physician wants you to follow-up in: 6 months.  You will receive a reminder letter in the mail two months in advance. If you don't receive a letter, please call our office to schedule the follow-up appointment.

## 2015-06-04 LAB — HEPATIC FUNCTION PANEL
ALT: 37 IU/L (ref 0–44)
AST: 26 IU/L (ref 0–40)
Albumin: 4.5 g/dL (ref 3.6–4.8)
Alkaline Phosphatase: 72 IU/L (ref 39–117)
Bilirubin Total: 0.5 mg/dL (ref 0.0–1.2)
Bilirubin, Direct: 0.2 mg/dL (ref 0.00–0.40)
Total Protein: 7.3 g/dL (ref 6.0–8.5)

## 2015-06-04 LAB — LIPID PANEL
Chol/HDL Ratio: 3.2 ratio units (ref 0.0–5.0)
Cholesterol, Total: 134 mg/dL (ref 100–199)
HDL: 42 mg/dL (ref >39)
LDL Calculated: 60 mg/dL (ref 0–99)
Triglycerides: 160 mg/dL — ABNORMAL HIGH (ref 0–149)
VLDL Cholesterol Cal: 32 mg/dL (ref 5–40)

## 2015-06-07 ENCOUNTER — Inpatient Hospital Stay: Payer: Self-pay | Admitting: Family Medicine

## 2015-06-23 ENCOUNTER — Encounter: Payer: Self-pay | Admitting: Family Medicine

## 2015-06-23 ENCOUNTER — Ambulatory Visit (INDEPENDENT_AMBULATORY_CARE_PROVIDER_SITE_OTHER): Payer: BLUE CROSS/BLUE SHIELD | Admitting: Family Medicine

## 2015-06-23 VITALS — BP 158/89 | HR 71 | Temp 98.8°F | Ht 65.8 in | Wt 186.0 lb

## 2015-06-23 DIAGNOSIS — E1159 Type 2 diabetes mellitus with other circulatory complications: Secondary | ICD-10-CM | POA: Diagnosis not present

## 2015-06-23 DIAGNOSIS — E1165 Type 2 diabetes mellitus with hyperglycemia: Secondary | ICD-10-CM

## 2015-06-23 DIAGNOSIS — I2510 Atherosclerotic heart disease of native coronary artery without angina pectoris: Secondary | ICD-10-CM | POA: Diagnosis not present

## 2015-06-23 DIAGNOSIS — Z23 Encounter for immunization: Secondary | ICD-10-CM

## 2015-06-23 DIAGNOSIS — E119 Type 2 diabetes mellitus without complications: Secondary | ICD-10-CM

## 2015-06-23 DIAGNOSIS — I10 Essential (primary) hypertension: Secondary | ICD-10-CM

## 2015-06-23 MED ORDER — GLUCOSE BLOOD VI STRP: ORAL_STRIP | Status: DC

## 2015-06-23 MED ORDER — METFORMIN HCL 500 MG PO TABS: 500.0000 mg | ORAL_TABLET | Freq: Every day | ORAL | Status: DC

## 2015-06-23 MED ORDER — ONETOUCH ULTRASOFT LANCETS MISC: Status: DC

## 2015-06-23 NOTE — Progress Notes (Signed)
BP 158/89 mmHg  Pulse 71  Temp(Src) 98.8 F (37.1 C)  Ht 5' 5.8" (1.671 m)  Wt 186 lb (84.369 kg)  BMI 30.22 kg/m2  SpO2 96%   Subjective:    Patient ID: Clinton Dorsey, male    DOB: 04-29-1951, 63 y.o.   MRN: [id number]  HPI: Clinton Dorsey is a 65 y.o. male  Chief Complaint  Patient presents with  . Hospitalization Follow-up    low potassium   reviewed notes from hospital and cardiology had normal liver function and cholesterol with Crestor. Patient doing well with Crestor no side effects no memory issues. Patient has not had follow-up BMP from hospital which showed potassium improving. Patient has stayed off of chlorothiazide Blood pressures up in the office but is been good with home monitoring. Main issue is his glucose is elevated with A1c 9.5. Discussed diet exercise nutrition starting metformin. Discussed home glucose checks  Relevant past medical, surgical, family and social history reviewed and updated as indicated. Interim medical history since our last visit reviewed. Allergies and medications reviewed and updated.  Review of Systems  Constitutional: Negative.   Respiratory: Negative.   Cardiovascular: Negative.     Per HPI unless specifically indicated above     Objective:    BP 158/89 mmHg  Pulse 71  Temp(Src) 98.8 F (37.1 C)  Ht 5' 5.8" (1.671 m)  Wt 186 lb (84.369 kg)  BMI 30.22 kg/m2  SpO2 96%  Wt Readings from Last 3 Encounters:  06/23/15 186 lb (84.369 kg)  08/24/14 188 lb (85.276 kg)  06/03/15 189 lb 8 oz (85.957 kg)    Physical Exam  Constitutional: He is oriented to person, place, and time. He appears well-developed and well-nourished. No distress.  HENT:  Head: Normocephalic and atraumatic.  Right Ear: Hearing normal.  Left Ear: Hearing normal.  Nose: Nose normal.  Eyes: Conjunctivae and lids are normal. Right eye exhibits no discharge. Left eye exhibits no discharge. No scleral icterus.  Cardiovascular: Normal rate, regular  rhythm and normal heart sounds.   Pulmonary/Chest: Effort normal and breath sounds normal. No respiratory distress.  Musculoskeletal: Normal range of motion.  Neurological: He is alert and oriented to person, place, and time.  Skin: Skin is intact. No rash noted.  Psychiatric: He has a normal mood and affect. His speech is normal and behavior is normal. Judgment and thought content normal. Cognition and memory are normal.    Results for orders placed or performed in visit on 06/03/15  Lipid Profile  Result Value Ref Range   Cholesterol, Total 134 100 - 199 mg/dL   Triglycerides 409 (H) 0 - 149 mg/dL   HDL 42 >81 mg/dL   VLDL Cholesterol Cal 32 5 - 40 mg/dL   LDL Calculated 60 0 - 99 mg/dL   Chol/HDL Ratio 3.2 0.0 - 5.0 ratio units  Hepatic function panel  Result Value Ref Range   Total Protein 7.3 6.0 - 8.5 g/dL   Albumin 4.5 3.6 - 4.8 g/dL   Bilirubin Total 0.5 0.0 - 1.2 mg/dL   Bilirubin, Direct 1.91 0.00 - 0.40 mg/dL   Alkaline Phosphatase 72 39 - 117 IU/L   AST 26 0 - 40 IU/L   ALT 37 0 - 44 IU/L      Assessment & Plan:   Problem List Items Addressed This Visit      Cardiovascular and Mediastinum   Coronary atherosclerosis of native coronary artery (Chronic)   Relevant Orders   Basic metabolic panel  Essential hypertension    Discussed need for better control checking at home if not coming down we'll reevaluate      Poorly controlled type 2 diabetes mellitus with circulatory disorder (HCC)   Relevant Medications   metFORMIN (GLUCOPHAGE) 500 MG tablet     Endocrine   Diabetes mellitus without complication (HCC)    Will start metformin 500 at bedtime Check blood sugars If staying high will reevaluate      Relevant Medications   metFORMIN (GLUCOPHAGE) 500 MG tablet   glucose blood test strip   Lancets (ONETOUCH ULTRASOFT) lancets    Other Visit Diagnoses    Immunization due    -  Primary    Relevant Orders    Flu Vaccine QUAD 36+ mos PF IM (Fluarix &  Fluzone Quad PF) (Completed)        Follow up plan: Return in about 3 months (around 09/23/2015) for Physical Exam including hemoglobin A1c.

## 2015-06-23 NOTE — Assessment & Plan Note (Signed)
Will start metformin 500 at bedtime Check blood sugars If staying high will reevaluate

## 2015-06-23 NOTE — Assessment & Plan Note (Signed)
Discussed need for better control checking at home if not coming down we'll reevaluate

## 2015-06-24 ENCOUNTER — Encounter: Payer: Self-pay | Admitting: Family Medicine

## 2015-06-24 LAB — BASIC METABOLIC PANEL
BUN/Creatinine Ratio: 24 — ABNORMAL HIGH (ref 10–22)
BUN: 22 mg/dL (ref 8–27)
CO2: 27 mmol/L (ref 18–29)
Calcium: 9.6 mg/dL (ref 8.6–10.2)
Chloride: 99 mmol/L (ref 97–108)
Creatinine, Ser: 0.91 mg/dL (ref 0.76–1.27)
GFR calc Af Amer: 103 mL/min/{1.73_m2} (ref >59)
GFR calc non Af Amer: 89 mL/min/{1.73_m2} (ref >59)
Glucose: 160 mg/dL — ABNORMAL HIGH (ref 65–99)
Potassium: 3.6 mmol/L (ref 3.5–5.2)
Sodium: 142 mmol/L (ref 134–144)

## 2015-07-05 ENCOUNTER — Telehealth: Payer: Self-pay | Admitting: Family Medicine

## 2015-07-05 MED ORDER — TAMSULOSIN HCL 0.4 MG PO CAPS: 0.4000 mg | ORAL_CAPSULE | Freq: Every day | ORAL | Status: DC

## 2015-07-05 MED ORDER — OXYCODONE-ACETAMINOPHEN 5-325 MG PO TABS: 1.0000 | ORAL_TABLET | Freq: Three times a day (TID) | ORAL | Status: DC | PRN

## 2015-07-05 NOTE — Telephone Encounter (Signed)
Pt has a kidney stones and would like to have something sent to [facility].

## 2015-07-05 NOTE — Telephone Encounter (Signed)
Phone call Discussed with patient history of kidney stones Has been able to 6 stressful he passed stones in the past Wants some pain medicine to hold him over We'll give Flomax and Percocet

## 2015-07-05 NOTE — Telephone Encounter (Signed)
Call pt 

## 2015-07-08 ENCOUNTER — Emergency Department
Admission: EM | Admit: 2015-07-08 | Discharge: 2015-07-08 | Disposition: A | Discharge status: Home / Self Care | Payer: BLUE CROSS/BLUE SHIELD | Attending: Student | Admitting: Student

## 2015-07-08 ENCOUNTER — Encounter: Payer: Self-pay | Admitting: Emergency Medicine

## 2015-07-08 ENCOUNTER — Emergency Department: Payer: BLUE CROSS/BLUE SHIELD

## 2015-07-08 DIAGNOSIS — Y92410 Unspecified street and highway as the place of occurrence of the external cause: Secondary | ICD-10-CM | POA: Insufficient documentation

## 2015-07-08 DIAGNOSIS — Z7982 Long term (current) use of aspirin: Secondary | ICD-10-CM | POA: Insufficient documentation

## 2015-07-08 DIAGNOSIS — S3992XA Unspecified injury of lower back, initial encounter: Secondary | ICD-10-CM | POA: Diagnosis present

## 2015-07-08 DIAGNOSIS — S39012A Strain of muscle, fascia and tendon of lower back, initial encounter: Secondary | ICD-10-CM | POA: Insufficient documentation

## 2015-07-08 DIAGNOSIS — Z87891 Personal history of nicotine dependence: Secondary | ICD-10-CM | POA: Insufficient documentation

## 2015-07-08 DIAGNOSIS — Y998 Other external cause status: Secondary | ICD-10-CM | POA: Diagnosis not present

## 2015-07-08 DIAGNOSIS — Z79899 Other long term (current) drug therapy: Secondary | ICD-10-CM | POA: Diagnosis not present

## 2015-07-08 DIAGNOSIS — Z880 Allergy status to penicillin: Secondary | ICD-10-CM | POA: Diagnosis not present

## 2015-07-08 DIAGNOSIS — E119 Type 2 diabetes mellitus without complications: Secondary | ICD-10-CM | POA: Insufficient documentation

## 2015-07-08 DIAGNOSIS — I10 Essential (primary) hypertension: Secondary | ICD-10-CM | POA: Insufficient documentation

## 2015-07-08 DIAGNOSIS — Y9389 Activity, other specified: Secondary | ICD-10-CM | POA: Diagnosis not present

## 2015-07-08 DIAGNOSIS — M5136 Other intervertebral disc degeneration, lumbar region: Secondary | ICD-10-CM | POA: Diagnosis not present

## 2015-07-08 DIAGNOSIS — Z7984 Long term (current) use of oral hypoglycemic drugs: Secondary | ICD-10-CM | POA: Insufficient documentation

## 2015-07-08 MED ORDER — CYCLOBENZAPRINE HCL 5 MG PO TABS: 5.0000 mg | ORAL_TABLET | Freq: Three times a day (TID) | ORAL | Status: DC | PRN

## 2015-07-08 MED ORDER — TRAMADOL HCL 50 MG PO TABS: 50.0000 mg | ORAL_TABLET | Freq: Four times a day (QID) | ORAL | Status: DC | PRN

## 2015-07-08 MED ORDER — IBUPROFEN 800 MG PO TABS: 800.0000 mg | ORAL_TABLET | Freq: Three times a day (TID) | ORAL | Status: DC | PRN
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
## 2015-07-08 NOTE — ED Notes (Signed)
Patient with no complaints at this time. Respirations even and unlabored. Skin warm/dry. Discharge instructions reviewed with patient at this time. Patient given opportunity to voice concerns/ask questions. Patient discharged at this time and left Emergency Department with steady gait.   

## 2015-08-28 ENCOUNTER — Other Ambulatory Visit: Payer: Self-pay | Admitting: Family Medicine

## 2015-09-15 ENCOUNTER — Other Ambulatory Visit: Payer: Self-pay | Admitting: Cardiovascular Disease

## 2015-09-29 ENCOUNTER — Telehealth: Payer: Self-pay | Admitting: Family Medicine

## 2015-09-29 NOTE — Telephone Encounter (Signed)
[facility] called stated pt would like an RX sent in for a One Touch Ultra meter and One Touch Delica lancets. [facility] [address] in [place]. Thanks.

## 2015-10-13 ENCOUNTER — Ambulatory Visit (INDEPENDENT_AMBULATORY_CARE_PROVIDER_SITE_OTHER): Payer: BLUE CROSS/BLUE SHIELD | Admitting: Family Medicine

## 2015-10-13 ENCOUNTER — Encounter: Payer: Self-pay | Admitting: Family Medicine

## 2015-10-13 VITALS — BP 169/90 | HR 69 | Temp 98.0°F | Ht 66.0 in | Wt 183.0 lb

## 2015-10-13 DIAGNOSIS — I10 Essential (primary) hypertension: Secondary | ICD-10-CM | POA: Diagnosis not present

## 2015-10-13 DIAGNOSIS — I2583 Coronary atherosclerosis due to lipid rich plaque: Secondary | ICD-10-CM

## 2015-10-13 DIAGNOSIS — E785 Hyperlipidemia, unspecified: Secondary | ICD-10-CM | POA: Diagnosis not present

## 2015-10-13 DIAGNOSIS — E119 Type 2 diabetes mellitus without complications: Secondary | ICD-10-CM | POA: Diagnosis not present

## 2015-10-13 DIAGNOSIS — Z1211 Encounter for screening for malignant neoplasm of colon: Secondary | ICD-10-CM | POA: Diagnosis not present

## 2015-10-13 DIAGNOSIS — Z0000 Encounter for general adult medical examination without abnormal findings: Secondary | ICD-10-CM | POA: Diagnosis not present

## 2015-10-13 DIAGNOSIS — I2510 Atherosclerotic heart disease of native coronary artery without angina pectoris: Secondary | ICD-10-CM

## 2015-10-13 DIAGNOSIS — Z113 Encounter for screening for infections with a predominantly sexual mode of transmission: Secondary | ICD-10-CM | POA: Diagnosis not present

## 2015-10-13 LAB — URINALYSIS, ROUTINE W REFLEX MICROSCOPIC
Bilirubin, UA: NEGATIVE
Glucose, UA: NEGATIVE
Ketones, UA: NEGATIVE
Leukocytes, UA: NEGATIVE
Nitrite, UA: NEGATIVE
Specific Gravity, UA: 1.015 (ref 1.005–1.030)
Urobilinogen, Ur: 0.2 mg/dL (ref 0.2–1.0)
pH, UA: 6 (ref 5.0–7.5)

## 2015-10-13 LAB — MICROSCOPIC EXAMINATION

## 2015-10-13 LAB — BAYER DCA HB A1C WAIVED: HB A1C (BAYER DCA - WAIVED): 8.5 % — ABNORMAL HIGH (ref <7.0)

## 2015-10-13 MED ORDER — EZETIMIBE 10 MG PO TABS: 10.0000 mg | ORAL_TABLET | Freq: Every day | ORAL | Status: DC

## 2015-10-13 MED ORDER — METFORMIN HCL 500 MG PO TABS: 500.0000 mg | ORAL_TABLET | Freq: Every day | ORAL | Status: DC

## 2015-10-13 MED ORDER — BUPROPION HCL ER (XL) 150 MG PO TB24: 150.0000 mg | ORAL_TABLET | Freq: Every day | ORAL | Status: DC

## 2015-10-13 MED ORDER — ROSUVASTATIN CALCIUM 20 MG PO TABS: 20.0000 mg | ORAL_TABLET | Freq: Every day | ORAL | Status: DC

## 2015-10-13 NOTE — Assessment & Plan Note (Signed)
The current medical regimen is effective;  continue present plan and medications.  

## 2015-10-13 NOTE — Progress Notes (Signed)
BP 169/90 mmHg  Pulse 69  Temp(Src) 98 F (36.7 C)  Ht  (1.676 m)  Wt 183 lb (83.008 kg)  BMI 29.55 kg/m2  SpO2 96%   Subjective:    Patient ID: Clinton Dorsey, male    DOB: [birth year], 65 y.o.   MRN: [id number]  HPI: Clinton Dorsey is a 65 y.o. male  Chief Complaint  Patient presents with  . Annual Exam    Relevant past medical, surgical, family and social history reviewed and updated as indicated. Interim medical history since our last visit reviewed. Allergies and medications reviewed and updated.  Review of Systems  Constitutional: Negative.   HENT: Negative.   Eyes: Negative.   Respiratory: Negative.   Cardiovascular: Negative.   Gastrointestinal: Negative.   Endocrine: Negative.   Genitourinary: Negative.   Musculoskeletal: Negative.   Skin: Negative.   Allergic/Immunologic: Negative.   Neurological: Negative.   Hematological: Negative.   Psychiatric/Behavioral: Negative.     Per HPI unless specifically indicated above     Objective:    BP 169/90 mmHg  Pulse 69  Temp(Src) 98 F (36.7 C)  Ht  (1.676 m)  Wt 183 lb (83.008 kg)  BMI 29.55 kg/m2  SpO2 96%  Wt Readings from Last 3 Encounters:  10/13/15 183 lb (83.008 kg)  07/08/15 183 lb (83.008 kg)  06/23/15 186 lb (84.369 kg)    Physical Exam  Constitutional: He is oriented to person, place, and time. He appears well-developed and well-nourished.  HENT:  Head: Normocephalic and atraumatic.  Right Ear: External ear normal.  Left Ear: External ear normal.  Eyes: Conjunctivae and EOM are normal. Pupils are equal, round, and reactive to light.  Neck: Normal range of motion. Neck supple.  Cardiovascular: Normal rate, regular rhythm, normal heart sounds and intact distal pulses.   Pulmonary/Chest: Effort normal and breath sounds normal.  Abdominal: Soft. Bowel sounds are normal. There is no splenomegaly or hepatomegaly.  Genitourinary: Rectum normal, prostate normal and penis normal.   Musculoskeletal: Normal range of motion.  Neurological: He is alert and oriented to person, place, and time. He has normal reflexes.  Skin: No rash noted. No erythema.  Psychiatric: He has a normal mood and affect. His behavior is normal. Judgment and thought content normal.    Results for orders placed or performed in visit on 06/23/15  Basic metabolic panel  Result Value Ref Range   Glucose 160 (H) 65 - 99 mg/dL   BUN 22 8 - 27 mg/dL   Creatinine, Ser 1.47 0.76 - 1.27 mg/dL   GFR calc non Af Amer 89 >59 mL/min/1.73   GFR calc Af Amer 103 >59 mL/min/1.73   BUN/Creatinine Ratio 24 (H) 10 - 22   Sodium 142 134 - 144 mmol/L   Potassium 3.6 3.5 - 5.2 mmol/L   Chloride 99 97 - 108 mmol/L   CO2 27 18 - 29 mmol/L   Calcium 9.6 8.6 - 10.2 mg/dL      Assessment & Plan:   Problem List Items Addressed This Visit      Cardiovascular and Mediastinum   Essential hypertension    Discussed elevated blood pressure need for better control patient started 2 weeks ago and extensive lifestyle program Patient has appointment with cardiology in 1 month Will follow-up here in 2 months to assess blood pressure control in the meantime patient will check blood pressure and if not coming down we will notify us to go ahead and start medication.  Relevant Medications   rosuvastatin (CRESTOR) 20 MG tablet   ezetimibe (ZETIA) 10 MG tablet   Coronary artery disease    The current medical regimen is effective;  continue present plan and medications.       Relevant Medications   rosuvastatin (CRESTOR) 20 MG tablet   ezetimibe (ZETIA) 10 MG tablet     Endocrine   Diabetes mellitus without complication (HCC)   Relevant Medications   rosuvastatin (CRESTOR) 20 MG tablet   metFORMIN (GLUCOPHAGE) 500 MG tablet   Other Relevant Orders   Bayer DCA Hb A1c Waived     Other   Hyperlipemia    The current medical regimen is effective;  continue present plan and medications.       Relevant  Medications   rosuvastatin (CRESTOR) 20 MG tablet   ezetimibe (ZETIA) 10 MG tablet    Other Visit Diagnoses    Routine general medical examination at a health care facility    -  Primary    Relevant Orders    CBC with Differential/Platelet    Comprehensive metabolic panel    Lipid Panel w/o Chol/HDL Ratio    PSA    TSH    Urinalysis, Routine w reflex microscopic (not at [facility])    Uric acid    Routine screening for STI (sexually transmitted infection)        Relevant Orders    Hepatitis C Antibody    Colon cancer screening        Relevant Orders    Ambulatory referral to General Surgery        Follow up plan: Return in about 2 months (around 12/11/2015), or if symptoms worsen or fail to improve, for BP check.

## 2015-10-13 NOTE — Assessment & Plan Note (Signed)
Discussed elevated blood pressure need for better control patient started 2 weeks ago and extensive lifestyle program Patient has appointment with cardiology in 1 month Will follow-up here in 2 months to assess blood pressure control in the meantime patient will check blood pressure and if not coming down we will notify us to go ahead and start medication.

## 2015-10-14 ENCOUNTER — Other Ambulatory Visit: Payer: Self-pay

## 2015-10-14 ENCOUNTER — Telehealth: Payer: Self-pay | Admitting: *Deleted

## 2015-10-14 ENCOUNTER — Telehealth: Payer: Self-pay

## 2015-10-14 LAB — CBC WITH DIFFERENTIAL/PLATELET
Basophils Absolute: 0 10*3/uL (ref 0.0–0.2)
Basos: 0 %
EOS (ABSOLUTE): 0.1 10*3/uL (ref 0.0–0.4)
Eos: 2 %
Hematocrit: 42.6 % (ref 37.5–51.0)
Hemoglobin: 14.4 g/dL (ref 12.6–17.7)
Immature Grans (Abs): 0 10*3/uL (ref 0.0–0.1)
Immature Granulocytes: 0 %
Lymphocytes Absolute: 1.6 10*3/uL (ref 0.7–3.1)
Lymphs: 31 %
MCH: 29.4 pg (ref 26.6–33.0)
MCHC: 33.8 g/dL (ref 31.5–35.7)
MCV: 87 fL (ref 79–97)
Monocytes Absolute: 0.5 10*3/uL (ref 0.1–0.9)
Monocytes: 9 %
Neutrophils Absolute: 3.1 10*3/uL (ref 1.4–7.0)
Neutrophils: 58 %
Platelets: 252 10*3/uL (ref 150–379)
RBC: 4.89 x10E6/uL (ref 4.14–5.80)
RDW: 13.5 % (ref 12.3–15.4)
WBC: 5.3 10*3/uL (ref 3.4–10.8)

## 2015-10-14 LAB — COMPREHENSIVE METABOLIC PANEL
ALT: 38 IU/L (ref 0–44)
AST: 27 IU/L (ref 0–40)
Albumin/Globulin Ratio: 1.9 (ref 1.1–2.5)
Albumin: 4.7 g/dL (ref 3.6–4.8)
Alkaline Phosphatase: 70 IU/L (ref 39–117)
BUN/Creatinine Ratio: 20 (ref 10–22)
BUN: 18 mg/dL (ref 8–27)
Bilirubin Total: 0.7 mg/dL (ref 0.0–1.2)
CO2: 27 mmol/L (ref 18–29)
Calcium: 9.3 mg/dL (ref 8.6–10.2)
Chloride: 99 mmol/L (ref 96–106)
Creatinine, Ser: 0.91 mg/dL (ref 0.76–1.27)
GFR calc Af Amer: 103 mL/min/{1.73_m2} (ref >59)
GFR calc non Af Amer: 89 mL/min/{1.73_m2} (ref >59)
Globulin, Total: 2.5 g/dL (ref 1.5–4.5)
Glucose: 115 mg/dL — ABNORMAL HIGH (ref 65–99)
Potassium: 3.4 mmol/L — ABNORMAL LOW (ref 3.5–5.2)
Sodium: 142 mmol/L (ref 134–144)
Total Protein: 7.2 g/dL (ref 6.0–8.5)

## 2015-10-14 LAB — PSA: Prostate Specific Ag, Serum: 1.4 ng/mL (ref 0.0–4.0)

## 2015-10-14 LAB — LIPID PANEL W/O CHOL/HDL RATIO
Cholesterol, Total: 104 mg/dL (ref 100–199)
HDL: 40 mg/dL (ref >39)
LDL Calculated: 46 mg/dL (ref 0–99)
Triglycerides: 92 mg/dL (ref 0–149)
VLDL Cholesterol Cal: 18 mg/dL (ref 5–40)

## 2015-10-14 LAB — URIC ACID: Uric Acid: 3.6 mg/dL — ABNORMAL LOW (ref 3.7–8.6)

## 2015-10-14 LAB — HEPATITIS C ANTIBODY: Hep C Virus Ab: <0.1 s/co ratio (ref 0.0–0.9)

## 2015-10-14 LAB — TSH: TSH: 2.24 u[IU]/mL (ref 0.450–4.500)

## 2015-10-14 NOTE — Telephone Encounter (Signed)
Pt called back, states he just took his BP 131/81, HR 57

## 2015-10-14 NOTE — Telephone Encounter (Signed)
Pt saw Dr. Dossie Arbour yesterday who advised:  Essential hypertension - Steele Sizer, MD at 10/13/2015 4:06 PM          Discussed elevated blood pressure need for better control patient started 2 weeks ago and extensive lifestyle program Patient has appointment with cardiology in 1 month Will follow-up here in 2 months to assess blood pressure control in the meantime patient will check blood pressure and if not coming down we will notify us to go ahead and start medication.         Pt's wife wanted to make Dr. Mariah Milling aware and see if he had any other recommendations.

## 2015-10-14 NOTE — Telephone Encounter (Signed)
Gastroenterology Pre-Procedure Review  Request Date: 11/25/15 Requesting Physician: Dr. Dossie Arbour  PATIENT REVIEW QUESTIONS: The patient responded to the following health history questions as indicated:    1. Are you having any GI issues? no 2. Do you have a personal history of Polyps? no 3. Do you have a family history of Colon Cancer or Polyps? no 4. Diabetes Mellitus? yes (Type 2) 5. Joint replacements in the past 12 months?no 6. Major health problems in the past 3 months?no 7. Any artificial heart valves, MVP, or defibrillator?yes (Stents x 3 years)    MEDICATIONS & ALLERGIES:    Patient reports the following regarding taking any anticoagulation/antiplatelet therapy:   Plavix, Coumadin, Eliquis, Xarelto, Lovenox, Pradaxa, Brilinta, or Effient? yes (blood) Aspirin? yes (ASA 81mg )  Patient confirms/reports the following medications:  Current Outpatient Prescriptions  Medication Sig Dispense Refill  . amLODipine (NORVASC) 10 MG tablet TAKE 1 TABLET (10 MG TOTAL) BY MOUTH DAILY. 90 tablet 3  . aspirin 81 MG tablet Take 81 mg by mouth daily.    Marland Kitchen buPROPion (WELLBUTRIN XL) 150 MG 24 hr tablet Take 1 tablet (150 mg total) by mouth daily. 90 tablet 4  . carvedilol (COREG) 6.25 MG tablet TAKE 1 TABLET (6.25 MG TOTAL) BY MOUTH 2 (TWO) TIMES DAILY WITH A MEAL. 180 tablet 3  . Cholecalciferol (VITAMIN D PO) Take 2 tablets by mouth daily.     . cyclobenzaprine (FLEXERIL) 5 MG tablet Take 1 tablet (5 mg total) by mouth every 8 (eight) hours as needed for muscle spasms. (Patient not taking: Reported on 10/13/2015) 30 tablet 1  . EFFIENT 10 MG TABS tablet TAKE 1 TABLET (10 MG TOTAL) BY MOUTH DAILY. 90 tablet 3  . ezetimibe (ZETIA) 10 MG tablet Take 1 tablet (10 mg total) by mouth daily. 90 tablet 4  . ibuprofen (ADVIL,MOTRIN) 800 MG tablet Take 1 tablet (800 mg total) by mouth every 8 (eight) hours as needed. 30 tablet 0  . losartan (COZAAR) 100 MG tablet TAKE 1 TABLET (100 MG TOTAL) BY MOUTH DAILY.  90 tablet 3  . magnesium oxide (MAG-OX) 400 MG tablet Take 400 mg by mouth daily.    . metFORMIN (GLUCOPHAGE) 500 MG tablet Take 1 tablet (500 mg total) by mouth daily at 10 pm. 90 tablet 4  . Multiple Vitamin (MULTIVITAMIN) capsule Take 1 capsule by mouth daily.    Marland Kitchen NITROSTAT 0.4 MG SL tablet PLACE 1 TABLET UNDER THE TONGUE EVERY 5 MINUTES AS NEEDED FOR CHEST PAIN 25 tablet 3  . Potassium 99 MG TABS Take 99 mg by mouth daily.    . rosuvastatin (CRESTOR) 10 MG tablet Take 20 mg by mouth daily.    . rosuvastatin (CRESTOR) 20 MG tablet Take 1 tablet (20 mg total) by mouth daily. 90 tablet 4  . vitamin E 400 UNIT capsule Take 400 Units by mouth daily.     No current facility-administered medications for this visit.    Patient confirms/reports the following allergies:  Allergies  Allergen Reactions  . Lipitor [Atorvastatin]     Myalgias   . Penicillins     Rash   . Zocor [Simvastatin]     Myalgias     No orders of the defined types were placed in this encounter.    AUTHORIZATION INFORMATION Primary Insurance: 1D#: Group #:  Secondary Insurance: 1D#: Group #:  SCHEDULE INFORMATION: Date: 11/25/15 Time: Location: [facility]

## 2015-10-14 NOTE — Telephone Encounter (Signed)
Pt c/o BP issue: STAT if pt c/o blurred vision, one-sided weakness or slurred speech  1. What are your last 5 BP readings?  10/13/15: 160/93 HR 30 beats and then a pause then 30 beats (in one minute) Right now: 168/96 HR 65   2. Are you having any other symptoms (ex. Dizziness, headache, blurred vision, passed out)? No   3. What is your BP issue? Bp is running a bit high, just started Weight watchers and he drastically changed his diet not sure if this could effect it. Wife is a Engineer, civil (consulting) and told him to call us just to make sure he is oka.   Denies SOB,CP

## 2015-10-17 ENCOUNTER — Encounter: Payer: Self-pay | Admitting: Family Medicine

## 2015-10-28 ENCOUNTER — Encounter: Payer: Self-pay | Admitting: Physician Assistant

## 2015-10-28 ENCOUNTER — Other Ambulatory Visit
Admission: RE | Admit: 2015-10-28 | Discharge: 2015-10-28 | Disposition: A | Discharge status: Home / Self Care | Payer: BLUE CROSS/BLUE SHIELD | Source: Ambulatory Visit | Attending: Physician Assistant | Admitting: Physician Assistant

## 2015-10-28 ENCOUNTER — Ambulatory Visit (INDEPENDENT_AMBULATORY_CARE_PROVIDER_SITE_OTHER): Payer: BLUE CROSS/BLUE SHIELD | Admitting: Physician Assistant

## 2015-10-28 ENCOUNTER — Other Ambulatory Visit: Payer: Self-pay

## 2015-10-28 VITALS — BP 154/98 | HR 68 | Ht 66.0 in | Wt 180.5 lb

## 2015-10-28 DIAGNOSIS — Z720 Tobacco use: Secondary | ICD-10-CM | POA: Insufficient documentation

## 2015-10-28 DIAGNOSIS — R002 Palpitations: Secondary | ICD-10-CM

## 2015-10-28 DIAGNOSIS — E876 Hypokalemia: Secondary | ICD-10-CM | POA: Diagnosis not present

## 2015-10-28 DIAGNOSIS — I2510 Atherosclerotic heart disease of native coronary artery without angina pectoris: Secondary | ICD-10-CM | POA: Insufficient documentation

## 2015-10-28 DIAGNOSIS — E669 Obesity, unspecified: Secondary | ICD-10-CM

## 2015-10-28 DIAGNOSIS — I10 Essential (primary) hypertension: Secondary | ICD-10-CM

## 2015-10-28 DIAGNOSIS — F32A Depression, unspecified: Secondary | ICD-10-CM | POA: Insufficient documentation

## 2015-10-28 DIAGNOSIS — I340 Nonrheumatic mitral (valve) insufficiency: Secondary | ICD-10-CM

## 2015-10-28 DIAGNOSIS — E785 Hyperlipidemia, unspecified: Secondary | ICD-10-CM

## 2015-10-28 DIAGNOSIS — F329 Major depressive disorder, single episode, unspecified: Secondary | ICD-10-CM | POA: Insufficient documentation

## 2015-10-28 LAB — BASIC METABOLIC PANEL
Anion gap: 6 (ref 5–15)
BUN: 18 mg/dL (ref 6–20)
CO2: 35 mmol/L — ABNORMAL HIGH (ref 22–32)
Calcium: 9.8 mg/dL (ref 8.9–10.3)
Chloride: 99 mmol/L — ABNORMAL LOW (ref 101–111)
Creatinine, Ser: 0.91 mg/dL (ref 0.61–1.24)
GFR calc Af Amer: >60 mL/min (ref >60)
GFR calc non Af Amer: >60 mL/min (ref >60)
Glucose, Bld: 123 mg/dL — ABNORMAL HIGH (ref 65–99)
Potassium: 3.4 mmol/L — ABNORMAL LOW (ref 3.5–5.1)
Sodium: 140 mmol/L (ref 135–145)

## 2015-10-28 LAB — MAGNESIUM: Magnesium: 2.2 mg/dL (ref 1.7–2.4)

## 2015-10-28 MED ORDER — POTASSIUM CHLORIDE CRYS ER 20 MEQ PO TBCR: 20.0000 meq | EXTENDED_RELEASE_TABLET | Freq: Every day | ORAL | Status: DC

## 2015-10-28 NOTE — Progress Notes (Signed)
Cardiology Office Note Date:  10/28/2015  Patient ID:  Clinton Dorsey, DOB November 07, 1950, MRN [id number] PCP:  Vonita Moss, MD  Cardiologist:  Dr. Mariah Milling, MD    Chief Complaint: Palpitations  History of Present Illness: Clinton Dorsey is a 65 y.o. male with history of severe 3 vessel CAD with history of NSTEMI 11/2013 s/p stagged PCI LCx and D1, poorly controlled diabetes, HLD, HTN, strong family history of CAD, tobacco abuse, obesity, prior history of severe hypokalemia, and depression who presents for evaluation of palpitations in the setting of nausea and vomiting.   Patient was admitted to [facility] 11/2013 with chest pain. He ruled in for a NSTEMI with a troponin of 0.08, up trending, CK-MB 12.7, CK 656, BNP 197. Echo 12/08/2013 showed EF of 55-60%, mild LVH, mildly dilated LA, mild TR, mild to moderate MR, RVSP mildly elevated. He underwemt cardiac cath that showed D1 80%, LCx 90% with failed attempt to place stent, distal RCA/ostial PL branch 99% s/p PCI/DES there was no stenosis. EF 55%. He underwent staged PCI given the above failed stent to LCx and radiation load in 12/2013 at [facility]. On 12/23/2013 he underwent successful PCI/DES to both mid LCx and D1. There was residual 40% left main stenosis and 50% ostial LCx stenosis. He did have issues with hypokalemia and required repletion at discharge. Hospitalized 05/2015 for chest pain. Cardiac enzymes were negative. Potassium was found to be 2.9. HCTZ was held. Symptoms were atypical. He has recently started dieting, including Weight Watchers. Weight has been stable. He has continued to have issues with his elevated blood pressure which his PCP has been monitoring.  This morning he developed nausea, vomiting, and diarrhea. This was followed by onset of palpitations. He initially felt lightheaded, though that has since improved. He has been under a lot of stress lately with the impending closing of a house and daily funeral services. No chest pain, SOB,  presyncope, or syncope. Weight has been coming down with his planned diet. He does take an OTC potassium supplement for his known long standing hypokalemia.      Past Medical History  Diagnosis Date  . Hypertension   . Hyperlipidemia   . Diabetes mellitus (HCC)     a. poorly controlled  . Depression   . Obesity   . Coronary artery disease     a. NSTEMI; b. 12/23/13 cath 11/2013: D1 80%, LCx 90% failed PCI, dRCA 99%, PL branch 99% s/p PCI/DES,  EF 55%; c. staged cardiac cath 12/2013: s/p PCI/DES to both D1 and mLCx, resdial LM 40%, ostial LCx 50%  . MI (myocardial infarction) (HCC) 11/2013  . Kidney stones   . Hypokalemia     a. recurrent  . Depression   . Tobacco abuse   . Mitral regurgitation     a. echo 11/2013: EF 55-60%, mild LVH, mildly dilated LA, mild TR, mild to mod MR, mildly elevated RVSP      Past Surgical History  Procedure Laterality Date  . Laminectomy    . Hernia repair    . Tonsillectomy    . Cardiac catheterization  12/08/2013  . Coronary angioplasty  12/08/2013    s/p stent placement  . Percutaneous coronary stent intervention (pci-s) N/A 12/23/2013    Procedure: PERCUTANEOUS CORONARY STENT INTERVENTION (PCI-S);  Surgeon: [name], MD;  Location: [facility] CATH LAB;  Service: Cardiovascular;  Laterality: N/A;  . Spine surgery  2014    Current Outpatient Prescriptions  Medication Sig Dispense Refill  . amLODipine (  NORVASC) 10 MG tablet TAKE 1 TABLET (10 MG TOTAL) BY MOUTH DAILY. 90 tablet 3  . aspirin 81 MG tablet Take 81 mg by mouth daily.    Marland Kitchen buPROPion (WELLBUTRIN XL) 150 MG 24 hr tablet Take 1 tablet (150 mg total) by mouth daily. 90 tablet 4  . carvedilol (COREG) 6.25 MG tablet TAKE 1 TABLET (6.25 MG TOTAL) BY MOUTH 2 (TWO) TIMES DAILY WITH A MEAL. 180 tablet 3  . Cholecalciferol (VITAMIN D PO) Take 2 tablets by mouth daily.     Marland Kitchen EFFIENT 10 MG TABS tablet TAKE 1 TABLET (10 MG TOTAL) BY MOUTH DAILY. 90 tablet 3  . ezetimibe (ZETIA) 10 MG tablet Take 1  tablet (10 mg total) by mouth daily. 90 tablet 4  . ibuprofen (ADVIL,MOTRIN) 800 MG tablet Take 1 tablet (800 mg total) by mouth every 8 (eight) hours as needed. 30 tablet 0  . losartan (COZAAR) 100 MG tablet TAKE 1 TABLET (100 MG TOTAL) BY MOUTH DAILY. 90 tablet 3  . magnesium oxide (MAG-OX) 400 MG tablet Take 400 mg by mouth daily.    . metFORMIN (GLUCOPHAGE) 500 MG tablet Take 1 tablet (500 mg total) by mouth daily at 10 pm. 90 tablet 4  . Multiple Vitamin (MULTIVITAMIN) capsule Take 1 capsule by mouth daily.    Marland Kitchen NITROSTAT 0.4 MG SL tablet PLACE 1 TABLET UNDER THE TONGUE EVERY 5 MINUTES AS NEEDED FOR CHEST PAIN 25 tablet 3  . Potassium 99 MG TABS Take 99 mg by mouth daily.    . rosuvastatin (CRESTOR) 20 MG tablet Take 1 tablet (20 mg total) by mouth daily. 90 tablet 4  . vitamin E 400 UNIT capsule Take 400 Units by mouth daily.     No current facility-administered medications for this visit.    Allergies:   Lipitor; Penicillins; and Zocor   Social History:  The patient  reports that he quit smoking about 22 months ago. His smoking use included Cigars. He has never used smokeless tobacco. He reports that he does not drink alcohol or use illicit drugs.   Family History:  The patient's family history includes Heart attack (age of onset: 1) in his father; Heart disease in his father; Hypertension in his mother and sister.  ROS:   Review of Systems  Constitutional: Positive for weight loss and malaise/fatigue. Negative for fever, chills and diaphoresis.  HENT: Negative for congestion.   Eyes: Negative for discharge and redness.  Respiratory: Positive for shortness of breath. Negative for cough, hemoptysis, sputum production and wheezing.   Cardiovascular: Positive for palpitations. Negative for chest pain, orthopnea, claudication, leg swelling and PND.  Gastrointestinal: Positive for nausea, vomiting and diarrhea. Negative for heartburn.  Musculoskeletal: Negative for myalgias, back pain,  joint pain, falls and neck pain.  Skin: Negative for rash.  Neurological: Positive for dizziness and weakness. Negative for tingling, tremors, sensory change, speech change, focal weakness, seizures and loss of consciousness.  Endo/Heme/Allergies: Does not bruise/bleed easily.  Psychiatric/Behavioral: Negative for substance abuse. The patient is nervous/anxious.   All other systems reviewed and are negative.     PHYSICAL EXAM:  VS:  BP 154/98 mmHg  Pulse 68  Ht  (1.676 m)  Wt 180 lb 8 oz (81.874 kg)  BMI 29.15 kg/m2 BMI: Body mass index is 29.15 kg/(m^2). Well nourished, well developed, in no acute distress HEENT: normocephalic, atraumatic Neck: no JVD, carotid bruits or masses Cardiac: irregular, S1, S2; RRR; no murmurs, rubs, or gallops Lungs:  clear  to auscultation bilaterally, no wheezing, rhonchi or rales Abd: soft, nontender, no hepatomegaly, + BS MS: no deformity or atrophy Ext: no edema Skin: warm and dry, no rash Neuro:  moves all extremities spontaneously, no focal abnormalities noted, follows commands Psych: euthymic mood, full affect   EKG:  Was ordered today. Shows NSR with sinus arrhythmia, 68 bpm, occasional PVCs, no significant st/t changes   Recent Labs: 05/26/2015: Hemoglobin 13.7 10/13/2015: ALT 38; Platelets 252; TSH 2.240 10/28/2015: BUN 18; Creatinine, Ser 0.91; Magnesium 2.2; Potassium 3.4*; Sodium 140  06/03/2015: Chol/HDL Ratio 3.2 10/13/2015: Cholesterol, Total 104; HDL 40; LDL Calculated 46; Triglycerides 92   Estimated Creatinine Clearance: 82.4 mL/min (by C-G formula based on Cr of 0.91).   Wt Readings from Last 3 Encounters:  10/28/15 180 lb 8 oz (81.874 kg)  10/13/15 183 lb (83.008 kg)  07/08/15 183 lb (83.008 kg)     Other studies reviewed: Additional studies/records reviewed today include: summarized above  ASSESSMENT AND PLAN:  1. Palpitations: developed this morning with associated diarrhea and lightheadedness. He has a history of  hypokalemia on OTC  supplement. Occasional PVCs on ECG. Check stat bmet and Mg++ this afternoon. Replete lytes as needed. Would have patient  discontinue OTC potassium and start Rx potassium once labs are back. Check echo to evaluation LV systolic function and wall  motion. If abnormal pursue further ischemic work up. 48 hour Holter to quantify PVCs, treat as needed.  2. CAD s/p PCI as above: As above. Continue aspirin, Coreg, Cozaar, SL NTG prn, Crestor, and Zetia  3. History of hypokalemia: As above.   4. Mitral regurgitation: Check echo as above.   5. HTN: Likely mildly elevated today given his increased stresses with palpitations. He is also closing on a house and has to speak at a  funeral.   6. HLD: Crestor as above.  7. Poorly controlled DM: Per patient this improving. Per PCP.   8. Obesity: Weight loss advised.   9. Tobacco abuse: No longer smoking.   Disposition: F/u with Dr. Mariah Milling s/p above work up, sooner if needed   Current medicines are reviewed at length with the patient today.  The patient did not have any concerns regarding medicines.  Elinor Dodge PA-C 10/28/2015 4:56 PM     [facility] [address] [place], [place] [address] [phone]

## 2015-10-28 NOTE — Telephone Encounter (Signed)
Pt calling back stating his wife told him his heart beat is skipping beats and he is getting lightheaded.  Would like to know what he needs to do.

## 2015-10-28 NOTE — Telephone Encounter (Signed)
Called patient had opening today, he is aware and will come in today

## 2015-10-28 NOTE — Patient Instructions (Addendum)
Medication Instructions:  Please continue your current medications  Labwork: STAT BMET, Magnesium Please take orders to the [facility]   Testing/Procedures: Your physician has recommended that you wear a 48 hour holter monitor. Holter monitors are medical devices that record the heart's electrical activity. Doctors most often use these monitors to diagnose arrhythmias. Arrhythmias are problems with the speed or rhythm of the heartbeat. The monitor is a small, portable device. You can wear one while you do your normal daily activities. This is usually used to diagnose what is causing palpitations/syncope (passing out).  Date & time for hook-up: ______________________  Your physician has requested that you have an echocardiogram. Echocardiography is a painless test that uses sound waves to create images of your heart. It provides your doctor with information about the size and shape of your heart and how well your heart's chambers and valves are working. This procedure takes approximately one hour. There are no restrictions for this procedure.  Date & time: __________________________  Follow-Up: 2 week  Date & time: __________________________  If you need a refill on your cardiac medications before your next appointment, please call your pharmacy.  Holter Monitoring A Holter monitor is a small device that is used to detect abnormal heart rhythms. It clips to your clothing and is connected by wires to flat, sticky disks (electrodes) that attach to your chest. It is worn continuously for 24-48 hours. HOME CARE INSTRUCTIONS  Wear your Holter monitor at all times, even while exercising and sleeping, for as long as directed by your health care provider.  Make sure that the Holter monitor is safely clipped to your clothing or close to your body as recommended by your health care provider.  Do not get the monitor or wires wet.  Do not put body lotion or moisturizer on your chest.  Keep  your skin clean.  Keep a diary of your daily activities, such as walking and doing chores. If you feel that your heartbeat is abnormal or that your heart is fluttering or skipping a beat:  Record what you are doing when it happens.  Record what time of day the symptoms occur.  Return your Holter monitor as directed by your health care provider.  Keep all follow-up visits as directed by your health care provider. This is important. SEEK IMMEDIATE MEDICAL CARE IF:  You feel lightheaded or you faint.  You have trouble breathing.  You feel pain in your chest, upper arm, or jaw.  You feel sick to your stomach and your skin is pale, cool, or damp.  You heartbeat feels unusual or abnormal.   This information is not intended to replace advice given to you by your health care provider. Make sure you discuss any questions you have with your health care provider.   Document Released: 06/01/2004 Document Revised: 09/24/2014 Document Reviewed: 04/12/2014 Elsevier Interactive Patient Education 2016 [employer]. Echocardiogram An echocardiogram, or echocardiography, uses sound waves (ultrasound) to produce an image of your heart. The echocardiogram is simple, painless, obtained within a short period of time, and offers valuable information to your health care provider. The images from an echocardiogram can provide information such as:  Evidence of coronary artery disease (CAD).  Heart size.  Heart muscle function.  Heart valve function.  Aneurysm detection.  Evidence of a past heart attack.  Fluid buildup around the heart.  Heart muscle thickening.  Assess heart valve function. LET [facility] CARE PROVIDER KNOW ABOUT:  Any allergies you have.  All medicines  you are taking, including vitamins, herbs, eye drops, creams, and over-the-counter medicines.  Previous problems you or members of your family have had with the use of anesthetics.  Any blood disorders you  have.  Previous surgeries you have had.  Medical conditions you have.  Possibility of pregnancy, if this applies. BEFORE THE PROCEDURE  No special preparation is needed. Eat and drink normally.  PROCEDURE   In order to produce an image of your heart, gel will be applied to your chest and a wand-like tool (transducer) will be moved over your chest. The gel will help transmit the sound waves from the transducer. The sound waves will harmlessly bounce off your heart to allow the heart images to be captured in real-time motion. These images will then be recorded.  You may need an IV to receive a medicine that improves the quality of the pictures. AFTER THE PROCEDURE You may return to your normal schedule including diet, activities, and medicines, unless your health care provider tells you otherwise.   This information is not intended to replace advice given to you by your health care provider. Make sure you discuss any questions you have with your health care provider.   Document Released: 08/31/2000 Document Revised: 09/24/2014 Document Reviewed: 05/11/2013 Elsevier Interactive Patient Education [employer]

## 2015-10-31 ENCOUNTER — Encounter: Payer: Self-pay | Admitting: Physician Assistant

## 2015-11-02 ENCOUNTER — Ambulatory Visit (INDEPENDENT_AMBULATORY_CARE_PROVIDER_SITE_OTHER): Payer: BLUE CROSS/BLUE SHIELD

## 2015-11-02 ENCOUNTER — Other Ambulatory Visit: Payer: Self-pay

## 2015-11-02 ENCOUNTER — Encounter (INDEPENDENT_AMBULATORY_CARE_PROVIDER_SITE_OTHER): Payer: BLUE CROSS/BLUE SHIELD

## 2015-11-02 DIAGNOSIS — R002 Palpitations: Secondary | ICD-10-CM

## 2015-11-02 DIAGNOSIS — E785 Hyperlipidemia, unspecified: Secondary | ICD-10-CM

## 2015-11-02 DIAGNOSIS — E669 Obesity, unspecified: Secondary | ICD-10-CM

## 2015-11-02 DIAGNOSIS — E876 Hypokalemia: Secondary | ICD-10-CM

## 2015-11-02 DIAGNOSIS — Z720 Tobacco use: Secondary | ICD-10-CM

## 2015-11-02 DIAGNOSIS — I10 Essential (primary) hypertension: Secondary | ICD-10-CM

## 2015-11-02 DIAGNOSIS — I2510 Atherosclerotic heart disease of native coronary artery without angina pectoris: Secondary | ICD-10-CM | POA: Diagnosis not present

## 2015-11-02 DIAGNOSIS — I340 Nonrheumatic mitral (valve) insufficiency: Secondary | ICD-10-CM | POA: Diagnosis not present

## 2015-11-08 ENCOUNTER — Other Ambulatory Visit: Payer: Self-pay | Admitting: Family Medicine

## 2015-11-14 ENCOUNTER — Other Ambulatory Visit: Payer: Self-pay | Admitting: Cardiovascular Disease

## 2015-11-14 ENCOUNTER — Other Ambulatory Visit: Payer: Self-pay | Admitting: Respiratory Therapy

## 2015-11-14 ENCOUNTER — Ambulatory Visit (HOSPITAL_BASED_OUTPATIENT_CLINIC_OR_DEPARTMENT_OTHER)
Admission: RE | Admit: 2015-11-14 | Discharge: 2015-11-14 | Disposition: A | Discharge status: Home / Self Care | Payer: BLUE CROSS/BLUE SHIELD | Source: Ambulatory Visit | Attending: Cardiovascular Disease | Admitting: Cardiovascular Disease

## 2015-11-14 ENCOUNTER — Ambulatory Visit
Admission: RE | Admit: 2015-11-14 | Discharge: 2015-11-14 | Disposition: A | Discharge status: Home / Self Care | Payer: BLUE CROSS/BLUE SHIELD | Source: Ambulatory Visit | Attending: Cardiovascular Disease | Admitting: Cardiovascular Disease

## 2015-11-14 DIAGNOSIS — R002 Palpitations: Secondary | ICD-10-CM

## 2015-11-16 ENCOUNTER — Telehealth: Payer: Self-pay | Admitting: Cardiovascular Disease

## 2015-11-16 NOTE — Telephone Encounter (Signed)
Spoke w/ pt.  He reports that he is scheduled for a new pt appt w/ a dentist tomorrow, unsure what will be done. Advised him that he does not need to hold Effient if he is not having scheduled surgery or extraction. As he does not have prosthetic valve, congenital defects, or h/o IE, he does not need SBE prophylaxis.  He is appreciative and will call back w/ any further questions or concerns.

## 2015-11-16 NOTE — Telephone Encounter (Signed)
Patient has new patient dentist appt tomorrow and has questions .  He wants to know if there a special instructions b/c he has a stent now.  Please call patient today.

## 2015-11-23 ENCOUNTER — Other Ambulatory Visit: Payer: Self-pay

## 2015-11-23 MED ORDER — POTASSIUM CHLORIDE CRYS ER 20 MEQ PO TBCR: 20.0000 meq | EXTENDED_RELEASE_TABLET | Freq: Every day | ORAL | Status: DC

## 2015-11-23 NOTE — Telephone Encounter (Signed)
Refill sent for Potassium 20 meq

## 2015-11-25 ENCOUNTER — Encounter: Admission: RE | Payer: Self-pay | Source: Ambulatory Visit

## 2015-11-25 ENCOUNTER — Ambulatory Visit
Admission: RE | Admit: 2015-11-25 | Payer: BLUE CROSS/BLUE SHIELD | Source: Ambulatory Visit | Admitting: Gastroenterology

## 2015-11-25 SURGERY — COLONOSCOPY WITH PROPOFOL
Anesthesia: Choice

## 2015-12-05 ENCOUNTER — Ambulatory Visit (INDEPENDENT_AMBULATORY_CARE_PROVIDER_SITE_OTHER): Payer: BLUE CROSS/BLUE SHIELD | Admitting: Cardiovascular Disease

## 2015-12-05 ENCOUNTER — Encounter: Payer: Self-pay | Admitting: Cardiovascular Disease

## 2015-12-05 VITALS — BP 142/88 | HR 51 | Ht 66.0 in | Wt 175.0 lb

## 2015-12-05 DIAGNOSIS — I25111 Atherosclerotic heart disease of native coronary artery with angina pectoris with documented spasm: Secondary | ICD-10-CM

## 2015-12-05 DIAGNOSIS — I10 Essential (primary) hypertension: Secondary | ICD-10-CM

## 2015-12-05 DIAGNOSIS — E1159 Type 2 diabetes mellitus with other circulatory complications: Secondary | ICD-10-CM

## 2015-12-05 DIAGNOSIS — E785 Hyperlipidemia, unspecified: Secondary | ICD-10-CM | POA: Diagnosis not present

## 2015-12-05 DIAGNOSIS — E1165 Type 2 diabetes mellitus with hyperglycemia: Secondary | ICD-10-CM

## 2015-12-05 NOTE — Assessment & Plan Note (Signed)
Blood pressure is well controlled on today's visit. No changes made to the medications. 

## 2015-12-05 NOTE — Patient Instructions (Addendum)
You are doing well.  Think about holding the zetia,  cholesterol is very low  We can recheck your lipid panel in 6 months  Please call us if you have new issues that need to be addressed before your next appt.  Your physician wants you to follow-up in: 12 months.  You will receive a reminder letter in the mail two months in advance. If you don't receive a letter, please call our office to schedule the follow-up appointment.

## 2015-12-05 NOTE — Assessment & Plan Note (Addendum)
Currently with no symptoms of angina. No further workup at this time. Continue current medication regimen. He has mentioned that he prefers to stay on Effient. Does not want to stop or change to alternate despite the expense

## 2015-12-05 NOTE — Assessment & Plan Note (Signed)
Recommended when his zetia runs out that he not refill this Cholesterol is very low after recent weight loss He is at goal

## 2015-12-05 NOTE — Assessment & Plan Note (Signed)
Encouraged him to continue weight loss, strict diet, he reports much improved sugars

## 2015-12-05 NOTE — Progress Notes (Signed)
Patient ID: Clinton Dorsey, male    DOB: [birth year], 65 y.o.   MRN: [id number]  HPI Comments: Clinton Dorsey is a very pleasant 65 year old gentleman with a history of hyperlipidemia, hypertension, prediabetes, strong family history of coronary artery disease, prior occasional tobacco abuse who presented to [facility] 12/07/2013 with chest pain. He ruled in for non-ST elevation MI with CK-MB of 17.7, CK of 656. He presents today for follow-up of his coronary artery disease  In follow-up, he reports that he is doing well, denies any significant symptoms concerning for angina Reports he is retiring June 2017 Has not had a weekend off and 35 years Reports his palpitations have significantly improved  He had recent Holter and stress test, results reviewed with him in detail Holter did show APCs, PVCs, idioventricular escape complexes, poor short run of SVT  Myoview stress test showing no ischemia  He has lost significant weight by watching his diet, doing daily exercise, weight down 12 pounds from prior clinic visit Previous cholesterol with LDL in the 40s, on Crestor 20 g daily Hemoglobin A1c work acutely improved  Other past medical history Cardiac catheterization was performed that showed severe three-vessel CAD. He had stent for distal RCA/ostial PL branch disease estimated at 99%. He also had mid circumflex with 99% disease, severe diagonal #1 disease. Attempt to place a stent to the mid circumflex was unsuccessful and given that he had significant dye and radiation exposure, it was recommended that he have PCI of his left circumflex and diagonal vessel at a later date. Followup catheterization at [facility] with successful stents to his mid circumflex and first diagonal vessel   Retiring  In June 2017 Stress at work Better palpitations    echocardiogram in the hospital showed ejection fraction 50-55%, mildly dilated left atrium, mild TR, low to moderate MR, no focal wall motion  abnormalities noted    Allergies  Allergen Reactions  . Lipitor [Atorvastatin]     Myalgias   . Penicillins     Rash   . Zocor [Simvastatin]     Myalgias     Outpatient Encounter Prescriptions as of 65/20/2017  Medication Sig  . amLODipine (NORVASC) 10 MG tablet TAKE 1 TABLET (10 MG TOTAL) BY MOUTH DAILY.  Marland Kitchen aspirin 81 MG tablet Take 81 mg by mouth daily.  Marland Kitchen buPROPion (WELLBUTRIN XL) 150 MG 24 hr tablet TAKE 1 TABLET BY MOUTH EVERY DAY  . carvedilol (COREG) 6.25 MG tablet TAKE 1 TABLET (6.25 MG TOTAL) BY MOUTH 2 (TWO) TIMES DAILY WITH A MEAL.  Marland Kitchen Cholecalciferol (VITAMIN D PO) Take 2 tablets by mouth daily.   Marland Kitchen EFFIENT 10 MG TABS tablet TAKE 1 TABLET (10 MG TOTAL) BY MOUTH DAILY.  Marland Kitchen ezetimibe (ZETIA) 10 MG tablet Take 1 tablet (10 mg total) by mouth daily.  Marland Kitchen ibuprofen (ADVIL,MOTRIN) 800 MG tablet Take 1 tablet (800 mg total) by mouth every 8 (eight) hours as needed.  Marland Kitchen losartan (COZAAR) 100 MG tablet TAKE 1 TABLET (100 MG TOTAL) BY MOUTH DAILY.  . magnesium oxide (MAG-OX) 400 MG tablet Take 400 mg by mouth daily.  . metFORMIN (GLUCOPHAGE) 500 MG tablet Take 1 tablet (500 mg total) by mouth daily at 10 pm.  . Multiple Vitamin (MULTIVITAMIN) capsule Take 1 capsule by mouth daily.  Marland Kitchen NITROSTAT 0.4 MG SL tablet PLACE 1 TABLET UNDER THE TONGUE EVERY 5 MINUTES AS NEEDED FOR CHEST PAIN  . Potassium 99 MG TABS Take 99 mg by mouth daily.  . potassium chloride  SA (K-DUR,KLOR-CON) 20 MEQ tablet Take 1 tablet (20 mEq total) by mouth daily.  . rosuvastatin (CRESTOR) 20 MG tablet Take 1 tablet (20 mg total) by mouth daily.  . vitamin E 400 UNIT capsule Take 400 Units by mouth daily.   No facility-administered encounter medications on file as of 12/05/2015.    Past Medical History  Diagnosis Date  . Hypertension   . Hyperlipidemia   . Diabetes mellitus (HCC)     a. poorly controlled  . Depression   . Obesity   . Coronary artery disease     a. NSTEMI; b. 12/23/13 cath 11/2013: D1 80%, LCx  90% failed PCI, dRCA 99%, PL branch 99% s/p PCI/DES,  EF 55%; c. staged cardiac cath 12/2013: s/p PCI/DES to both D1 and mLCx, resdial LM 40%, ostial LCx 50%  . MI (myocardial infarction) (HCC) 11/2013  . Kidney stones   . Hypokalemia     a. recurrent  . Depression   . Tobacco abuse   . Mitral regurgitation     a. echo 11/2013: EF 55-60%, mild LVH, mildly dilated LA, mild TR, mild to mod MR, mildly elevated RVSP      Past Surgical History  Procedure Laterality Date  . Laminectomy    . Hernia repair    . Tonsillectomy    . Cardiac catheterization  12/08/2013  . Coronary angioplasty  12/08/2013    s/p stent placement  . Percutaneous coronary stent intervention (pci-s) N/A 12/23/2013    Procedure: PERCUTANEOUS CORONARY STENT INTERVENTION (PCI-S);  Surgeon: [name], MD;  Location: [facility] CATH LAB;  Service: Cardiovascular;  Laterality: N/A;  . Spine surgery  2014    Social History  reports that he quit smoking about 1 years ago. His smoking use included Cigars. He has never used smokeless tobacco. He reports that he does not drink alcohol or use illicit drugs.  Family History family history includes Heart attack (age of onset: 25) in his father; Heart disease in his father; Hypertension in his mother and sister.       Review of Systems  Constitutional: Negative.   Respiratory: Negative.   Cardiovascular: Negative.   Gastrointestinal: Negative.   Musculoskeletal: Negative.   Skin: Negative.   Neurological: Negative.   Hematological: Negative.   Psychiatric/Behavioral: Negative.   All other systems reviewed and are negative.   BP 142/88 mmHg  Pulse 51  Ht  (1.676 m)  Wt 175 lb (79.379 kg)  BMI 28.26 kg/m2  Physical Exam  Constitutional: He is oriented to person, place, and time. He appears well-developed and well-nourished.  HENT:  Head: Normocephalic.  Nose: Nose normal.  Mouth/Throat: Oropharynx is clear and moist.  Eyes: Conjunctivae are normal. Pupils are  equal, round, and reactive to light.  Neck: Normal range of motion. Neck supple. No JVD present.  Cardiovascular: Normal rate, regular rhythm, S1 normal, S2 normal, normal heart sounds and intact distal pulses.  Exam reveals no gallop and no friction rub.   No murmur heard. Pulmonary/Chest: Effort normal and breath sounds normal. No respiratory distress. He has no wheezes. He has no rales. He exhibits no tenderness.  Abdominal: Soft. Bowel sounds are normal. He exhibits no distension. There is no tenderness.  Musculoskeletal: Normal range of motion. He exhibits no edema or tenderness.  Lymphadenopathy:    He has no cervical adenopathy.  Neurological: He is alert and oriented to person, place, and time. Coordination normal.  Skin: Skin is warm and dry. No rash noted. No  erythema.  Psychiatric: He has a normal mood and affect. His behavior is normal. Judgment and thought content normal.      Assessment and Plan   Nursing note and vitals reviewed.

## 2015-12-15 ENCOUNTER — Encounter: Payer: Self-pay | Admitting: Family Medicine

## 2015-12-15 ENCOUNTER — Ambulatory Visit (INDEPENDENT_AMBULATORY_CARE_PROVIDER_SITE_OTHER): Payer: BLUE CROSS/BLUE SHIELD | Admitting: Family Medicine

## 2015-12-15 VITALS — BP 170/83 | HR 69 | Temp 98.8°F | Ht 65.6 in | Wt 175.0 lb

## 2015-12-15 DIAGNOSIS — I25111 Atherosclerotic heart disease of native coronary artery with angina pectoris with documented spasm: Secondary | ICD-10-CM

## 2015-12-15 DIAGNOSIS — E1165 Type 2 diabetes mellitus with hyperglycemia: Secondary | ICD-10-CM | POA: Diagnosis not present

## 2015-12-15 DIAGNOSIS — F419 Anxiety disorder, unspecified: Secondary | ICD-10-CM

## 2015-12-15 DIAGNOSIS — I10 Essential (primary) hypertension: Secondary | ICD-10-CM | POA: Diagnosis not present

## 2015-12-15 DIAGNOSIS — E1159 Type 2 diabetes mellitus with other circulatory complications: Secondary | ICD-10-CM | POA: Diagnosis not present

## 2015-12-15 MED ORDER — LORAZEPAM 1 MG PO TABS: 1.0000 mg | ORAL_TABLET | Freq: Every day | ORAL | Status: DC | PRN

## 2015-12-15 NOTE — Assessment & Plan Note (Signed)
Poor control of blood pressure mostly stress related will treat stress anxiety 2 affect blood pressure

## 2015-12-15 NOTE — Progress Notes (Signed)
BP 170/83 mmHg  Pulse 69  Temp(Src) 98.8 F (37.1 C)  Ht 5' 5.6" (1.666 m)  Wt 175 lb (79.379 kg)  BMI 28.60 kg/m2   Subjective:    Patient ID: Clinton Dorsey, male    DOB: 1951-08-11, 65 y.o.   MRN: [id number]  HPI: Clinton Dorsey is a 65 y.o. male  Chief Complaint  Patient presents with  . Hypertension   Patient recheck blood pressure been under a great deal of stress with the cell of his house which has gone through and was supposed to 5 days ago blood pressure otherwise has been doing well except for during high stress days Good report from cardiology with no chest pain chest tightness  Relevant past medical, surgical, family and social history reviewed and updated as indicated. Interim medical history since our last visit reviewed. Allergies and medications reviewed and updated.  Review of Systems  Constitutional: Negative.   Respiratory: Negative.   Cardiovascular: Negative.     Per HPI unless specifically indicated above     Objective:    BP 170/83 mmHg  Pulse 69  Temp(Src) 98.8 F (37.1 C)  Ht 5' 5.6" (1.666 m)  Wt 175 lb (79.379 kg)  BMI 28.60 kg/m2  Wt Readings from Last 3 Encounters:  12/15/15 175 lb (79.379 kg)  12/05/15 175 lb (79.379 kg)  10/28/15 180 lb 8 oz (81.874 kg)    Physical Exam  Constitutional: He is oriented to person, place, and time. He appears well-developed and well-nourished. No distress.  HENT:  Head: Normocephalic and atraumatic.  Right Ear: Hearing normal.  Left Ear: Hearing normal.  Nose: Nose normal.  Eyes: Conjunctivae and lids are normal. Right eye exhibits no discharge. Left eye exhibits no discharge. No scleral icterus.  Cardiovascular: Normal rate, regular rhythm and normal heart sounds.   Pulmonary/Chest: Effort normal and breath sounds normal. No respiratory distress.  Musculoskeletal: Normal range of motion.  Neurological: He is alert and oriented to person, place, and time.  Skin: Skin is intact. No rash  noted.  Psychiatric: He has a normal mood and affect. His speech is normal and behavior is normal. Judgment and thought content normal. Cognition and memory are normal.    Results for orders placed or performed during the hospital encounter of 10/28/15  Magnesium  Result Value Ref Range   Magnesium 2.2 1.7 - 2.4 mg/dL  Basic metabolic panel  Result Value Ref Range   Sodium 140 135 - 145 mmol/L   Potassium 3.4 (L) 3.5 - 5.1 mmol/L   Chloride 99 (L) 101 - 111 mmol/L   CO2 35 (H) 22 - 32 mmol/L   Glucose, Bld 123 (H) 65 - 99 mg/dL   BUN 18 6 - 20 mg/dL   Creatinine, Ser 4.09 0.61 - 1.24 mg/dL   Calcium 9.8 8.9 - 81.1 mg/dL   GFR calc non Af Amer >60 >60 mL/min   GFR calc Af Amer >60 >60 mL/min   Anion gap 6 5 - 15      Assessment & Plan:   Problem List Items Addressed This Visit      Cardiovascular and Mediastinum   Essential hypertension    Poor control of blood pressure mostly stress related will treat stress anxiety 2 affect blood pressure      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

## 2015-12-15 NOTE — Assessment & Plan Note (Signed)
Reviewed notes patient stable with good blood pressure

## 2015-12-15 NOTE — Assessment & Plan Note (Signed)
Looks like largely controlled with diet and exercise

## 2015-12-15 NOTE — Assessment & Plan Note (Signed)
Discuss anxiety and blood pressure cautions about use of lorazepam habit-forming will use only occasional use

## 2016-01-14 ENCOUNTER — Other Ambulatory Visit: Payer: Self-pay | Admitting: Cardiovascular Disease

## 2016-01-17 ENCOUNTER — Other Ambulatory Visit: Payer: Self-pay | Admitting: Cardiovascular Disease

## 2016-02-10 ENCOUNTER — Other Ambulatory Visit: Payer: Self-pay | Admitting: Cardiovascular Disease

## 2016-02-20 DIAGNOSIS — H43393 Other vitreous opacities, bilateral: Secondary | ICD-10-CM | POA: Diagnosis not present

## 2016-02-20 DIAGNOSIS — H2513 Age-related nuclear cataract, bilateral: Secondary | ICD-10-CM | POA: Diagnosis not present

## 2016-02-20 DIAGNOSIS — E119 Type 2 diabetes mellitus without complications: Secondary | ICD-10-CM | POA: Diagnosis not present

## 2016-02-20 DIAGNOSIS — E089 Diabetes mellitus due to underlying condition without complications: Secondary | ICD-10-CM | POA: Diagnosis not present

## 2016-02-20 LAB — HM DIABETES EYE EXAM

## 2016-04-10 ENCOUNTER — Other Ambulatory Visit: Payer: Self-pay

## 2016-04-10 ENCOUNTER — Other Ambulatory Visit: Payer: Self-pay | Admitting: Family Medicine

## 2016-04-10 DIAGNOSIS — I2510 Atherosclerotic heart disease of native coronary artery without angina pectoris: Secondary | ICD-10-CM

## 2016-04-10 DIAGNOSIS — I2583 Coronary atherosclerosis due to lipid rich plaque: Principal | ICD-10-CM

## 2016-04-10 DIAGNOSIS — E119 Type 2 diabetes mellitus without complications: Secondary | ICD-10-CM

## 2016-04-10 MED ORDER — LOSARTAN POTASSIUM 100 MG PO TABS: ORAL_TABLET | ORAL | 3 refills | Status: DC

## 2016-04-10 MED ORDER — PRASUGREL HCL 10 MG PO TABS: ORAL_TABLET | ORAL | 3 refills | Status: DC

## 2016-04-10 MED ORDER — POTASSIUM CHLORIDE CRYS ER 20 MEQ PO TBCR: 20.0000 meq | EXTENDED_RELEASE_TABLET | Freq: Every day | ORAL | 3 refills | Status: DC

## 2016-04-10 MED ORDER — AMLODIPINE BESYLATE 10 MG PO TABS: ORAL_TABLET | ORAL | 3 refills | Status: DC

## 2016-04-10 MED ORDER — EZETIMIBE 10 MG PO TABS: 10.0000 mg | ORAL_TABLET | Freq: Every day | ORAL | 3 refills | Status: DC

## 2016-04-10 MED ORDER — METFORMIN HCL 500 MG PO TABS: 500.0000 mg | ORAL_TABLET | Freq: Every day | ORAL | 4 refills | Status: DC

## 2016-04-10 MED ORDER — CARVEDILOL 6.25 MG PO TABS: ORAL_TABLET | ORAL | 3 refills | Status: DC

## 2016-04-10 MED ORDER — ROSUVASTATIN CALCIUM 10 MG PO TABS: 10.0000 mg | ORAL_TABLET | Freq: Every day | ORAL | 3 refills | Status: DC

## 2016-04-10 MED ORDER — ROSUVASTATIN CALCIUM 20 MG PO TABS: 20.0000 mg | ORAL_TABLET | Freq: Every day | ORAL | 3 refills | Status: DC

## 2016-04-10 MED ORDER — BUPROPION HCL ER (XL) 150 MG PO TB24: 150.0000 mg | ORAL_TABLET | Freq: Every day | ORAL | 3 refills | Status: DC

## 2016-04-10 NOTE — Telephone Encounter (Signed)
Pt states he also takes Zetia

## 2016-04-13 ENCOUNTER — Telehealth: Payer: Self-pay | Admitting: Cardiovascular Disease

## 2016-04-13 DIAGNOSIS — I2510 Atherosclerotic heart disease of native coronary artery without angina pectoris: Secondary | ICD-10-CM

## 2016-04-13 DIAGNOSIS — I2583 Coronary atherosclerosis due to lipid rich plaque: Principal | ICD-10-CM

## 2016-04-13 NOTE — Telephone Encounter (Signed)
[facility] calling stating they received refills for patient but patient is not one of their's  They just wanted to let us know they will not be filling them Please advise.   They are located in [place] and patient lives in [place]

## 2016-04-16 ENCOUNTER — Other Ambulatory Visit: Payer: Self-pay | Admitting: Family Medicine

## 2016-04-16 MED ORDER — POTASSIUM CHLORIDE CRYS ER 20 MEQ PO TBCR: 20.0000 meq | EXTENDED_RELEASE_TABLET | Freq: Every day | ORAL | 3 refills | Status: DC

## 2016-04-16 MED ORDER — BUPROPION HCL ER (XL) 150 MG PO TB24: 150.0000 mg | ORAL_TABLET | Freq: Every day | ORAL | 3 refills | Status: DC

## 2016-04-17 MED ORDER — LOSARTAN POTASSIUM 100 MG PO TABS: ORAL_TABLET | ORAL | 3 refills | Status: DC

## 2016-04-17 MED ORDER — EZETIMIBE 10 MG PO TABS: 10.0000 mg | ORAL_TABLET | Freq: Every day | ORAL | 3 refills | Status: DC

## 2016-04-17 MED ORDER — AMLODIPINE BESYLATE 10 MG PO TABS: ORAL_TABLET | ORAL | 3 refills | Status: DC

## 2016-04-17 MED ORDER — CARVEDILOL 6.25 MG PO TABS: ORAL_TABLET | ORAL | 3 refills | Status: DC

## 2016-04-17 MED ORDER — PRASUGREL HCL 10 MG PO TABS: ORAL_TABLET | ORAL | 3 refills | Status: DC

## 2016-04-17 MED ORDER — ROSUVASTATIN CALCIUM 10 MG PO TABS: 10.0000 mg | ORAL_TABLET | Freq: Every day | ORAL | 3 refills | Status: DC

## 2016-04-18 ENCOUNTER — Ambulatory Visit: Payer: Self-pay | Admitting: Family Medicine

## 2016-05-22 ENCOUNTER — Telehealth: Payer: Self-pay | Admitting: Cardiovascular Disease

## 2016-05-22 NOTE — Telephone Encounter (Signed)
Left message for pt to call back.  What type of procedure is he having and when?

## 2016-05-22 NOTE — Telephone Encounter (Signed)
Patient confirmed this is for a tooth extraction.

## 2016-05-22 NOTE — Telephone Encounter (Signed)
Pt states he has a dental procedure and asks if he should stop his Effient, and if he should have an antibiotic. Please call and advise.

## 2016-05-23 NOTE — Telephone Encounter (Signed)
Pt calling a bit upset stating he is now at the dentist and is asking if he is allowed to get tooth extracted

## 2016-05-24 NOTE — Telephone Encounter (Signed)
Pt was at dentist office yesterday am to have tooth extracted. I was present at the front desk when he called. Reception advised him that Dr. Mariah Milling had not made recommendation yet. Pt stated that he was going to proceed anyway and hung up.

## 2016-06-05 ENCOUNTER — Other Ambulatory Visit: Payer: BLUE CROSS/BLUE SHIELD

## 2016-06-06 ENCOUNTER — Ambulatory Visit: Payer: Self-pay | Admitting: Family Medicine

## 2016-06-12 ENCOUNTER — Other Ambulatory Visit: Payer: BLUE CROSS/BLUE SHIELD

## 2016-06-19 ENCOUNTER — Ambulatory Visit (INDEPENDENT_AMBULATORY_CARE_PROVIDER_SITE_OTHER): Payer: BLUE CROSS/BLUE SHIELD

## 2016-06-19 ENCOUNTER — Other Ambulatory Visit (INDEPENDENT_AMBULATORY_CARE_PROVIDER_SITE_OTHER): Payer: BLUE CROSS/BLUE SHIELD | Admitting: *Deleted

## 2016-06-19 DIAGNOSIS — Z23 Encounter for immunization: Secondary | ICD-10-CM

## 2016-06-19 DIAGNOSIS — E876 Hypokalemia: Secondary | ICD-10-CM

## 2016-06-20 ENCOUNTER — Telehealth: Payer: Self-pay | Admitting: Cardiovascular Disease

## 2016-06-20 LAB — BASIC METABOLIC PANEL
BUN/Creatinine Ratio: 24 (ref 10–24)
BUN: 23 mg/dL (ref 8–27)
CO2: 26 mmol/L (ref 18–29)
Calcium: 9.6 mg/dL (ref 8.6–10.2)
Chloride: 100 mmol/L (ref 96–106)
Creatinine, Ser: 0.97 mg/dL (ref 0.76–1.27)
GFR calc Af Amer: 94 mL/min/{1.73_m2} (ref >59)
GFR calc non Af Amer: 82 mL/min/{1.73_m2} (ref >59)
Glucose: 167 mg/dL — ABNORMAL HIGH (ref 65–99)
Potassium: 4.6 mmol/L (ref 3.5–5.2)
Sodium: 144 mmol/L (ref 134–144)

## 2016-06-20 NOTE — Telephone Encounter (Signed)
New message ° ° °Pt verbalized that he is calling for lab results  °

## 2016-06-20 NOTE — Telephone Encounter (Signed)
See results note. Reviewed results with patient and he verbalized understanding.

## 2016-07-10 ENCOUNTER — Ambulatory Visit (INDEPENDENT_AMBULATORY_CARE_PROVIDER_SITE_OTHER): Payer: BLUE CROSS/BLUE SHIELD | Admitting: Family Medicine

## 2016-07-10 ENCOUNTER — Encounter: Payer: Self-pay | Admitting: Family Medicine

## 2016-07-10 VITALS — BP 183/118 | HR 90 | Temp 98.1°F | Wt 174.0 lb

## 2016-07-10 DIAGNOSIS — I10 Essential (primary) hypertension: Secondary | ICD-10-CM

## 2016-07-10 DIAGNOSIS — E1159 Type 2 diabetes mellitus with other circulatory complications: Secondary | ICD-10-CM | POA: Diagnosis not present

## 2016-07-10 DIAGNOSIS — N529 Male erectile dysfunction, unspecified: Secondary | ICD-10-CM

## 2016-07-10 DIAGNOSIS — Z23 Encounter for immunization: Secondary | ICD-10-CM

## 2016-07-10 DIAGNOSIS — E1165 Type 2 diabetes mellitus with hyperglycemia: Secondary | ICD-10-CM

## 2016-07-10 DIAGNOSIS — E782 Mixed hyperlipidemia: Secondary | ICD-10-CM | POA: Diagnosis not present

## 2016-07-10 DIAGNOSIS — I2510 Atherosclerotic heart disease of native coronary artery without angina pectoris: Secondary | ICD-10-CM

## 2016-07-10 LAB — LP+ALT+AST PICCOLO, WAIVED
ALT (SGPT) Piccolo, Waived: 40 U/L (ref 10–47)
AST (SGOT) Piccolo, Waived: 28 U/L (ref 11–38)
Chol/HDL Ratio Piccolo,Waive: 2.6 mg/dL
Cholesterol Piccolo, Waived: 164 mg/dL (ref <200)
HDL Chol Piccolo, Waived: 63 mg/dL (ref >59)
LDL Chol Calc Piccolo Waived: 79 mg/dL (ref <100)
Triglycerides Piccolo,Waived: 108 mg/dL (ref <150)
VLDL Chol Calc Piccolo,Waive: 22 mg/dL (ref <30)

## 2016-07-10 LAB — BAYER DCA HB A1C WAIVED: HB A1C (BAYER DCA - WAIVED): 7.1 % — ABNORMAL HIGH (ref <7.0)

## 2016-07-10 MED ORDER — CARVEDILOL 12.5 MG PO TABS: ORAL_TABLET | ORAL | 2 refills | Status: DC

## 2016-07-10 MED ORDER — SILDENAFIL CITRATE 20 MG PO TABS: 20.0000 mg | ORAL_TABLET | Freq: Every day | ORAL | 12 refills | Status: DC | PRN

## 2016-07-10 NOTE — Assessment & Plan Note (Signed)
Trial of Viagra. 

## 2016-07-10 NOTE — Progress Notes (Signed)
BP (!) 183/118   Pulse 90   Temp 98.1 F (36.7 C)   Wt 174 lb (78.9 kg)   SpO2 98%   BMI 28.43 kg/m    Subjective:    Patient ID: Clinton Dorsey, male    DOB: 01-01-1951, 65 y.o.   MRN: [id number]  HPI: Clinton Dorsey is a 65 y.o. male  Chief Complaint  Patient presents with  . Hypertension  . Diabetes  . Hyperlipidemia   Patient follow-up has been doing well with medications No low blood sugar spells has decreased restorative 10 mg Has cut back on some of his exercise Weight is remaining stable Blood pressure is noted to be elevated at dentist office and here. His been taking medications faithfully without side effects Has noticed some ED concerns Left lateral toe where rubs with second toe has some redness is been doing more walking at part-time job. Relevant past medical, surgical, family and social history reviewed and updated as indicated. Interim medical history since our last visit reviewed. Allergies and medications reviewed and updated.  Review of Systems  Constitutional: Negative.   Respiratory: Negative.   Cardiovascular: Negative.     Per HPI unless specifically indicated above     Objective:    BP (!) 183/118   Pulse 90   Temp 98.1 F (36.7 C)   Wt 174 lb (78.9 kg)   SpO2 98%   BMI 28.43 kg/m   Wt Readings from Last 3 Encounters:  07/10/16 174 lb (78.9 kg)  12/15/15 175 lb (79.4 kg)  12/05/15 175 lb (79.4 kg)    Physical Exam  Constitutional: He is oriented to person, place, and time. He appears well-developed and well-nourished. No distress.  HENT:  Head: Normocephalic and atraumatic.  Right Ear: Hearing normal.  Left Ear: Hearing normal.  Nose: Nose normal.  Eyes: Conjunctivae and lids are normal. Right eye exhibits no discharge. Left eye exhibits no discharge. No scleral icterus.  Cardiovascular: Normal rate, regular rhythm and normal heart sounds.   Pulmonary/Chest: Effort normal and breath sounds normal. No respiratory distress.    Musculoskeletal: Normal range of motion.  Lateral toe area slightly inflamed no evidence of infection or ingrowing nail.  Neurological: He is alert and oriented to person, place, and time.  Skin: Skin is intact. No rash noted.  Psychiatric: He has a normal mood and affect. His speech is normal and behavior is normal. Judgment and thought content normal. Cognition and memory are normal.    Results for orders placed or performed in visit on 06/19/16  Basic metabolic panel  Result Value Ref Range   Glucose 167 (H) 65 - 99 mg/dL   BUN 23 8 - 27 mg/dL   Creatinine, Ser 3.080.97 0.76 - 1.27 mg/dL   GFR calc non Af Amer 82 >59 mL/min/1.73   GFR calc Af Amer 94 >59 mL/min/1.73   BUN/Creatinine Ratio 24 10 - 24   Sodium 144 134 - 144 mmol/L   Potassium 4.6 3.5 - 5.2 mmol/L   Chloride 100 96 - 106 mmol/L   CO2 26 18 - 29 mmol/L   Calcium 9.6 8.6 - 10.2 mg/dL      Assessment & Plan:   Problem List Items Addressed This Visit      Cardiovascular and Mediastinum   Coronary atherosclerosis of native coronary artery (Chronic)    The current medical regimen is effective;  continue present plan and medications.       Relevant Medications   carvedilol (COREG) 12.5  MG tablet   sildenafil (REVATIO) 20 MG tablet   Essential hypertension - Primary    Poor control of blood pressure discuss Will continue current medications increasing carvedilol to 12.5 twice a day recheck 1 month blood pressure      Relevant Medications   carvedilol (COREG) 12.5 MG tablet   sildenafil (REVATIO) 20 MG tablet   Other Relevant Orders   Basic metabolic panel   Poorly controlled type 2 diabetes mellitus with circulatory disorder (HCC)    The current medical regimen is effective;  continue present plan and medications. Discuss need for little better control with diabetes will do better with diet neck size.      Relevant Medications   carvedilol (COREG) 12.5 MG tablet   sildenafil (REVATIO) 20 MG tablet   Other  Relevant Orders   Bayer DCA Hb A1c Waived     Genitourinary   Erectile dysfunction    Trial of Viagra       Other Visit Diagnoses    Mixed hyperlipidemia       Relevant Medications   carvedilol (COREG) 12.5 MG tablet   sildenafil (REVATIO) 20 MG tablet   Other Relevant Orders   LP+ALT+AST Piccolo, Waived   Need for pneumococcal vaccination       Relevant Orders   Pneumococcal conjugate vaccine 13-valent IM (Completed)       Follow up plan: Return in about 4 weeks (around 08/07/2016) for Blood pressure check.

## 2016-07-10 NOTE — Assessment & Plan Note (Signed)
The current medical regimen is effective;  continue present plan and medications.  

## 2016-07-10 NOTE — Assessment & Plan Note (Signed)
The current medical regimen is effective;  continue present plan and medications. Discuss need for little better control with diabetes will do better with diet neck size.

## 2016-07-10 NOTE — Assessment & Plan Note (Signed)
Poor control of blood pressure discuss Will continue current medications increasing carvedilol to 12.5 twice a day recheck 1 month blood pressure

## 2016-07-11 ENCOUNTER — Encounter: Payer: Self-pay | Admitting: Family Medicine

## 2016-07-11 LAB — BASIC METABOLIC PANEL
BUN/Creatinine Ratio: 26 — ABNORMAL HIGH (ref 10–24)
BUN: 25 mg/dL (ref 8–27)
CO2: 28 mmol/L (ref 18–29)
Calcium: 10.1 mg/dL (ref 8.6–10.2)
Chloride: 98 mmol/L (ref 96–106)
Creatinine, Ser: 0.95 mg/dL (ref 0.76–1.27)
GFR calc Af Amer: 97 mL/min/{1.73_m2} (ref >59)
GFR calc non Af Amer: 84 mL/min/{1.73_m2} (ref >59)
Glucose: 109 mg/dL — ABNORMAL HIGH (ref 65–99)
Potassium: 3.5 mmol/L (ref 3.5–5.2)
Sodium: 143 mmol/L (ref 134–144)

## 2016-07-25 ENCOUNTER — Telehealth: Payer: Self-pay | Admitting: Family Medicine

## 2016-07-25 MED ORDER — SILDENAFIL CITRATE 20 MG PO TABS: 20.0000 mg | ORAL_TABLET | Freq: Every day | ORAL | 12 refills | Status: DC | PRN

## 2016-07-25 NOTE — Telephone Encounter (Signed)
rx changed and sent

## 2016-07-25 NOTE — Telephone Encounter (Signed)
Clinton Dorsey called and would like clarification on the dosage for sildenafil (REVATIO) 20 MG tablet.

## 2016-07-25 NOTE — Telephone Encounter (Signed)
Dr. Dossie Arbour,  The quantity is for # 50.  Is this the correct quantity? Sig is Take 1 tablet by mouth daily as needed.

## 2016-07-26 ENCOUNTER — Other Ambulatory Visit: Payer: Self-pay | Admitting: Family Medicine

## 2016-07-26 MED ORDER — SILDENAFIL CITRATE 20 MG PO TABS: 20.0000 mg | ORAL_TABLET | Freq: Every day | ORAL | 12 refills | Status: DC | PRN

## 2016-08-07 ENCOUNTER — Ambulatory Visit: Payer: BLUE CROSS/BLUE SHIELD | Admitting: Family Medicine

## 2016-08-13 ENCOUNTER — Encounter: Payer: Self-pay | Admitting: Family Medicine

## 2016-08-13 ENCOUNTER — Ambulatory Visit (INDEPENDENT_AMBULATORY_CARE_PROVIDER_SITE_OTHER): Payer: BLUE CROSS/BLUE SHIELD | Admitting: Family Medicine

## 2016-08-13 DIAGNOSIS — F419 Anxiety disorder, unspecified: Secondary | ICD-10-CM

## 2016-08-13 DIAGNOSIS — I10 Essential (primary) hypertension: Secondary | ICD-10-CM | POA: Diagnosis not present

## 2016-08-13 MED ORDER — LORAZEPAM 1 MG PO TABS: 0.5000 mg | ORAL_TABLET | Freq: Two times a day (BID) | ORAL | 0 refills | Status: DC | PRN

## 2016-08-13 MED ORDER — HYDROCHLOROTHIAZIDE 12.5 MG PO TABS: 12.5000 mg | ORAL_TABLET | Freq: Every day | ORAL | 2 refills | Status: DC

## 2016-08-13 MED ORDER — POTASSIUM CHLORIDE CRYS ER 10 MEQ PO TBCR: 10.0000 meq | EXTENDED_RELEASE_TABLET | Freq: Every day | ORAL | 3 refills | Status: DC

## 2016-08-13 MED ORDER — CARVEDILOL 12.5 MG PO TABS: 12.5000 mg | ORAL_TABLET | Freq: Two times a day (BID) | ORAL | 6 refills | Status: DC

## 2016-08-13 NOTE — Assessment & Plan Note (Signed)
Has travel anxiety specifically getting on an airplane will give lorazepam. Patient traveling with his wife for safety.

## 2016-08-13 NOTE — Progress Notes (Signed)
BP (!) 169/61 (BP Location: Left Arm, Patient Position: Sitting, Cuff Size: Normal)   Pulse 78   Temp 98 F (36.7 C)   Ht 5\' 6"  (1.676 m)   Wt 184 lb 6.4 oz (83.6 kg)   SpO2 98%   BMI 29.76 kg/m    Subjective:    Patient ID: Clinton Dorsey, male    DOB: Jul 30, 1951, 65 y.o.   MRN: [id number]  HPI: Clinton AxonRaymond Persinger is a 65 y.o. male  Chief Complaint  Patient presents with  . Follow-up  . Hypertension   Blood pressure remains up patient's just started on 12.5 twice a day of Coreg in the week. Was concerned about possible side effects. Has also been continuing to work at 5 AM with 5 hours or less of sleep.  Relevant past medical, surgical, family and social history reviewed and updated as indicated. Interim medical history since our last visit reviewed. Allergies and medications reviewed and updated.  Review of Systems  Per HPI unless specifically indicated above     Objective:    BP (!) 169/61 (BP Location: Left Arm, Patient Position: Sitting, Cuff Size: Normal)   Pulse 78   Temp 98 F (36.7 C)   Ht 5\' 6"  (1.676 m)   Wt 184 lb 6.4 oz (83.6 kg)   SpO2 98%   BMI 29.76 kg/m   Wt Readings from Last 3 Encounters:  08/13/16 184 lb 6.4 oz (83.6 kg)  07/10/16 174 lb (78.9 kg)  12/15/15 175 lb (79.4 kg)    Physical Exam  Results for orders placed or performed in visit on 07/10/16  LP+ALT+AST Piccolo, Waived  Result Value Ref Range   ALT (SGPT) Piccolo, Waived 40 10 - 47 U/L   AST (SGOT) Piccolo, Waived 28 11 - 38 U/L   Cholesterol Piccolo, Waived 164 <200 mg/dL   HDL Chol Piccolo, Waived 63 >59 mg/dL   Triglycerides Piccolo,Waived 108 <150 mg/dL   Chol/HDL Ratio Piccolo,Waive 2.6 mg/dL   LDL Chol Calc Piccolo Waived 79 <100 mg/dL   VLDL Chol Calc Piccolo,Waive 22 <30 mg/dL  Bayer DCA Hb N5AA1c Waived  Result Value Ref Range   Bayer DCA Hb A1c Waived 7.1 (H) <7.0 %  Basic metabolic panel  Result Value Ref Range   Glucose 109 (H) 65 - 99 mg/dL   BUN 25 8 - 27 mg/dL    Creatinine, Ser 2.130.95 0.76 - 1.27 mg/dL   GFR calc non Af Amer 84 >59 mL/min/1.73   GFR calc Af Amer 97 >59 mL/min/1.73   BUN/Creatinine Ratio 26 (H) 10 - 24   Sodium 143 134 - 144 mmol/L   Potassium 3.5 3.5 - 5.2 mmol/L   Chloride 98 96 - 106 mmol/L   CO2 28 18 - 29 mmol/L   Calcium 10.1 8.6 - 10.2 mg/dL      Assessment & Plan:   Problem List Items Addressed This Visit      Cardiovascular and Mediastinum   Essential hypertension    Discussed hypertension care and treatment risk factors and risk modification with lifestyle and medication. We will continue Coreg 12.5 twice a day add hydrochlorothiazide 12.5 once a day add potassium 10 mEq because of previous experience with hydrochlorothiazide and hypokalemia.      Relevant Medications   carvedilol (COREG) 12.5 MG tablet   hydrochlorothiazide (HYDRODIURIL) 12.5 MG tablet   potassium chloride SA (K-DUR,KLOR-CON) 10 MEQ tablet     Other   Acute anxiety    Has travel anxiety specifically  getting on an airplane will give lorazepam. Patient traveling with his wife for safety.      Relevant Medications   LORazepam (ATIVAN) 1 MG tablet       Follow up plan: Return in about 4 weeks (around 09/10/2016) for BMP.

## 2016-08-13 NOTE — Assessment & Plan Note (Signed)
Discussed hypertension care and treatment risk factors and risk modification with lifestyle and medication. We will continue Coreg 12.5 twice a day add hydrochlorothiazide 12.5 once a day add potassium 10 mEq because of previous experience with hydrochlorothiazide and hypokalemia.

## 2016-09-05 ENCOUNTER — Ambulatory Visit: Payer: BLUE CROSS/BLUE SHIELD | Admitting: Family Medicine

## 2016-09-24 ENCOUNTER — Ambulatory Visit: Payer: BLUE CROSS/BLUE SHIELD | Admitting: Family Medicine

## 2016-10-01 ENCOUNTER — Telehealth: Payer: Self-pay | Admitting: Cardiovascular Disease

## 2016-10-01 ENCOUNTER — Ambulatory Visit (INDEPENDENT_AMBULATORY_CARE_PROVIDER_SITE_OTHER): Payer: BLUE CROSS/BLUE SHIELD | Admitting: Family Medicine

## 2016-10-01 ENCOUNTER — Encounter: Payer: Self-pay | Admitting: Family Medicine

## 2016-10-01 VITALS — BP 165/96 | HR 66 | Ht 66.0 in | Wt 181.0 lb

## 2016-10-01 DIAGNOSIS — I10 Essential (primary) hypertension: Secondary | ICD-10-CM

## 2016-10-01 DIAGNOSIS — E785 Hyperlipidemia, unspecified: Secondary | ICD-10-CM

## 2016-10-01 DIAGNOSIS — E119 Type 2 diabetes mellitus without complications: Secondary | ICD-10-CM

## 2016-10-01 DIAGNOSIS — E876 Hypokalemia: Secondary | ICD-10-CM | POA: Diagnosis not present

## 2016-10-01 LAB — BAYER DCA HB A1C WAIVED: HB A1C (BAYER DCA - WAIVED): 7.3 % — ABNORMAL HIGH (ref <7.0)

## 2016-10-01 MED ORDER — METFORMIN HCL 500 MG PO TABS: 500.0000 mg | ORAL_TABLET | Freq: Two times a day (BID) | ORAL | 2 refills | Status: DC

## 2016-10-01 MED ORDER — HYDROCHLOROTHIAZIDE 25 MG PO TABS: 25.0000 mg | ORAL_TABLET | Freq: Every day | ORAL | 2 refills | Status: DC

## 2016-10-01 NOTE — Assessment & Plan Note (Signed)
For diabetes will increase metformin to 500 mg twice a day.

## 2016-10-01 NOTE — Assessment & Plan Note (Signed)
For blood pressure are slightly elevated due to latency of taking medications today but also will increase hydrochlorothiazide from 12.5 to 25

## 2016-10-01 NOTE — Telephone Encounter (Signed)
Pt c/o BP issue: STAT if pt c/o blurred vision, one-sided weakness or slurred speech  1. What are your last 5 BP readings?   10/01/16 160/98 HR 65  (Pt will call back with more readings he doesn't take them on a daily bases only took it today)   2. Are you having any other symptoms (ex. Dizziness, headache, blurred vision, passed out)?   States he is just  very tired   3. What is your BP issue?  BP Has been staying a bit high, states Dr Dossie Arbour has upped the dose and added him on some new medications  He upped the dose Carvedilol 12.5 mg once a day and he also added losartan 100 mg once a day and also added Hydrochlorothiazide 12.5 mg once a day    Would like some advise on this Please advise

## 2016-10-01 NOTE — Telephone Encounter (Signed)
Called patient to make appointment  But he states he would like to see Dr Dossie Arbour (has apt today at 345 pm) first only because he just realized he did not take his BP med this morning. And the last time it was this high was when he saw Dr Dossie Arbour last.  So he is not that concerned anymore but will  will call us back after seeing him Will await his call back

## 2016-10-01 NOTE — Telephone Encounter (Signed)
We haven't seen him since March last year. Can you see if he can come in tomorrow to see Dr. Mariah Milling?

## 2016-10-01 NOTE — Assessment & Plan Note (Signed)
With continued poor control of diabetes will increase metformin to 500 twice a day

## 2016-10-01 NOTE — Progress Notes (Addendum)
BP (!) 165/96   Pulse 66   Ht 5\' 6"  (1.676 m)   Wt 181 lb (82.1 kg)   SpO2 97%   BMI 29.21 kg/m    Subjective:    Patient ID: Clinton Dorsey, male    DOB: [birth year], 66 y.o.   MRN: [id number]  HPI: Clinton Dorsey is a 66 y.o. male  Chief Complaint  Patient presents with  . Follow-up  . Hypertension   Concerned about hypertension took medications earlier this afternoon usually takes in the morning. Blood pressures been okay but somewhat elevated. Blood sugars elevated also has been on nice cruise with a lot of food and has gained some weight and not exercising as much because the season and whether.   Relevant past medical, surgical, family and social history reviewed and updated as indicated. Interim medical history since our last visit reviewed. Allergies and medications reviewed and updated.  Review of Systems  Constitutional: Negative.   Respiratory: Negative.   Cardiovascular: Negative.     Per HPI unless specifically indicated above     Objective:    BP (!) 165/96   Pulse 66   Ht 5\' 6"  (1.676 m)   Wt 181 lb (82.1 kg)   SpO2 97%   BMI 29.21 kg/m   Wt Readings from Last 3 Encounters:  10/01/16 181 lb (82.1 kg)  08/13/16 184 lb 6.4 oz (83.6 kg)  07/10/16 174 lb (78.9 kg)    Physical Exam  Constitutional: He is oriented to person, place, and time. He appears well-developed and well-nourished. No distress.  HENT:  Head: Normocephalic and atraumatic.  Right Ear: Hearing normal.  Left Ear: Hearing normal.  Nose: Nose normal.  Eyes: Conjunctivae and lids are normal. Right eye exhibits no discharge. Left eye exhibits no discharge. No scleral icterus.  Cardiovascular: Normal rate, regular rhythm and normal heart sounds.   Pulmonary/Chest: Effort normal and breath sounds normal. No respiratory distress.  Musculoskeletal: Normal range of motion.  Neurological: He is alert and oriented to person, place, and time.  Skin: Skin is intact. No rash noted.    Psychiatric: He has a normal mood and affect. His speech is normal and behavior is normal. Judgment and thought content normal. Cognition and memory are normal.    Results for orders placed or performed in visit on 10/01/16  Bayer DCA Hb A1c Waived (STAT)  Result Value Ref Range   Bayer DCA Hb A1c Waived 7.3 (H) <7.0 %      Assessment & Plan:   Problem List Items Addressed This Visit      Cardiovascular and Mediastinum   Essential hypertension - Primary    For blood pressure are slightly elevated due to latency of taking medications today but also will increase hydrochlorothiazide from 12.5 to 25      Relevant Medications   hydrochlorothiazide (HYDRODIURIL) 25 MG tablet   Other Relevant Orders   Basic metabolic panel   Bayer DCA Hb W0J Waived (STAT) (Completed)     Endocrine   Diabetes mellitus without complication (HCC)    With continued poor control of diabetes will increase metformin to 500 twice a day      Relevant Medications   metFORMIN (GLUCOPHAGE) 500 MG tablet   Other Relevant Orders   Basic metabolic panel   Bayer DCA Hb W1X Waived (STAT) (Completed)     Other   Hypokalemia   Relevant Orders   Basic metabolic panel   Hyperlipidemia   Relevant Medications   hydrochlorothiazide (  HYDRODIURIL) 25 MG tablet   Other Relevant Orders   Basic metabolic panel   Bayer DCA Hb T1Z Waived (STAT) (Completed)       Follow up plan: Return in about 3 months (around 12/30/2016) for Hemoglobin A1c, BMP.

## 2016-10-02 ENCOUNTER — Telehealth: Payer: Self-pay | Admitting: Family Medicine

## 2016-10-02 ENCOUNTER — Encounter: Payer: Self-pay | Admitting: Family Medicine

## 2016-10-02 ENCOUNTER — Other Ambulatory Visit: Payer: Self-pay | Admitting: Family Medicine

## 2016-10-02 DIAGNOSIS — I10 Essential (primary) hypertension: Secondary | ICD-10-CM

## 2016-10-02 DIAGNOSIS — E876 Hypokalemia: Secondary | ICD-10-CM

## 2016-10-02 LAB — BASIC METABOLIC PANEL
BUN/Creatinine Ratio: 29 — ABNORMAL HIGH (ref 10–24)
BUN: 23 mg/dL (ref 8–27)
CO2: 28 mmol/L (ref 18–29)
Calcium: 10 mg/dL (ref 8.6–10.2)
Chloride: 97 mmol/L (ref 96–106)
Creatinine, Ser: 0.79 mg/dL (ref 0.76–1.27)
GFR calc Af Amer: 109 mL/min/{1.73_m2} (ref >59)
GFR calc non Af Amer: 94 mL/min/{1.73_m2} (ref >59)
Glucose: 123 mg/dL — ABNORMAL HIGH (ref 65–99)
Potassium: 3.3 mmol/L — ABNORMAL LOW (ref 3.5–5.2)
Sodium: 141 mmol/L (ref 134–144)

## 2016-10-02 MED ORDER — POTASSIUM CHLORIDE CRYS ER 20 MEQ PO TBCR: 20.0000 meq | EXTENDED_RELEASE_TABLET | Freq: Every day | ORAL | 6 refills | Status: DC

## 2016-10-02 NOTE — Telephone Encounter (Signed)
Phone call Discussed with patient potassium low will increase potassium to 20 mEq patient with a potassium supplements recheck BMP and a couple weeks

## 2016-10-16 ENCOUNTER — Emergency Department
Admission: EM | Admit: 2016-10-16 | Discharge: 2016-10-17 | Disposition: A | Discharge status: Home / Self Care | Payer: BLUE CROSS/BLUE SHIELD | Attending: Emergency Medicine | Admitting: Emergency Medicine

## 2016-10-16 ENCOUNTER — Encounter: Payer: Self-pay | Admitting: Emergency Medicine

## 2016-10-16 ENCOUNTER — Emergency Department: Payer: BLUE CROSS/BLUE SHIELD

## 2016-10-16 DIAGNOSIS — Z79899 Other long term (current) drug therapy: Secondary | ICD-10-CM | POA: Diagnosis not present

## 2016-10-16 DIAGNOSIS — I10 Essential (primary) hypertension: Secondary | ICD-10-CM | POA: Diagnosis not present

## 2016-10-16 DIAGNOSIS — I2510 Atherosclerotic heart disease of native coronary artery without angina pectoris: Secondary | ICD-10-CM | POA: Diagnosis not present

## 2016-10-16 DIAGNOSIS — R197 Diarrhea, unspecified: Secondary | ICD-10-CM | POA: Diagnosis present

## 2016-10-16 DIAGNOSIS — Z7984 Long term (current) use of oral hypoglycemic drugs: Secondary | ICD-10-CM | POA: Insufficient documentation

## 2016-10-16 DIAGNOSIS — Z7982 Long term (current) use of aspirin: Secondary | ICD-10-CM | POA: Diagnosis not present

## 2016-10-16 DIAGNOSIS — R079 Chest pain, unspecified: Secondary | ICD-10-CM | POA: Diagnosis not present

## 2016-10-16 DIAGNOSIS — E119 Type 2 diabetes mellitus without complications: Secondary | ICD-10-CM | POA: Diagnosis not present

## 2016-10-16 DIAGNOSIS — I252 Old myocardial infarction: Secondary | ICD-10-CM | POA: Diagnosis not present

## 2016-10-16 DIAGNOSIS — Z87891 Personal history of nicotine dependence: Secondary | ICD-10-CM | POA: Diagnosis not present

## 2016-10-16 DIAGNOSIS — A0811 Acute gastroenteropathy due to Norwalk agent: Secondary | ICD-10-CM

## 2016-10-16 DIAGNOSIS — K529 Noninfective gastroenteritis and colitis, unspecified: Secondary | ICD-10-CM | POA: Diagnosis not present

## 2016-10-16 LAB — CBC
HCT: 43 % (ref 40.0–52.0)
Hemoglobin: 15.2 g/dL (ref 13.0–18.0)
MCH: 30.3 pg (ref 26.0–34.0)
MCHC: 35.2 g/dL (ref 32.0–36.0)
MCV: 86.2 fL (ref 80.0–100.0)
Platelets: 246 10*3/uL (ref 150–440)
RBC: 4.99 MIL/uL (ref 4.40–5.90)
RDW: 13.1 % (ref 11.5–14.5)
WBC: 12.7 10*3/uL — ABNORMAL HIGH (ref 3.8–10.6)

## 2016-10-16 LAB — BASIC METABOLIC PANEL
Anion gap: 11 (ref 5–15)
BUN: 28 mg/dL — ABNORMAL HIGH (ref 6–20)
CO2: 25 mmol/L (ref 22–32)
Calcium: 8.8 mg/dL — ABNORMAL LOW (ref 8.9–10.3)
Chloride: 102 mmol/L (ref 101–111)
Creatinine, Ser: 0.96 mg/dL (ref 0.61–1.24)
GFR calc Af Amer: >60 mL/min (ref >60)
GFR calc non Af Amer: >60 mL/min (ref >60)
Glucose, Bld: 198 mg/dL — ABNORMAL HIGH (ref 65–99)
Potassium: 3 mmol/L — ABNORMAL LOW (ref 3.5–5.1)
Sodium: 138 mmol/L (ref 135–145)

## 2016-10-16 LAB — TROPONIN I: Troponin I: <0.03 ng/mL (ref <0.03)

## 2016-10-16 MED ORDER — SODIUM CHLORIDE 0.9 % IV BOLUS (SEPSIS)
1000.0000 mL | Freq: Once | INTRAVENOUS | Status: AC
  Administered 2016-10-16: 1000 mL via INTRAVENOUS

## 2016-10-16 MED ORDER — ONDANSETRON HCL 4 MG/2ML IJ SOLN
4.0000 mg | Freq: Once | INTRAMUSCULAR | Status: AC
  Administered 2016-10-17: 4 mg via INTRAVENOUS
  Filled 2016-10-16: qty 2

## 2016-10-16 MED ORDER — POTASSIUM CHLORIDE 20 MEQ PO PACK
40.0000 meq | PACK | Freq: Two times a day (BID) | ORAL | Status: DC
  Administered 2016-10-16: 40 meq via ORAL
  Filled 2016-10-16: qty 2

## 2016-10-16 NOTE — ED Triage Notes (Signed)
Pt reports N/V/D since today reports grand-daughter had symptoms this weekend, pt reports developed chest pain took one Nitro at home no pain at present reports feels acid in esophagus. Pt talks in complete sentences no respiratory distress noted

## 2016-10-16 NOTE — ED Provider Notes (Signed)
[facility] Emergency Department Provider Note   First MD Initiated Contact with Patient 10/16/16 2309     (approximate)  I have reviewed the triage vital signs and the nursing notes.   HISTORY  Chief Complaint Emesis; Diarrhea; and Chest Pain   HPI Clinton Dorsey is a 66 y.o. male with below list of chronic medical conditions presents to the emergency department with nausea vomiting and diarrhea since this weekend. Patient states that his granddaughter was diagnosed with normal virus and his wife currently has it as well. Patient admits to rapid heart rate earlier   Past Medical History:  Diagnosis Date  . Coronary artery disease    a. NSTEMI; b. 12/23/13 cath 11/2013: D1 80%, LCx 90% failed PCI, dRCA 99%, PL branch 99% s/p PCI/DES,  EF 55%; c. staged cardiac cath 12/2013: s/p PCI/DES to both D1 and mLCx, resdial LM 40%, ostial LCx 50%  . Depression   . Depression   . Diabetes mellitus (HCC)    a. poorly controlled  . Hyperlipidemia   . Hypertension   . Hypokalemia    a. recurrent  . Kidney stones   . MI (myocardial infarction) 11/2013  . Mitral regurgitation    a. echo 11/2013: EF 55-60%, mild LVH, mildly dilated LA, mild TR, mild to mod MR, mildly elevated RVSP    . Obesity   . Tobacco abuse     Patient Active Problem List   Diagnosis Date Noted  . Erectile dysfunction 07/10/2016  . Acute anxiety 12/15/2015  . Mitral regurgitation   . Tobacco abuse   . Depression   . Diabetes mellitus without complication (HCC) 06/23/2015  . Poorly controlled type 2 diabetes mellitus with circulatory disorder (HCC) 06/03/2015  . Angina pectoris (HCC)   . Hypokalemia   . Esophageal spasm   . Coronary artery disease involving native coronary artery of native heart with angina pectoris with documented spasm (HCC)   . Hyperlipidemia   . Coronary artery disease   . Angina effort (HCC) 12/23/2013  . Coronary atherosclerosis of native coronary artery 12/14/2013    . NSTEMI (non-ST elevated myocardial infarction) (HCC) 12/14/2013  . Stented coronary artery - D1, mCx 12/14/2013  . Essential hypertension 12/14/2013    Past Surgical History:  Procedure Laterality Date  . CARDIAC CATHETERIZATION  12/08/2013  . CORONARY ANGIOPLASTY  12/08/2013   s/p stent placement  . HERNIA REPAIR    . LAMINECTOMY    . PERCUTANEOUS CORONARY STENT INTERVENTION (PCI-S) N/A 12/23/2013   Procedure: PERCUTANEOUS CORONARY STENT INTERVENTION (PCI-S);  Surgeon: [name], MD;  Location: [facility] CATH LAB;  Service: Cardiovascular;  Laterality: N/A;  . SPINE SURGERY  2014  . TONSILLECTOMY      Prior to Admission medications   Medication Sig Start Date End Date Taking? Authorizing Provider  amLODipine (NORVASC) 10 MG tablet TAKE 1 TABLET (10 MG TOTAL) BY MOUTH DAILY. 04/17/16  Yes Antonieta Iba, MD  aspirin 81 MG tablet Take 81 mg by mouth daily.   Yes Historical Provider, MD  buPROPion (WELLBUTRIN XL) 150 MG 24 hr tablet Take 1 tablet (150 mg total) by mouth daily. 04/16/16  Yes Steele Sizer, MD  carvedilol (COREG) 12.5 MG tablet Take 1 tablet (12.5 mg total) by mouth 2 (two) times daily with a meal. 08/13/16  Yes Steele Sizer, MD  Cholecalciferol (VITAMIN D PO) Take 2 tablets by mouth daily.    Yes Historical Provider, MD  ezetimibe (ZETIA) 10 MG tablet  Take 1 tablet (10 mg total) by mouth daily. 04/17/16  Yes Antonieta Iba, MD  hydrochlorothiazide (HYDRODIURIL) 25 MG tablet Take 1 tablet (25 mg total) by mouth daily. 10/01/16  Yes Steele Sizer, MD  LORazepam (ATIVAN) 1 MG tablet Take 0.5-1 tablets (0.5-1 mg total) by mouth 2 (two) times daily as needed for anxiety. 08/13/16  Yes Steele Sizer, MD  losartan (COZAAR) 100 MG tablet TAKE 1 TABLET (100 MG TOTAL) BY MOUTH DAILY. 04/17/16  Yes Antonieta Iba, MD  magnesium oxide (MAG-OX) 400 MG tablet Take 400 mg by mouth daily.   Yes Historical Provider, MD  metFORMIN (GLUCOPHAGE) 500 MG tablet Take 1 tablet (500 mg  total) by mouth 2 (two) times daily with a meal. 10/01/16  Yes Steele Sizer, MD  Multiple Vitamin (MULTIVITAMIN) capsule Take 1 capsule by mouth daily.   Yes Historical Provider, MD  NITROSTAT 0.4 MG SL tablet PLACE 1 TABLET UNDER THE TONGUE EVERY 5 MINUTES AS NEEDED FOR CHEST PAIN 09/15/15  Yes Antonieta Iba, MD  potassium chloride (K-DUR,KLOR-CON) 20 MEQ tablet Take 1 tablet (20 mEq total) by mouth daily. 10/02/16  Yes Steele Sizer, MD  prasugrel (EFFIENT) 10 MG TABS tablet TAKE 1 TABLET (10 MG TOTAL) BY MOUTH DAILY. 04/17/16  Yes Antonieta Iba, MD  rosuvastatin (CRESTOR) 10 MG tablet Take 1 tablet (10 mg total) by mouth daily. 04/17/16  Yes Antonieta Iba, MD  sildenafil (REVATIO) 20 MG tablet Take 1 tablet (20 mg total) by mouth daily as needed. Take 1 to 5 tabs PRN 07/26/16  Yes Steele Sizer, MD  vitamin E 400 UNIT capsule Take 400 Units by mouth daily.   Yes Historical Provider, MD    Allergies Lipitor [atorvastatin]; Penicillins; and Zocor [simvastatin]  Family History  Problem Relation Age of Onset  . Heart attack Father 107    MI  . Heart disease Father     CABG  . Hypertension Mother   . Hypertension Sister     Social History Social History  Substance Use Topics  . Smoking status: Former Smoker    Types: Cigars    Quit date: 12/08/2013  . Smokeless tobacco: Never Used  . Alcohol use No     Comment: occasional    Review of Systems Constitutional: No fever/chills Eyes: No visual changes. ENT: No sore throat. Cardiovascular: Denies chest pain. Respiratory: Denies shortness of breath. Gastrointestinal: No abdominal pain.  Positive for nausea and vomiting and diarrhea  Genitourinary: Negative for dysuria. Musculoskeletal: Negative for back pain. Skin: Negative for rash. Neurological: Negative for headaches, focal weakness or numbness.  10-point ROS otherwise negative.  ____________________________________________   PHYSICAL EXAM:  VITAL SIGNS: ED  Triage Vitals  Enc Vitals Group     BP 10/16/16 1907 139/90     Pulse Rate 10/16/16 1907 81     Resp 10/16/16 1907 20     Temp 10/16/16 1907 99 F (37.2 C)     Temp Source 10/16/16 1907 Oral     SpO2 10/16/16 1907 99 %     Weight 10/16/16 1915 177 lb (80.3 kg)     Height 10/16/16 1915 5\' 6"  (1.676 m)     Head Circumference --      Peak Flow --      Pain Score 10/16/16 2323 0     Pain Loc --      Pain Edu? --      Excl. in GC? --  Constitutional: Alert and oriented. Well appearing and in no acute distress. Eyes: Conjunctivae are normal. PERRL. EOMI. Head: Atraumatic. Ears:  Healthy appearing ear canals and TMs bilaterally Nose: No congestion/rhinnorhea. Mouth/Throat: Mucous membranes are moist.  Oropharynx non-erythematous. Neck: No stridor.   Cardiovascular: Normal rate, regular rhythm. Good peripheral circulation. Grossly normal heart sounds. Respiratory: Normal respiratory effort.  No retractions. Lungs CTAB. Gastrointestinal: Soft and nontender. No distention.  Musculoskeletal: No lower extremity tenderness nor edema. No gross deformities of extremities. Neurologic:  Normal speech and language. No gross focal neurologic deficits are appreciated.  Skin:  Skin is warm, dry and intact. No rash noted. Psychiatric: Mood and affect are normal. Speech and behavior are normal.  ____________________________________________   LABS (all labs ordered are listed, but only abnormal results are displayed)  Labs Reviewed  CBC - Abnormal; Notable for the following:       Result Value   WBC 12.7 (*)    All other components within normal limits  BASIC METABOLIC PANEL - Abnormal; Notable for the following:    Potassium 3.0 (*)    Glucose, Bld 198 (*)    BUN 28 (*)    Calcium 8.8 (*)    All other components within normal limits  TROPONIN I  INFLUENZA PANEL BY PCR (TYPE A & B)   ____________________________________________  EKG  ED ECG REPORT I, [place] N Dominik Lauricella, the  attending physician, personally viewed and interpreted this ECG.   Date: 10/16/2016  EKG Time: 7:13 PM  Rate: 107  Rhythm: Sinus Tachycardia  Axis: Normal  Intervals: Normal  ST&T Change: None  ____________________________________________  RADIOLOGY I, [place] N Yvaine Jankowiak, personally viewed and evaluated these images (plain radiographs) as part of my medical decision making, as well as reviewing the written report by the radiologist.  Dg Chest 2 View  Result Date: 10/16/2016 CLINICAL DATA:  Acute onset of nausea, vomiting and diarrhea. Generalized chest pain. Initial encounter. EXAM: CHEST  2 VIEW COMPARISON:  Chest radiograph performed 05/26/2015 FINDINGS: The lungs are well-aerated and clear. There is no evidence of focal opacification, pleural effusion or pneumothorax. The heart is normal in size; the mediastinal contour is within normal limits. No acute osseous abnormalities are seen. IMPRESSION: No acute cardiopulmonary process seen. Electronically Signed   By: Roanna Raider M.D.   On: 10/16/2016 19:54      Procedures     INITIAL IMPRESSION / ASSESSMENT AND PLAN / ED COURSE  Pertinent labs & imaging results that were available during my care of the patient were reviewed by me and considered in my medical decision making (see chart for details).  66 year old male presenting to emergency department with history of physical exam consistent with neurovirus just confirmed with stool studies. Patient given IV normal saline and Zofran in the emergency Department we'll prescribe Zofran for home      ____________________________________________  FINAL CLINICAL IMPRESSION(S) / ED DIAGNOSES  Final diagnoses:  Norovirus     MEDICATIONS GIVEN DURING THIS VISIT:  Medications  potassium chloride (KLOR-CON) packet 40 mEq (not administered)  ondansetron (ZOFRAN) injection 4 mg (not administered)  sodium chloride 0.9 % bolus 1,000 mL (not administered)     NEW OUTPATIENT  MEDICATIONS STARTED DURING THIS VISIT:  New Prescriptions   No medications on file    Modified Medications   No medications on file    Discontinued Medications   No medications on file     Note:  This document was prepared using Dragon voice recognition software and may  include unintentional dictation errors.    Darci Current, MD 10/17/16 [phone]

## 2016-10-17 LAB — GASTROINTESTINAL PANEL BY PCR, STOOL (REPLACES STOOL CULTURE)

## 2016-10-17 LAB — INFLUENZA PANEL BY PCR (TYPE A & B)
Influenza A By PCR: NEGATIVE
Influenza B By PCR: NEGATIVE

## 2016-10-17 MED ORDER — ONDANSETRON 4 MG PO TBDP: 4.0000 mg | ORAL_TABLET | Freq: Three times a day (TID) | ORAL | 0 refills | Status: DC | PRN

## 2016-10-28 ENCOUNTER — Other Ambulatory Visit: Payer: Self-pay | Admitting: Family Medicine

## 2016-10-28 DIAGNOSIS — I10 Essential (primary) hypertension: Secondary | ICD-10-CM

## 2016-11-27 ENCOUNTER — Other Ambulatory Visit: Payer: Self-pay | Admitting: Family Medicine

## 2016-11-27 DIAGNOSIS — I10 Essential (primary) hypertension: Secondary | ICD-10-CM

## 2016-12-10 ENCOUNTER — Other Ambulatory Visit: Payer: Self-pay | Admitting: Cardiovascular Disease

## 2016-12-22 NOTE — Progress Notes (Signed)
Cardiology Office Note  Date:  12/25/2016   ID:  Clinton Dorsey, DOB [birth year], MRN [id number]  PCP:  Clinton Moss, MD   Chief Complaint  Patient presents with  . other    12 month follow up. Pt was walking and felt a little discomfort in chest.  Meds verbally reviewed with patient.      HPI:  [name] is a very pleasant 66 year old gentleman with a history of hyperlipidemia,  hypertension,  prediabetes,   prior occasional tobacco abuse  presented to [facility] 12/07/2013 with chest pain. 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 He had stent for distal RCA/ostial PL branch disease estimated at 99%. He also had mid circumflex with 99% disease, severe diagonal #1 disease. Attempt to place a stent to the mid circumflex was unsuccessful and given that he had significant dye and radiation exposure, it was recommended that he have PCI of his left circumflex and diagonal vessel at a later date. Followup catheterization at [place] with successful stents to his mid circumflex and first  diagonal vessel   echocardiogram in the hospital showed ejection fraction 50-55%, mildly dilated left atrium, mild TR, low to moderate MR, no focal wall motion abnormalities noted   PMH:   has a past medical history of Coronary artery disease; Depression; Depression; Diabetes mellitus (HCC); Hyperlipidemia; Hypertension; Hypokalemia; Kidney stones; MI (myocardial infarction) (11/2013); Mitral regurgitation; Obesity; and Tobacco abuse.  PSH:    Past Surgical History:  Procedure Laterality Date  . CARDIAC CATHETERIZATION  12/08/2013  . CORONARY ANGIOPLASTY  12/08/2013   s/p stent placement  . HERNIA REPAIR    . LAMINECTOMY    . PERCUTANEOUS CORONARY STENT INTERVENTION (PCI-S) N/A 12/23/2013   Procedure: PERCUTANEOUS CORONARY STENT INTERVENTION (PCI-S);  Surgeon: [name], MD;  Location: [facility] CATH LAB;  Service: Cardiovascular;  Laterality: N/A;  . SPINE SURGERY  2014  . TONSILLECTOMY      Current Outpatient Prescriptions  Medication Sig Dispense Refill  . amLODipine (NORVASC) 10 MG tablet TAKE 1 TABLET (10 MG TOTAL) BY MOUTH DAILY. 90 tablet 3  . aspirin 81 MG tablet Take 81 mg by mouth daily.    Marland Kitchen buPROPion (WELLBUTRIN XL) 150 MG 24 hr tablet Take 1 tablet (150 mg total) by mouth daily. 90 tablet 3  . carvedilol (COREG) 12.5 MG tablet Take 1 tablet (12.5 mg total) by mouth 2 (two) times daily with a meal. 60 tablet 6  . Cholecalciferol (VITAMIN D PO) Take 2 tablets by mouth daily.     Marland Kitchen ezetimibe (ZETIA) 10 MG tablet Take 1 tablet (10 mg total) by mouth daily.  90 tablet 3  . hydrochlorothiazide (HYDRODIURIL) 25 MG tablet Take 1 tablet (25 mg total) by mouth daily. 90 tablet 2  . LORazepam (ATIVAN) 1 MG tablet Take 0.5-1 tablets (0.5-1 mg total) by mouth 2 (two) times daily as needed for anxiety. 30 tablet 0  . losartan (COZAAR) 100 MG tablet TAKE 1 TABLET (100 MG TOTAL) BY MOUTH DAILY. 90 tablet 3  . magnesium oxide (MAG-OX) 400 MG tablet Take 400 mg by mouth daily.    .  metFORMIN (GLUCOPHAGE) 500 MG tablet Take 1 tablet (500 mg total) by mouth 2 (two) times daily with a meal. 180 tablet 2  . Multiple Vitamin (MULTIVITAMIN) capsule Take 1 capsule by mouth daily.    Marland Kitchen NITROSTAT 0.4 MG SL tablet PLACE 1 TABLET UNDER THE TONGUE EVERY 5 MINUTES AS NEEDED FOR CHEST PAIN 25 tablet 3  . ondansetron (ZOFRAN ODT) 4 MG disintegrating tablet Take 1 tablet (4 mg total) by mouth every 8 (eight) hours as needed for nausea or vomiting. 20 tablet 0  . potassium chloride (K-DUR,KLOR-CON) 10 MEQ tablet TAKE 1 TABLET(10 MEQ) BY MOUTH DAILY 30 tablet 0  . potassium chloride (K-DUR,KLOR-CON) 20 MEQ tablet Take 1 tablet (20 mEq total) by mouth daily. 30 tablet 6  . prasugrel (EFFIENT) 10 MG TABS tablet TAKE 1 TABLET BY MOUTH EVERY DAY 90 tablet 0  . rosuvastatin (CRESTOR) 10 MG tablet Take 1 tablet (10 mg total) by mouth daily. 90 tablet 3  . sildenafil (REVATIO) 20 MG tablet Take 1 tablet (20 mg total) by mouth daily as needed. Take 1 to 5 tabs PRN 50 tablet 12  . vitamin E 400 UNIT capsule Take 400 Units by mouth daily.     No current facility-administered medications for this visit.      Allergies:   Lipitor [atorvastatin]; Penicillins; and Zocor [simvastatin]   Social History:  The patient  reports that he quit smoking about 3 years ago. His smoking use included Cigars. He has never used smokeless tobacco. He reports that he does not drink alcohol or use drugs.   Family History:   family history includes Heart attack (age of onset: 20) in his father; Heart disease in his father; Hypertension in his mother and sister.    Review of Systems: Review of Systems  Constitutional: Negative.   Respiratory: Negative.   Cardiovascular: Negative.   Gastrointestinal: Negative.   Musculoskeletal: Negative.   Neurological: Negative.   Psychiatric/Behavioral: Negative.   All other systems reviewed and are negative.    PHYSICAL EXAM: VS:  BP (!) 152/84 (BP Location: Left Arm,  Patient Position: Sitting, Cuff Size: Normal)   Pulse 68   Ht 5\' 7"  (1.702 m)   Wt 178 lb 8 oz (81 kg)   BMI 27.96 kg/m  , BMI Body mass index is 27.96 kg/m. GEN: Well nourished, well developed, in no acute distress  HEENT: normal  Neck: no JVD, carotid bruits, or masses Cardiac: RRR; no murmurs, rubs, or gallops,no edema  Respiratory:  clear to auscultation bilaterally, normal work of breathing GI: soft, nontender, nondistended, + BS MS: no deformity or atrophy  Skin: warm and dry, no rash Neuro:  Strength and sensation are intact Psych: euthymic mood, full affect    Recent Labs: 07/10/2016: ALT (SGPT) Piccolo, Waived 40 10/16/2016: BUN 28; Creatinine, Ser 0.96; Hemoglobin 15.2; Platelets 246; Potassium 3.0; Sodium 138    Lipid Panel Lab Results  Component Value Date   CHOL 164 07/10/2016   HDL 40 10/13/2015  LDLCALC 46 10/13/2015   TRIG 108 07/10/2016      Wt Readings from Last 3 Encounters:  12/25/16 178 lb 8 oz (81 kg)  10/16/16 177 lb (80.3 kg)  10/01/16 181 lb (82.1 kg)       ASSESSMENT AND PLAN:  Atherosclerosis of native coronary artery of native heart with stable angina pectoris (HCC) - Plan: EKG 12-Lead Currently with no symptoms of angina. No further workup at this time. Continue current medication regimen.  NSTEMI (non-ST elevated myocardial infarction) (HCC) - Plan: EKG 12-Lead Long discussion with him concerning aspirin and effient Discussed risk and benefit of effient, and suggested he change to Plavix daily He is indicated he would like to be aggressive with his care  Essential hypertension - Plan: EKG 12-Lead Blood pressure borderline elevated, no medication changes made Suggested he continue to monitor pressures at home  Mixed hyperlipidemia - Plan: EKG 12-Lead Recommended he can a Crestor 20 mg with 10 mg Stay on Zetia On Crestor 10 he is not at goal  Poorly controlled type 2 diabetes mellitus with circulatory disorder (HCC) - Plan: EKG  12-Lead Hemoglobin A1c improved at 7.1, recommended he continue to work on his diet, start exercise program  Tobacco abuse - Plan: EKG 12-Lead We have encouraged him to continue to work on weaning his cigarettes and smoking cessation. He will continue to work on this and does not want any assistance with chantix.    Disposition:   F/U  12 months   Orders Placed This Encounter  Procedures  . EKG 12-Lead     Signed, Dossie Arbour, M.D., Ph.D. 12/25/2016  [facility] [place], [place] [phone]

## 2016-12-25 ENCOUNTER — Ambulatory Visit (INDEPENDENT_AMBULATORY_CARE_PROVIDER_SITE_OTHER): Payer: BLUE CROSS/BLUE SHIELD | Admitting: Cardiovascular Disease

## 2016-12-25 ENCOUNTER — Encounter: Payer: Self-pay | Admitting: Cardiovascular Disease

## 2016-12-25 VITALS — BP 152/84 | HR 68 | Ht 67.0 in | Wt 178.5 lb

## 2016-12-25 DIAGNOSIS — I214 Non-ST elevation (NSTEMI) myocardial infarction: Secondary | ICD-10-CM | POA: Diagnosis not present

## 2016-12-25 DIAGNOSIS — Z720 Tobacco use: Secondary | ICD-10-CM | POA: Diagnosis not present

## 2016-12-25 DIAGNOSIS — I10 Essential (primary) hypertension: Secondary | ICD-10-CM

## 2016-12-25 DIAGNOSIS — E782 Mixed hyperlipidemia: Secondary | ICD-10-CM | POA: Diagnosis not present

## 2016-12-25 DIAGNOSIS — I25118 Atherosclerotic heart disease of native coronary artery with other forms of angina pectoris: Secondary | ICD-10-CM | POA: Diagnosis not present

## 2016-12-25 DIAGNOSIS — E1165 Type 2 diabetes mellitus with hyperglycemia: Secondary | ICD-10-CM

## 2016-12-25 DIAGNOSIS — I2583 Coronary atherosclerosis due to lipid rich plaque: Secondary | ICD-10-CM

## 2016-12-25 DIAGNOSIS — I2510 Atherosclerotic heart disease of native coronary artery without angina pectoris: Secondary | ICD-10-CM

## 2016-12-25 DIAGNOSIS — E1159 Type 2 diabetes mellitus with other circulatory complications: Secondary | ICD-10-CM | POA: Diagnosis not present

## 2016-12-25 MED ORDER — ROSUVASTATIN CALCIUM 20 MG PO TABS: 20.0000 mg | ORAL_TABLET | Freq: Every day | ORAL | 3 refills | Status: DC

## 2016-12-25 MED ORDER — CLOPIDOGREL BISULFATE 75 MG PO TABS: 75.0000 mg | ORAL_TABLET | Freq: Every day | ORAL | 3 refills | Status: DC

## 2016-12-25 NOTE — Patient Instructions (Signed)
Medication Instructions:   Please alternate crestor 20 mg with 10  Change the effient to plavix one a day  Labwork:  No new labs needed  Testing/Procedures:  No further testing at this time   I recommend watching educational videos on topics of interest to you at:       www.goemmi.com  Enter code: [email or web address]    Follow-Up: It was a pleasure seeing you in the office today. Please call us if you have new issues that need to be addressed before your next appt.  [phone]  Your physician wants you to follow-up in: 12 months.  You will receive a reminder letter in the mail two months in advance. If you don't receive a letter, please call our office to schedule the follow-up appointment.  If you need a refill on your cardiac medications before your next appointment, please call your pharmacy.

## 2017-01-01 ENCOUNTER — Ambulatory Visit: Payer: BLUE CROSS/BLUE SHIELD | Admitting: Family Medicine

## 2017-02-14 ENCOUNTER — Ambulatory Visit: Payer: BLUE CROSS/BLUE SHIELD | Admitting: Family Medicine

## 2017-03-04 ENCOUNTER — Other Ambulatory Visit: Payer: Self-pay | Admitting: Cardiovascular Disease

## 2017-03-07 ENCOUNTER — Other Ambulatory Visit: Payer: Self-pay | Admitting: Cardiovascular Disease

## 2017-03-12 ENCOUNTER — Ambulatory Visit (INDEPENDENT_AMBULATORY_CARE_PROVIDER_SITE_OTHER): Payer: BLUE CROSS/BLUE SHIELD | Admitting: Family Medicine

## 2017-03-12 ENCOUNTER — Encounter: Payer: Self-pay | Admitting: Family Medicine

## 2017-03-12 VITALS — BP 161/95 | HR 83 | Wt 179.0 lb

## 2017-03-12 DIAGNOSIS — I2583 Coronary atherosclerosis due to lipid rich plaque: Secondary | ICD-10-CM

## 2017-03-12 DIAGNOSIS — I10 Essential (primary) hypertension: Secondary | ICD-10-CM | POA: Diagnosis not present

## 2017-03-12 DIAGNOSIS — E1159 Type 2 diabetes mellitus with other circulatory complications: Secondary | ICD-10-CM

## 2017-03-12 DIAGNOSIS — F3341 Major depressive disorder, recurrent, in partial remission: Secondary | ICD-10-CM

## 2017-03-12 DIAGNOSIS — E1165 Type 2 diabetes mellitus with hyperglycemia: Secondary | ICD-10-CM | POA: Diagnosis not present

## 2017-03-12 DIAGNOSIS — E782 Mixed hyperlipidemia: Secondary | ICD-10-CM | POA: Diagnosis not present

## 2017-03-12 DIAGNOSIS — E119 Type 2 diabetes mellitus without complications: Secondary | ICD-10-CM

## 2017-03-12 DIAGNOSIS — I2510 Atherosclerotic heart disease of native coronary artery without angina pectoris: Secondary | ICD-10-CM

## 2017-03-12 LAB — BAYER DCA HB A1C WAIVED: HB A1C (BAYER DCA - WAIVED): 7.2 % — ABNORMAL HIGH (ref <7.0)

## 2017-03-12 NOTE — Assessment & Plan Note (Signed)
Discuss hypertension need for better control patient reluctant to increase medications at this point. We will do better with lifestyle weight loss recheck 1 month if not better will consider more medications.

## 2017-03-12 NOTE — Assessment & Plan Note (Signed)
The current medical regimen is effective;  continue present plan and medications.  

## 2017-03-12 NOTE — Progress Notes (Signed)
BP (!) 161/95   Pulse 83   Wt 179 lb (81.2 kg)   SpO2 98%   BMI 28.04 kg/m    Subjective:    Patient ID: Clinton Dorsey, male    DOB: 16-Jul-1951, 66 y.o.   MRN: [id number]  HPI: Clinton Dorsey is a 66 y.o. male  Chief Complaint  Patient presents with  . Follow-up  . Hypertension  Great deal of stress with current lifestyle and intermittently elevated blood pressure and elevated blood pressure readings. Taking medications without problems taking faithfully. Has job that sometimes starts at 2:30 in the morning sometimes at 4:30 in the morning. Patient looking to make major lifestyle changes with better diet exercise nutrition. No chest pain chest tightness getting good reports from cardiology. No cholesterol issues taking medications faithfully Diabetes taking metformin once a day and noted low blood sugar spells. Relevant past medical, surgical, family and social history reviewed and updated as indicated. Interim medical history since our last visit reviewed. Allergies and medications reviewed and updated.  Review of Systems  Constitutional: Negative.   Respiratory: Negative.   Cardiovascular: Negative.     Per HPI unless specifically indicated above     Objective:    BP (!) 161/95   Pulse 83   Wt 179 lb (81.2 kg)   SpO2 98%   BMI 28.04 kg/m   Wt Readings from Last 3 Encounters:  03/12/17 179 lb (81.2 kg)  12/25/16 178 lb 8 oz (81 kg)  10/16/16 177 lb (80.3 kg)    Physical Exam  Constitutional: He is oriented to person, place, and time. He appears well-developed and well-nourished.  HENT:  Head: Normocephalic and atraumatic.  Eyes: Conjunctivae and EOM are normal.  Neck: Normal range of motion.  Cardiovascular: Normal rate, regular rhythm and normal heart sounds.   Pulmonary/Chest: Effort normal and breath sounds normal.  Musculoskeletal: Normal range of motion.  Neurological: He is alert and oriented to person, place, and time.  Skin: No erythema.    Psychiatric: He has a normal mood and affect. His behavior is normal. Judgment and thought content normal.        Assessment & Plan:   Problem List Items Addressed This Visit      Cardiovascular and Mediastinum   Essential hypertension - Primary    Discuss hypertension need for better control patient reluctant to increase medications at this point. We will do better with lifestyle weight loss recheck 1 month if not better will consider more medications.      Relevant Orders   Bayer DCA Hb A1c Waived (Completed)   Basic metabolic panel   Coronary artery disease    The current medical regimen is effective;  continue present plan and medications.       Poorly controlled type 2 diabetes mellitus with circulatory disorder (HCC)   Relevant Orders   Bayer DCA Hb A1c Waived (Completed)   Basic metabolic panel     Endocrine   Diabetes mellitus without complication (HCC)    The current medical regimen is effective;  continue present plan and medications.         Other   Hyperlipidemia   Relevant Orders   Bayer DCA Hb A1c Waived (Completed)   Basic metabolic panel   Depression    The current medical regimen is effective;  continue present plan and medications.           Follow up plan: Return for Physical Exam later this summer with blood pressure check.

## 2017-03-13 ENCOUNTER — Telehealth: Payer: Self-pay | Admitting: Family Medicine

## 2017-03-13 DIAGNOSIS — I10 Essential (primary) hypertension: Secondary | ICD-10-CM

## 2017-03-13 LAB — BASIC METABOLIC PANEL
BUN/Creatinine Ratio: 24 (ref 10–24)
BUN: 30 mg/dL — ABNORMAL HIGH (ref 8–27)
CO2: 24 mmol/L (ref 20–29)
Calcium: 10.2 mg/dL (ref 8.6–10.2)
Chloride: 96 mmol/L (ref 96–106)
Creatinine, Ser: 1.27 mg/dL (ref 0.76–1.27)
GFR calc Af Amer: 68 mL/min/{1.73_m2} (ref >59)
GFR calc non Af Amer: 59 mL/min/{1.73_m2} — ABNORMAL LOW (ref >59)
Glucose: 109 mg/dL — ABNORMAL HIGH (ref 65–99)
Potassium: 3.3 mmol/L — ABNORMAL LOW (ref 3.5–5.2)
Sodium: 139 mmol/L (ref 134–144)

## 2017-03-13 MED ORDER — POTASSIUM CHLORIDE CRYS ER 20 MEQ PO TBCR: 20.0000 meq | EXTENDED_RELEASE_TABLET | Freq: Two times a day (BID) | ORAL | 6 refills | Status: DC

## 2017-03-13 NOTE — Telephone Encounter (Signed)
Phone call Discussed with patient low potassium patient taking potassium supplements daily without problems discuss Will increase potassium from 20 mEq once a day to 20 twice a day Recheck BMP near future

## 2017-04-07 ENCOUNTER — Other Ambulatory Visit: Payer: Self-pay | Admitting: Family Medicine

## 2017-04-07 DIAGNOSIS — I10 Essential (primary) hypertension: Secondary | ICD-10-CM

## 2017-04-09 ENCOUNTER — Encounter: Payer: BLUE CROSS/BLUE SHIELD | Admitting: Family Medicine

## 2017-04-11 ENCOUNTER — Other Ambulatory Visit: Payer: Self-pay | Admitting: Cardiovascular Disease

## 2017-04-15 ENCOUNTER — Other Ambulatory Visit: Payer: Self-pay | Admitting: Family Medicine

## 2017-04-23 ENCOUNTER — Other Ambulatory Visit: Payer: Self-pay | Admitting: Cardiovascular Disease

## 2017-05-27 ENCOUNTER — Ambulatory Visit (INDEPENDENT_AMBULATORY_CARE_PROVIDER_SITE_OTHER): Payer: BLUE CROSS/BLUE SHIELD | Admitting: Family Medicine

## 2017-05-27 ENCOUNTER — Encounter: Payer: Self-pay | Admitting: Family Medicine

## 2017-05-27 VITALS — BP 166/84 | HR 83 | Ht 65.35 in | Wt 183.0 lb

## 2017-05-27 DIAGNOSIS — E119 Type 2 diabetes mellitus without complications: Secondary | ICD-10-CM

## 2017-05-27 DIAGNOSIS — Z7189 Other specified counseling: Secondary | ICD-10-CM | POA: Insufficient documentation

## 2017-05-27 DIAGNOSIS — E1165 Type 2 diabetes mellitus with hyperglycemia: Secondary | ICD-10-CM | POA: Diagnosis not present

## 2017-05-27 DIAGNOSIS — Z1329 Encounter for screening for other suspected endocrine disorder: Secondary | ICD-10-CM | POA: Diagnosis not present

## 2017-05-27 DIAGNOSIS — Z125 Encounter for screening for malignant neoplasm of prostate: Secondary | ICD-10-CM | POA: Diagnosis not present

## 2017-05-27 DIAGNOSIS — I25118 Atherosclerotic heart disease of native coronary artery with other forms of angina pectoris: Secondary | ICD-10-CM

## 2017-05-27 DIAGNOSIS — Z23 Encounter for immunization: Secondary | ICD-10-CM | POA: Diagnosis not present

## 2017-05-27 DIAGNOSIS — Z0000 Encounter for general adult medical examination without abnormal findings: Secondary | ICD-10-CM | POA: Diagnosis not present

## 2017-05-27 DIAGNOSIS — I10 Essential (primary) hypertension: Secondary | ICD-10-CM

## 2017-05-27 DIAGNOSIS — E782 Mixed hyperlipidemia: Secondary | ICD-10-CM

## 2017-05-27 DIAGNOSIS — E876 Hypokalemia: Secondary | ICD-10-CM | POA: Diagnosis not present

## 2017-05-27 DIAGNOSIS — E1159 Type 2 diabetes mellitus with other circulatory complications: Secondary | ICD-10-CM

## 2017-05-27 MED ORDER — BUPROPION HCL ER (XL) 150 MG PO TB24: 150.0000 mg | ORAL_TABLET | Freq: Every day | ORAL | 4 refills | Status: DC

## 2017-05-27 MED ORDER — POTASSIUM CHLORIDE CRYS ER 20 MEQ PO TBCR: 20.0000 meq | EXTENDED_RELEASE_TABLET | Freq: Two times a day (BID) | ORAL | 4 refills | Status: DC

## 2017-05-27 MED ORDER — LORAZEPAM 1 MG PO TABS: 0.5000 mg | ORAL_TABLET | Freq: Two times a day (BID) | ORAL | 0 refills | Status: DC | PRN

## 2017-05-27 MED ORDER — EZETIMIBE 10 MG PO TABS: 10.0000 mg | ORAL_TABLET | Freq: Every day | ORAL | 4 refills | Status: DC

## 2017-05-27 MED ORDER — CARVEDILOL 12.5 MG PO TABS: 12.5000 mg | ORAL_TABLET | Freq: Two times a day (BID) | ORAL | 4 refills | Status: DC

## 2017-05-27 MED ORDER — HYDROCHLOROTHIAZIDE 25 MG PO TABS: 25.0000 mg | ORAL_TABLET | Freq: Every day | ORAL | 4 refills | Status: DC

## 2017-05-27 MED ORDER — METFORMIN HCL 500 MG PO TABS: 500.0000 mg | ORAL_TABLET | ORAL | 4 refills | Status: DC

## 2017-05-27 MED ORDER — CLONIDINE HCL 0.1 MG PO TABS: 0.1000 mg | ORAL_TABLET | Freq: Two times a day (BID) | ORAL | 2 refills | Status: DC

## 2017-05-27 NOTE — Assessment & Plan Note (Signed)
The current medical regimen is effective;  continue present plan and medications.  

## 2017-05-27 NOTE — Assessment & Plan Note (Signed)
A voluntary discussion about advance care planning including the explanation and discussion of advance directives was extensively discussed  with the patient.  Explanation about the health care proxy and Living will was reviewed and packet with forms with explanation of how to fill them out was given.    

## 2017-05-27 NOTE — Progress Notes (Signed)
BP (!) 166/84 (BP Location: Left Arm)   Pulse 83   Ht 5' 5.35" (1.66 m)   Wt 183 lb (83 kg)   SpO2 95%   BMI 30.12 kg/m    Subjective:    Patient ID: Clinton Dorsey, male    DOB: 25-Nov-1950, 66 y.o.   MRN: [id number]  HPI: Clinton Dorsey is a 66 y.o. male  Chief Complaint  Patient presents with  . Annual Exam  Patient's noticed prominent right proximal clavicle area has been caring some really heavy bags on his right shoulder no noticed pain or discomfort with no current pain or discomfort just prominent clavicle. No cardiovascular symptoms. No coronary artery disease complaints doing well good reports from cardiology. Patient taking blood pressure medications faithfully without problems but chronically elevated blood pressure both here and at other doctor's offices. AWV metrics met Relevant past medical, surgical, family and social history reviewed and updated as indicated. Interim medical history since our last visit reviewed. Allergies and medications reviewed and updated.  Review of Systems  Constitutional: Negative.   HENT: Negative.   Eyes: Negative.   Respiratory: Negative.   Cardiovascular: Negative.   Gastrointestinal: Negative.   Endocrine: Negative.   Genitourinary: Negative.   Musculoskeletal: Negative.   Skin: Negative.   Allergic/Immunologic: Negative.   Neurological: Negative.   Hematological: Negative.   Psychiatric/Behavioral: Negative.     Per HPI unless specifically indicated above     Objective:    BP (!) 166/84 (BP Location: Left Arm)   Pulse 83   Ht 5' 5.35" (1.66 m)   Wt 183 lb (83 kg)   SpO2 95%   BMI 30.12 kg/m   Wt Readings from Last 3 Encounters:  05/27/17 183 lb (83 kg)  03/12/17 179 lb (81.2 kg)  12/25/16 178 lb 8 oz (81 kg)    Physical Exam  Constitutional: He is oriented to person, place, and time. He appears well-developed and well-nourished.  HENT:  Head: Normocephalic and atraumatic.  Right Ear: External ear normal.    Left Ear: External ear normal.  Eyes: Pupils are equal, round, and reactive to light. Conjunctivae and EOM are normal.  Neck: Normal range of motion. Neck supple.  Cardiovascular: Normal rate, regular rhythm, normal heart sounds and intact distal pulses.   Pulmonary/Chest: Effort normal and breath sounds normal.  Abdominal: Soft. Bowel sounds are normal. There is no splenomegaly or hepatomegaly.  Genitourinary: Rectum normal and penis normal.  Genitourinary Comments: BPH changes Small right inguinal hernia not noticed by patient.  Musculoskeletal: Normal range of motion.  Prominent right proximal clavicle at sternoclavicular junction  Neurological: He is alert and oriented to person, place, and time. He has normal reflexes.  Skin: No rash noted. No erythema.  Psychiatric: He has a normal mood and affect. His behavior is normal. Judgment and thought content normal.    Results for orders placed or performed in visit on 03/12/17  Bayer DCA Hb A1c Waived  Result Value Ref Range   Bayer DCA Hb A1c Waived 7.2 (H) <8.6 %  Basic metabolic panel  Result Value Ref Range   Glucose 109 (H) 65 - 99 mg/dL   BUN 30 (H) 8 - 27 mg/dL   Creatinine, Ser 1.27 0.76 - 1.27 mg/dL   GFR calc non Af Amer 59 (L) >59 mL/min/1.73   GFR calc Af Amer 68 >59 mL/min/1.73   BUN/Creatinine Ratio 24 10 - 24   Sodium 139 134 - 144 mmol/L   Potassium 3.3 (L)  3.5 - 5.2 mmol/L   Chloride 96 96 - 106 mmol/L   CO2 24 20 - 29 mmol/L   Calcium 10.2 8.6 - 10.2 mg/dL      Assessment & Plan:   Problem List Items Addressed This Visit      Cardiovascular and Mediastinum   Coronary atherosclerosis of native coronary artery (Chronic)    The current medical regimen is effective;  continue present plan and medications.       Relevant Medications   carvedilol (COREG) 12.5 MG tablet   ezetimibe (ZETIA) 10 MG tablet   hydrochlorothiazide (HYDRODIURIL) 25 MG tablet   cloNIDine (CATAPRES) 0.1 MG tablet   Essential  hypertension    Discuss hypertension on essential maximum tolerated dose of carvedilol so will not increase other medications maximum dose will add clonidine 0.1 mg twice a day recheck 1 month reviewed labs and consider nephrology referral pending labs.      Relevant Medications   carvedilol (COREG) 12.5 MG tablet   ezetimibe (ZETIA) 10 MG tablet   hydrochlorothiazide (HYDRODIURIL) 25 MG tablet   potassium chloride SA (K-DUR,KLOR-CON) 20 MEQ tablet   cloNIDine (CATAPRES) 0.1 MG tablet   Other Relevant Orders   CBC with Differential/Platelet   Poorly controlled type 2 diabetes mellitus with circulatory disorder (HCC)   Relevant Medications   carvedilol (COREG) 12.5 MG tablet   ezetimibe (ZETIA) 10 MG tablet   hydrochlorothiazide (HYDRODIURIL) 25 MG tablet   metFORMIN (GLUCOPHAGE) 500 MG tablet   cloNIDine (CATAPRES) 0.1 MG tablet   Other Relevant Orders   Comprehensive metabolic panel   Urinalysis, Routine w reflex microscopic     Endocrine   Diabetes mellitus without complication (HCC)    The current medical regimen is effective;  continue present plan and medications.       Relevant Medications   metFORMIN (GLUCOPHAGE) 500 MG tablet     Other   Hypokalemia   Hyperlipidemia    The current medical regimen is effective;  continue present plan and medications.       Relevant Medications   carvedilol (COREG) 12.5 MG tablet   ezetimibe (ZETIA) 10 MG tablet   hydrochlorothiazide (HYDRODIURIL) 25 MG tablet   cloNIDine (CATAPRES) 0.1 MG tablet   Other Relevant Orders   Lipid panel   Advanced care planning/counseling discussion    A voluntary discussion about advance care planning including the explanation and discussion of advance directives was extensively discussed  with the patient.  Explanation about the health care proxy and Living will was reviewed and packet with forms with explanation of how to fill them out was given.            Other Visit Diagnoses    Encounter  for wellness examination in adult    -  Primary   Annual physical exam       Needs flu shot       Relevant Orders   Flu vaccine HIGH DOSE PF (Fluzone High dose) (Completed)   Prostate cancer screening       Relevant Orders   PSA   Thyroid disorder screen       Relevant Orders   TSH   PE (physical exam), annual           Follow up plan: Return in about 4 weeks (around 06/24/2017).

## 2017-05-27 NOTE — Assessment & Plan Note (Signed)
Discuss hypertension on essential maximum tolerated dose of carvedilol so will not increase other medications maximum dose will add clonidine 0.1 mg twice a day recheck 1 month reviewed labs and consider nephrology referral pending labs.

## 2017-05-28 ENCOUNTER — Encounter: Payer: Self-pay | Admitting: Family Medicine

## 2017-05-28 LAB — URINALYSIS, ROUTINE W REFLEX MICROSCOPIC
Bilirubin, UA: NEGATIVE
Glucose, UA: NEGATIVE
Ketones, UA: NEGATIVE
Leukocytes, UA: NEGATIVE
Nitrite, UA: NEGATIVE
Protein, UA: NEGATIVE
RBC, UA: NEGATIVE
Specific Gravity, UA: 1.01 (ref 1.005–1.030)
Urobilinogen, Ur: 0.2 mg/dL (ref 0.2–1.0)
pH, UA: 7 (ref 5.0–7.5)

## 2017-05-28 LAB — LIPID PANEL
Chol/HDL Ratio: 2.7 ratio (ref 0.0–5.0)
Cholesterol, Total: 115 mg/dL (ref 100–199)
HDL: 42 mg/dL (ref >39)
LDL Calculated: 48 mg/dL (ref 0–99)
Triglycerides: 125 mg/dL (ref 0–149)
VLDL Cholesterol Cal: 25 mg/dL (ref 5–40)

## 2017-05-28 LAB — COMPREHENSIVE METABOLIC PANEL
ALT: 37 IU/L (ref 0–44)
AST: 28 IU/L (ref 0–40)
Albumin/Globulin Ratio: 1.6 (ref 1.2–2.2)
Albumin: 4.6 g/dL (ref 3.6–4.8)
Alkaline Phosphatase: 55 IU/L (ref 39–117)
BUN/Creatinine Ratio: 19 (ref 10–24)
BUN: 23 mg/dL (ref 8–27)
Bilirubin Total: 0.7 mg/dL (ref 0.0–1.2)
CO2: 26 mmol/L (ref 20–29)
Calcium: 9.9 mg/dL (ref 8.6–10.2)
Chloride: 99 mmol/L (ref 96–106)
Creatinine, Ser: 1.24 mg/dL (ref 0.76–1.27)
GFR calc Af Amer: 70 mL/min/{1.73_m2} (ref >59)
GFR calc non Af Amer: 60 mL/min/{1.73_m2} (ref >59)
Globulin, Total: 2.9 g/dL (ref 1.5–4.5)
Glucose: 108 mg/dL — ABNORMAL HIGH (ref 65–99)
Potassium: 3.6 mmol/L (ref 3.5–5.2)
Sodium: 143 mmol/L (ref 134–144)
Total Protein: 7.5 g/dL (ref 6.0–8.5)

## 2017-05-28 LAB — CBC WITH DIFFERENTIAL/PLATELET
Basophils Absolute: 0 10*3/uL (ref 0.0–0.2)
Basos: 0 %
EOS (ABSOLUTE): 0.1 10*3/uL (ref 0.0–0.4)
Eos: 2 %
Hematocrit: 41.3 % (ref 37.5–51.0)
Hemoglobin: 14.1 g/dL (ref 13.0–17.7)
Immature Grans (Abs): 0 10*3/uL (ref 0.0–0.1)
Immature Granulocytes: 0 %
Lymphocytes Absolute: 1.6 10*3/uL (ref 0.7–3.1)
Lymphs: 24 %
MCH: 30.3 pg (ref 26.6–33.0)
MCHC: 34.1 g/dL (ref 31.5–35.7)
MCV: 89 fL (ref 79–97)
Monocytes Absolute: 0.8 10*3/uL (ref 0.1–0.9)
Monocytes: 11 %
Neutrophils Absolute: 4.2 10*3/uL (ref 1.4–7.0)
Neutrophils: 63 %
Platelets: 235 10*3/uL (ref 150–379)
RBC: 4.66 x10E6/uL (ref 4.14–5.80)
RDW: 13.2 % (ref 12.3–15.4)
WBC: 6.7 10*3/uL (ref 3.4–10.8)

## 2017-05-28 LAB — TSH: TSH: 2.57 u[IU]/mL (ref 0.450–4.500)

## 2017-05-28 LAB — PSA: Prostate Specific Ag, Serum: 1.7 ng/mL (ref 0.0–4.0)

## 2017-07-01 ENCOUNTER — Ambulatory Visit: Payer: BLUE CROSS/BLUE SHIELD | Admitting: Family Medicine

## 2017-07-08 ENCOUNTER — Other Ambulatory Visit: Payer: Self-pay | Admitting: Cardiovascular Disease

## 2017-07-29 ENCOUNTER — Telehealth: Payer: Self-pay

## 2017-07-29 DIAGNOSIS — M89319 Hypertrophy of bone, unspecified shoulder: Secondary | ICD-10-CM

## 2017-07-29 NOTE — Telephone Encounter (Signed)
Copied from CRM [id number]. Topic: Inquiry >> Jul 29, 2017 11:50 AM Eston Mould B wrote: Reason for CRM:  Pt was recently in office,  Dr Dossie Arbour was to order an xray for his rt clavicle but pt has not heard anything else. I do not see an order   Routing to provider. Dr. Dossie Arbour, were you going to order an x-ray for this patient? Last OV was 05/27/2017.

## 2017-08-01 ENCOUNTER — Ambulatory Visit
Admission: RE | Admit: 2017-08-01 | Discharge: 2017-08-01 | Disposition: A | Discharge status: Home / Self Care | Payer: BLUE CROSS/BLUE SHIELD | Source: Ambulatory Visit | Attending: Family Medicine | Admitting: Family Medicine

## 2017-08-01 DIAGNOSIS — M89311 Hypertrophy of bone, right shoulder: Secondary | ICD-10-CM | POA: Insufficient documentation

## 2017-08-01 DIAGNOSIS — M19011 Primary osteoarthritis, right shoulder: Secondary | ICD-10-CM | POA: Diagnosis not present

## 2017-08-01 DIAGNOSIS — M89319 Hypertrophy of bone, unspecified shoulder: Secondary | ICD-10-CM

## 2017-10-24 ENCOUNTER — Other Ambulatory Visit: Payer: Self-pay | Admitting: Family Medicine

## 2017-10-24 DIAGNOSIS — I10 Essential (primary) hypertension: Secondary | ICD-10-CM

## 2017-12-05 ENCOUNTER — Other Ambulatory Visit: Payer: Self-pay | Admitting: Cardiovascular Disease

## 2017-12-05 DIAGNOSIS — I2583 Coronary atherosclerosis due to lipid rich plaque: Principal | ICD-10-CM

## 2017-12-05 DIAGNOSIS — I2510 Atherosclerotic heart disease of native coronary artery without angina pectoris: Secondary | ICD-10-CM

## 2017-12-07 ENCOUNTER — Other Ambulatory Visit: Payer: Self-pay | Admitting: Cardiovascular Disease

## 2017-12-09 ENCOUNTER — Telehealth: Payer: Self-pay

## 2017-12-25 NOTE — Telephone Encounter (Signed)
error 

## 2018-01-02 ENCOUNTER — Other Ambulatory Visit: Payer: Self-pay | Admitting: Family Medicine

## 2018-01-02 MED ORDER — SILDENAFIL CITRATE 20 MG PO TABS: 20.0000 mg | ORAL_TABLET | Freq: Every day | ORAL | 12 refills | Status: DC | PRN

## 2018-01-02 NOTE — Telephone Encounter (Addendum)
Patient is on nitroglycerin on his medication list. I will not refill this. Will need to wait until Dr. Dossie Arbour returns.   Rx cancelled at [facility].   Please let patient know.

## 2018-01-02 NOTE — Telephone Encounter (Signed)
RX to [facility] was cancelled.

## 2018-01-02 NOTE — Telephone Encounter (Signed)
Copied from CRM [id number]. Topic: Quick Communication - Rx Refill/Question >> Jan 02, 2018  2:22 PM Diana Eves B wrote: Medication: sildenafil (REVATIO) 20 MG tablet  Has the patient contacted their pharmacy? Yes.    Pt states that pharmacy called to request and kept getting told that he was not a patient at this office. Pt is out of medication.   (Agent: If no, request that the patient contact the pharmacy for the refill.) Preferred Pharmacy (with phone number or street name): [facility] - [place], [place] - [address] Agent: Please be advised that RX refills may take up to 3 business days. We ask that you follow-up with your pharmacy.

## 2018-01-05 NOTE — Telephone Encounter (Signed)
Call pt 

## 2018-01-06 ENCOUNTER — Other Ambulatory Visit: Payer: Self-pay | Admitting: Family Medicine

## 2018-01-06 MED ORDER — SILDENAFIL CITRATE 20 MG PO TABS: 20.0000 mg | ORAL_TABLET | Freq: Every day | ORAL | 12 refills | Status: DC | PRN

## 2018-01-06 NOTE — Addendum Note (Signed)
Addended by: Vonita Moss A on: 01/06/2018 12:09 PM   Modules accepted: Orders

## 2018-01-06 NOTE — Telephone Encounter (Signed)
Patient was transferred to provider for telephone conversation.   

## 2018-01-08 ENCOUNTER — Other Ambulatory Visit: Payer: Self-pay | Admitting: Cardiovascular Disease

## 2018-01-24 ENCOUNTER — Ambulatory Visit: Payer: BLUE CROSS/BLUE SHIELD | Admitting: Cardiovascular Disease

## 2018-02-09 ENCOUNTER — Other Ambulatory Visit: Payer: Self-pay | Admitting: Cardiovascular Disease

## 2018-02-18 ENCOUNTER — Ambulatory Visit: Payer: BLUE CROSS/BLUE SHIELD | Admitting: Cardiovascular Disease

## 2018-03-03 ENCOUNTER — Other Ambulatory Visit: Payer: Self-pay | Admitting: Cardiovascular Disease

## 2018-03-03 DIAGNOSIS — I2510 Atherosclerotic heart disease of native coronary artery without angina pectoris: Secondary | ICD-10-CM

## 2018-03-03 DIAGNOSIS — I2583 Coronary atherosclerosis due to lipid rich plaque: Principal | ICD-10-CM

## 2018-03-15 ENCOUNTER — Other Ambulatory Visit: Payer: Self-pay | Admitting: Cardiovascular Disease

## 2018-03-24 ENCOUNTER — Ambulatory Visit: Payer: BLUE CROSS/BLUE SHIELD | Admitting: Cardiovascular Disease

## 2018-03-25 NOTE — Progress Notes (Signed)
Cardiology Office Note  Date:  03/27/2018   ID:  Lacinda Axon, DOB 03/22/1951, MRN [id number]  PCP:  Steele Sizer, MD   Chief Complaint  Patient presents with  . other    Past due appointment for refills on medications. Meds reviewed by the pt. verbally. Denies chest pain or shortness of breath.     HPI:  [name] is a very pleasant 67 year old gentleman with a history of hyperlipidemia,  hypertension,  prediabetes,  prior occasional tobacco abuse , quit presented to [facility] 12/07/2013 with chest pain. non-ST elevation MI  Stents to LCX, diagonal, distal RCA (staged) Echo 10/2015: normal EF, RVSP 40, mod MR He presents today for follow-up of his coronary artery disease  Blood pressure at home 130s systolic Weight is up No regular exercise program Lots of vacation going to [place], in [place] Find watch his diet  Denies any chest pain concerning for angina No shortness of breath Works and does not shop in his retirement No tachycardia or palpitations  Total cholesterol 115 LDL 48 Hemoglobin A1c 7  In the ER 09/2016 with norovirus  EKG personally reviewed by myself on todays visit Shows normal sinus rhythm with rate 68 bpm unable to exclude old anterior MI, PVCs, APCs unable to scoot old inferior MI  Previous Holter and stress test, Holter did show APCs, PVCs, idioventricular escape complexes, poor short run of SVT Myoview stress test showing no ischemia  Other past medical history Cardiac catheterization was performed that showed severe three-vessel CAD. He had stent for distal RCA/ostial PL branch disease estimated at 99%. He also had mid circumflex with 99% disease, severe diagonal #1 disease. Attempt to place a stent to the mid circumflex was unsuccessful and given that he had significant dye and radiation exposure, it was recommended that he have PCI of his left circumflex and diagonal vessel at a later date. Followup catheterization at [place] with  successful stents to his mid circumflex and first diagonal vessel   echocardiogram in the hospital showed ejection fraction 50-55%, mildly dilated left atrium, mild TR, low to moderate MR, no focal wall motion abnormalities noted   PMH:   has a past medical history of Coronary artery disease, Depression, Depression, Diabetes mellitus (HCC), Hyperlipidemia, Hypertension, Hypokalemia, Kidney stones, MI (myocardial infarction) (HCC) (11/2013), Mitral regurgitation, Obesity, and Tobacco abuse.  PSH:    Past Surgical History:  Procedure Laterality Date  . CARDIAC CATHETERIZATION  12/08/2013  . CORONARY ANGIOPLASTY  12/08/2013   s/p stent placement  . HERNIA REPAIR    . LAMINECTOMY    . PERCUTANEOUS CORONARY STENT INTERVENTION (PCI-S) N/A 12/23/2013   Procedure: PERCUTANEOUS CORONARY STENT INTERVENTION (PCI-S);  Surgeon: [name], MD;  Location: [facility] CATH LAB;  Service: Cardiovascular;  Laterality: N/A;  . SPINE SURGERY  2014  . TONSILLECTOMY      Current Outpatient Medications  Medication Sig Dispense Refill  . amLODipine (NORVASC) 10 MG tablet TAKE 1 TABLET BY MOUTH DAILY 90 tablet 3  . aspirin 81 MG tablet Take 81 mg by mouth daily.    Marland Kitchen buPROPion (WELLBUTRIN XL) 150 MG 24 hr tablet Take 1 tablet (150 mg total) by mouth daily. 90 tablet 4  . carvedilol (COREG) 12.5 MG tablet Take 1 tablet (12.5 mg total) by mouth 2 (two) times daily with a meal. 180 tablet 4  . clopidogrel (PLAVIX) 75 MG tablet TAKE 1 TABLET(75 MG) BY MOUTH DAILY 30 tablet 0  . ezetimibe (ZETIA) 10 MG tablet Take 1  tablet (10 mg total) by mouth daily. 90 tablet 4  . hydrochlorothiazide (HYDRODIURIL) 25 MG tablet Take 1 tablet (25 mg total) by mouth daily. 90 tablet 4  . LORazepam (ATIVAN) 1 MG tablet Take 0.5-1 tablets (0.5-1 mg total) by mouth 2 (two) times daily as needed for anxiety. 30 tablet 0  . losartan (COZAAR) 100 MG tablet TAKE 1 TABLET BY MOUTH DAILY 90 tablet 2  . magnesium oxide (MAG-OX) 400 MG tablet  Take 400 mg by mouth daily.    . metFORMIN (GLUCOPHAGE) 500 MG tablet Take 1 tablet (500 mg total) by mouth 1 day or 1 dose. 90 tablet 4  . Multiple Vitamin (MULTIVITAMIN) capsule Take 1 capsule by mouth daily.    . potassium chloride SA (K-DUR,KLOR-CON) 20 MEQ tablet Take 20 mEq by mouth daily.    . rosuvastatin (CRESTOR) 20 MG tablet TAKE 1 TABLET(20 MG) BY MOUTH DAILY 90 tablet 0  . sildenafil (REVATIO) 20 MG tablet Take 1 tablet (20 mg total) by mouth daily as needed. Take 1 to 5 tabs PRN 50 tablet 12   No current facility-administered medications for this visit.      Allergies:   Penicillin g; Atorvastatin; Penicillins; and Simvastatin   Social History:  The patient  reports that he quit smoking about 4 years ago. His smoking use included cigars. He has never used smokeless tobacco. He reports that he does not drink alcohol or use drugs.   Family History:   family history includes Heart attack (age of onset: 57) in his father; Heart disease in his father; Hypertension in his mother and sister.    Review of Systems: Review of Systems  Constitutional: Negative.   Respiratory: Negative.   Cardiovascular: Negative.   Gastrointestinal: Negative.   Musculoskeletal: Negative.   Neurological: Negative.   Psychiatric/Behavioral: Negative.   All other systems reviewed and are negative.    PHYSICAL EXAM: VS:  BP (!) 150/80 (BP Location: Left Arm, Patient Position: Sitting, Cuff Size: Normal)   Pulse 68   Ht 5' 6.5" (1.689 m)   Wt 186 lb (84.4 kg)   BMI 29.57 kg/m  , BMI Body mass index is 29.57 kg/m. Constitutional:  oriented to person, place, and time. No distress.  HENT:  Head: Normocephalic and atraumatic.  Eyes:  no discharge. No scleral icterus.  Neck: Normal range of motion. Neck supple. No JVD present.  Cardiovascular: Normal rate, regular rhythm, normal heart sounds and intact distal pulses. Exam reveals no gallop and no friction rub. No edema No murmur  heard. Pulmonary/Chest: Effort normal and breath sounds normal. No stridor. No respiratory distress.  no wheezes.  no rales.  no tenderness.  Abdominal: Soft.  no distension.  no tenderness.  Musculoskeletal: Normal range of motion.  no  tenderness or deformity.  Neurological:  normal muscle tone. Coordination normal. No atrophy Skin: Skin is warm and dry. No rash noted. not diaphoretic.  Psychiatric:  normal mood and affect. behavior is normal. Thought content normal.    Recent Labs: 05/27/2017: ALT 37; BUN 23; Creatinine, Ser 1.24; Hemoglobin 14.1; Platelets 235; Potassium 3.6; Sodium 143; TSH 2.570    Lipid Panel Lab Results  Component Value Date   CHOL 115 05/27/2017   HDL 42 05/27/2017   LDLCALC 48 05/27/2017   TRIG 125 05/27/2017      Wt Readings from Last 3 Encounters:  03/27/18 186 lb (84.4 kg)  05/27/17 183 lb (83 kg)  03/12/17 179 lb (81.2 kg)  ASSESSMENT AND PLAN:  Atherosclerosis of native coronary artery of native heart with stable angina pectoris (HCC) - Plan: EKG 12-Lead Currently with no symptoms of angina. No further workup at this time. Continue current medication regimen.stable  NSTEMI (non-ST elevated myocardial infarction) (HCC) - Plan: EKG 12-Lead On Plavix daily Previously on Effient but had significant bruising Denies any anginal symptoms  Essential hypertension - Plan: EKG 12-Lead Blood pressure borderline elevated, no medication changes made Suggested he continue to monitor pressures at home  Mixed hyperlipidemia - Plan: EKG 12-Lead Recommended he stay on Crestor and Zetia Cholesterol at goal  Poorly controlled type 2 diabetes mellitus with circulatory disorder (HCC) - Plan: EKG 12-Lead Hemoglobin A1c  7.1,  Recommended dietary changes and weight loss  Tobacco abuse - Plan: EKG 12-Lead Reports that he stop smoking   Total encounter time more than 25 minutes  Greater than 50% was spent in counseling and coordination of care with  the patient  Disposition:   F/U  12 months   Orders Placed This Encounter  Procedures  . EKG 12-Lead     Signed, Dossie Arbour, M.D., Ph.D. 03/27/2018  [facility] [place], [place] [phone]

## 2018-03-27 ENCOUNTER — Encounter: Payer: Self-pay | Admitting: Cardiovascular Disease

## 2018-03-27 ENCOUNTER — Ambulatory Visit: Payer: BLUE CROSS/BLUE SHIELD | Admitting: Cardiovascular Disease

## 2018-03-27 VITALS — BP 150/80 | HR 68 | Ht 66.5 in | Wt 186.0 lb

## 2018-03-27 DIAGNOSIS — I25118 Atherosclerotic heart disease of native coronary artery with other forms of angina pectoris: Secondary | ICD-10-CM | POA: Diagnosis not present

## 2018-03-27 DIAGNOSIS — I2510 Atherosclerotic heart disease of native coronary artery without angina pectoris: Secondary | ICD-10-CM

## 2018-03-27 DIAGNOSIS — Z720 Tobacco use: Secondary | ICD-10-CM | POA: Diagnosis not present

## 2018-03-27 DIAGNOSIS — E1159 Type 2 diabetes mellitus with other circulatory complications: Secondary | ICD-10-CM | POA: Diagnosis not present

## 2018-03-27 DIAGNOSIS — E782 Mixed hyperlipidemia: Secondary | ICD-10-CM

## 2018-03-27 DIAGNOSIS — I10 Essential (primary) hypertension: Secondary | ICD-10-CM

## 2018-03-27 DIAGNOSIS — I340 Nonrheumatic mitral (valve) insufficiency: Secondary | ICD-10-CM | POA: Diagnosis not present

## 2018-03-27 DIAGNOSIS — I2583 Coronary atherosclerosis due to lipid rich plaque: Secondary | ICD-10-CM

## 2018-03-27 DIAGNOSIS — E1165 Type 2 diabetes mellitus with hyperglycemia: Secondary | ICD-10-CM | POA: Diagnosis not present

## 2018-03-27 MED ORDER — SILDENAFIL CITRATE 20 MG PO TABS: 20.0000 mg | ORAL_TABLET | Freq: Three times a day (TID) | ORAL | 3 refills | Status: DC | PRN

## 2018-03-27 MED ORDER — CARVEDILOL 12.5 MG PO TABS: 12.5000 mg | ORAL_TABLET | Freq: Two times a day (BID) | ORAL | 3 refills | Status: DC

## 2018-03-27 MED ORDER — CLOPIDOGREL BISULFATE 75 MG PO TABS: 75.0000 mg | ORAL_TABLET | Freq: Every day | ORAL | 3 refills | Status: DC

## 2018-03-27 MED ORDER — LOSARTAN POTASSIUM 100 MG PO TABS: 100.0000 mg | ORAL_TABLET | Freq: Every day | ORAL | 3 refills | Status: DC

## 2018-03-27 MED ORDER — HYDROCHLOROTHIAZIDE 25 MG PO TABS: 25.0000 mg | ORAL_TABLET | Freq: Every day | ORAL | 3 refills | Status: DC

## 2018-03-27 MED ORDER — POTASSIUM CHLORIDE CRYS ER 20 MEQ PO TBCR: 20.0000 meq | EXTENDED_RELEASE_TABLET | Freq: Every day | ORAL | 3 refills | Status: DC

## 2018-03-27 MED ORDER — AMLODIPINE BESYLATE 10 MG PO TABS: 10.0000 mg | ORAL_TABLET | Freq: Every day | ORAL | 3 refills | Status: DC

## 2018-03-27 MED ORDER — EZETIMIBE 10 MG PO TABS: 10.0000 mg | ORAL_TABLET | Freq: Every day | ORAL | 3 refills | Status: DC

## 2018-03-27 MED ORDER — ROSUVASTATIN CALCIUM 20 MG PO TABS: 20.0000 mg | ORAL_TABLET | Freq: Every day | ORAL | 3 refills | Status: DC

## 2018-03-27 NOTE — Patient Instructions (Signed)

## 2018-06-05 ENCOUNTER — Other Ambulatory Visit: Payer: Self-pay | Admitting: Family Medicine

## 2018-06-05 DIAGNOSIS — I10 Essential (primary) hypertension: Secondary | ICD-10-CM

## 2018-06-05 NOTE — Telephone Encounter (Signed)
Year's supply called in in July- not due.

## 2018-06-05 NOTE — Telephone Encounter (Signed)
Refill of hydrodiuril by historical provider  LOV 05/27/17 Dr. Dossie Arbour  LRF 03/27/18  #90  3 refills  [id number] [place], [place] - [address] AT [address]

## 2018-06-09 ENCOUNTER — Other Ambulatory Visit: Payer: Self-pay | Admitting: Family Medicine

## 2018-06-09 NOTE — Telephone Encounter (Signed)
Wellbutrin XL refill Last refill:05/27/17 #90/4 refill Last OV:05/27/17; First available CPE 09/25/18 ZPH:[id number] Pharmacy: [id number] [place], [place] - [address] AT [facility] OF [address] [phone] (Phone) [phone] (Fax)

## 2018-06-13 ENCOUNTER — Other Ambulatory Visit: Payer: Self-pay | Admitting: Family Medicine

## 2018-06-13 DIAGNOSIS — I10 Essential (primary) hypertension: Secondary | ICD-10-CM

## 2018-06-13 NOTE — Telephone Encounter (Signed)
Potassium 29 meq refill Last Refill: # 90 Last OV: 03/27/18 PCP: Dossie Arbour Pharmacy:[id number]

## 2018-06-17 ENCOUNTER — Telehealth: Payer: Self-pay | Admitting: Cardiovascular Disease

## 2018-06-17 DIAGNOSIS — I10 Essential (primary) hypertension: Secondary | ICD-10-CM

## 2018-06-17 MED ORDER — HYDROCHLOROTHIAZIDE 25 MG PO TABS: 25.0000 mg | ORAL_TABLET | Freq: Every day | ORAL | 3 refills | Status: DC

## 2018-06-17 NOTE — Telephone Encounter (Signed)
Refill sent for HCTZ 25 mg  

## 2018-06-17 NOTE — Telephone Encounter (Signed)
°*  STAT* If patient is at the pharmacy, call can be transferred to refill team.   1. Which medications need to be refilled? (please list name of each medication and dose if known) hydrochlorothiazide (HYDRODIURIL) 25 MG tablet    2. Which pharmacy/location (including street and city if local pharmacy) is medication to be sent to? [facility] on [address]  3. Do they need a 30 day or 90 day supply? 90 day

## 2018-06-19 DIAGNOSIS — I781 Nevus, non-neoplastic: Secondary | ICD-10-CM | POA: Diagnosis not present

## 2018-06-19 DIAGNOSIS — D485 Neoplasm of uncertain behavior of skin: Secondary | ICD-10-CM | POA: Diagnosis not present

## 2018-06-19 DIAGNOSIS — Z1283 Encounter for screening for malignant neoplasm of skin: Secondary | ICD-10-CM | POA: Diagnosis not present

## 2018-06-19 DIAGNOSIS — L821 Other seborrheic keratosis: Secondary | ICD-10-CM | POA: Diagnosis not present

## 2018-06-19 DIAGNOSIS — L578 Other skin changes due to chronic exposure to nonionizing radiation: Secondary | ICD-10-CM | POA: Diagnosis not present

## 2018-07-13 ENCOUNTER — Other Ambulatory Visit: Payer: Self-pay | Admitting: Family Medicine

## 2018-07-13 DIAGNOSIS — I10 Essential (primary) hypertension: Secondary | ICD-10-CM

## 2018-07-14 NOTE — Telephone Encounter (Signed)
Both these medications recently refilled with Dr. Mariah Milling.  Requested Prescriptions  Refused Prescriptions Disp Refills  . ezetimibe (ZETIA) 10 MG tablet [Pharmacy Med Name: EZETIMIBE 10MG  TABLETS] 90 tablet 0    Sig: TAKE 1 TABLET(10 MG) BY MOUTH DAILY     Cardiovascular:  Antilipid - Sterol Transport Inhibitors Failed - 07/13/2018  6:05 AM      Failed - Total Cholesterol in normal range and within 360 days    Cholesterol, Total  Date Value Ref Range Status  05/27/2017 115 100 - 199 mg/dL Final   Cholesterol  Date Value Ref Range Status  12/07/2013 182 0 - 200 mg/dL Final   Cholesterol Piccolo, Waived  Date Value Ref Range Status  07/10/2016 164 <200 mg/dL Final    Comment:                            Desirable                <200                         Borderline High      200- 239                         High                     >239          Failed - LDL in normal range and within 360 days    Ldl Cholesterol, Calc  Date Value Ref Range Status  12/07/2013 104 (H) 0 - 100 mg/dL Final   LDL Calculated  Date Value Ref Range Status  05/27/2017 48 0 - 99 mg/dL Final         Failed - HDL in normal range and within 360 days    HDL Cholesterol  Date Value Ref Range Status  12/07/2013 33 (L) 40 - 60 mg/dL Final   HDL  Date Value Ref Range Status  05/27/2017 42 >39 mg/dL Final         Failed - Triglycerides in normal range and within 360 days    Triglycerides  Date Value Ref Range Status  05/27/2017 125 0 - 149 mg/dL Final  27/61/4709 295 (H) 0 - 200 mg/dL Final   Triglycerides Piccolo,Waived  Date Value Ref Range Status  07/10/2016 108 <150 mg/dL Final    Comment:                            Normal                   <150                         Borderline High     150 - 199                         High                200 - 499                         Very High                >499  Failed - Valid encounter within last 12 months    Recent Outpatient  Visits          1 year ago Encounter for wellness examination in adult   [facility] Crissman, Redge Gainer, MD   1 year ago Essential hypertension   [facility] Crissman, Redge Gainer, MD   1 year ago Essential hypertension   [facility] Crissman, Redge Gainer, MD   1 year ago Essential hypertension   [facility] Crissman, Redge Gainer, MD   2 years ago Essential hypertension   [facility] Crissman, Redge Gainer, MD      Future Appointments            In 2 months Crissman, Redge Gainer, MD [facility]         . carvedilol (COREG) 12.5 MG tablet [Pharmacy Med Name: CARVEDILOL 12.5MG  TABLETS] 180 tablet 0    Sig: TAKE 1 TABLET(12.5 MG) BY MOUTH TWICE DAILY WITH A MEAL     Cardiovascular:  Beta Blockers Failed - 07/13/2018  6:05 AM      Failed - Last BP in normal range    BP Readings from Last 1 Encounters:  03/27/18 (!) 150/80         Failed - Valid encounter within last 6 months    Recent Outpatient Visits          1 year ago Encounter for wellness examination in adult   [facility] Crissman, Redge Gainer, MD   1 year ago Essential hypertension   [facility] Crissman, Redge Gainer, MD   1 year ago Essential hypertension   [facility] Crissman, Redge Gainer, MD   1 year ago Essential hypertension   [facility] Crissman, Redge Gainer, MD   2 years ago Essential hypertension   [facility] Crissman, Redge Gainer, MD      Future Appointments            In 2 months Crissman, Redge Gainer, MD [facility]           Passed - Last Heart Rate in normal range    Pulse Readings from Last 1 Encounters:  03/27/18 68

## 2018-07-16 ENCOUNTER — Other Ambulatory Visit: Payer: Self-pay

## 2018-07-16 DIAGNOSIS — E119 Type 2 diabetes mellitus without complications: Secondary | ICD-10-CM

## 2018-07-16 MED ORDER — METFORMIN HCL 500 MG PO TABS: 500.0000 mg | ORAL_TABLET | ORAL | 1 refills | Status: DC

## 2018-07-16 NOTE — Telephone Encounter (Signed)
Patient has appointment 09/25/18.

## 2018-08-25 ENCOUNTER — Encounter: Payer: Self-pay | Admitting: Family Medicine

## 2018-09-08 ENCOUNTER — Telehealth: Payer: Self-pay | Admitting: Family Medicine

## 2018-09-08 NOTE — Telephone Encounter (Signed)
Copied from CRM [id number]. Topic: Quick Communication - See Telephone Encounter >> Sep 08, 2018  5:45 PM Trula Slade wrote: CRM for notification. See Telephone encounter for: 09/08/18. Patient is taking care of his grand daughter who was just diagnosed with the flu.  He wanted to know if a prescription can be called in for [facility] flu and have it sent to his preferred pharmacy [facility] [address] [place], [place]

## 2018-09-09 MED ORDER — OSELTAMIVIR PHOSPHATE 75 MG PO CAPS: 75.0000 mg | ORAL_CAPSULE | Freq: Every day | ORAL | 0 refills | Status: DC

## 2018-09-09 NOTE — Telephone Encounter (Signed)
Rx sent for prophylactic dose

## 2018-09-25 ENCOUNTER — Encounter: Payer: Self-pay | Admitting: Family Medicine

## 2018-09-25 ENCOUNTER — Encounter

## 2018-11-04 DIAGNOSIS — H2513 Age-related nuclear cataract, bilateral: Secondary | ICD-10-CM | POA: Diagnosis not present

## 2018-11-04 DIAGNOSIS — E089 Diabetes mellitus due to underlying condition without complications: Secondary | ICD-10-CM | POA: Diagnosis not present

## 2018-11-04 DIAGNOSIS — H524 Presbyopia: Secondary | ICD-10-CM | POA: Diagnosis not present

## 2018-11-04 DIAGNOSIS — E119 Type 2 diabetes mellitus without complications: Secondary | ICD-10-CM | POA: Diagnosis not present

## 2018-11-04 DIAGNOSIS — H43393 Other vitreous opacities, bilateral: Secondary | ICD-10-CM | POA: Diagnosis not present

## 2018-11-24 ENCOUNTER — Ambulatory Visit (INDEPENDENT_AMBULATORY_CARE_PROVIDER_SITE_OTHER): Payer: BLUE CROSS/BLUE SHIELD | Admitting: Family Medicine

## 2018-11-24 ENCOUNTER — Encounter: Payer: Self-pay | Admitting: Family Medicine

## 2018-11-24 VITALS — BP 160/90 | HR 77 | Ht 65.35 in | Wt 181.0 lb

## 2018-11-24 DIAGNOSIS — Z0000 Encounter for general adult medical examination without abnormal findings: Secondary | ICD-10-CM

## 2018-11-24 DIAGNOSIS — E1165 Type 2 diabetes mellitus with hyperglycemia: Secondary | ICD-10-CM

## 2018-11-24 DIAGNOSIS — I25111 Atherosclerotic heart disease of native coronary artery with angina pectoris with documented spasm: Secondary | ICD-10-CM

## 2018-11-24 DIAGNOSIS — F3341 Major depressive disorder, recurrent, in partial remission: Secondary | ICD-10-CM | POA: Diagnosis not present

## 2018-11-24 DIAGNOSIS — Z23 Encounter for immunization: Secondary | ICD-10-CM

## 2018-11-24 DIAGNOSIS — Z7189 Other specified counseling: Secondary | ICD-10-CM

## 2018-11-24 DIAGNOSIS — E1159 Type 2 diabetes mellitus with other circulatory complications: Secondary | ICD-10-CM | POA: Diagnosis not present

## 2018-11-24 DIAGNOSIS — Z125 Encounter for screening for malignant neoplasm of prostate: Secondary | ICD-10-CM | POA: Diagnosis not present

## 2018-11-24 DIAGNOSIS — E876 Hypokalemia: Secondary | ICD-10-CM | POA: Diagnosis not present

## 2018-11-24 DIAGNOSIS — Z1211 Encounter for screening for malignant neoplasm of colon: Secondary | ICD-10-CM

## 2018-11-24 DIAGNOSIS — E782 Mixed hyperlipidemia: Secondary | ICD-10-CM

## 2018-11-24 DIAGNOSIS — Z1329 Encounter for screening for other suspected endocrine disorder: Secondary | ICD-10-CM

## 2018-11-24 DIAGNOSIS — E119 Type 2 diabetes mellitus without complications: Secondary | ICD-10-CM

## 2018-11-24 DIAGNOSIS — I10 Essential (primary) hypertension: Secondary | ICD-10-CM | POA: Diagnosis not present

## 2018-11-24 LAB — MICROSCOPIC EXAMINATION: Bacteria, UA: NONE SEEN

## 2018-11-24 LAB — URINALYSIS, ROUTINE W REFLEX MICROSCOPIC
Bilirubin, UA: NEGATIVE
Glucose, UA: NEGATIVE
Leukocytes, UA: NEGATIVE
Nitrite, UA: NEGATIVE
Specific Gravity, UA: 1.01 (ref 1.005–1.030)
Urobilinogen, Ur: 0.2 mg/dL (ref 0.2–1.0)
pH, UA: 7 (ref 5.0–7.5)

## 2018-11-24 LAB — BAYER DCA HB A1C WAIVED: HB A1C (BAYER DCA - WAIVED): 7 % — ABNORMAL HIGH (ref <7.0)

## 2018-11-24 MED ORDER — BUPROPION HCL ER (XL) 150 MG PO TB24: 150.0000 mg | ORAL_TABLET | Freq: Every day | ORAL | 4 refills | Status: DC

## 2018-11-24 MED ORDER — CARVEDILOL 25 MG PO TABS: 25.0000 mg | ORAL_TABLET | Freq: Two times a day (BID) | ORAL | 4 refills | Status: DC

## 2018-11-24 MED ORDER — METFORMIN HCL 500 MG PO TABS: 500.0000 mg | ORAL_TABLET | ORAL | 4 refills | Status: DC

## 2018-11-24 MED ORDER — BLOOD GLUCOSE TEST VI STRP: 1.0000 | ORAL_STRIP | Freq: Every day | 12 refills | Status: AC

## 2018-11-24 MED ORDER — POTASSIUM CHLORIDE CRYS ER 20 MEQ PO TBCR: 20.0000 meq | EXTENDED_RELEASE_TABLET | Freq: Every day | ORAL | 4 refills | Status: DC

## 2018-11-24 NOTE — Progress Notes (Signed)
Diabetic Eye from faxed to [facility] @ [phone]. Cologuard order submitted via online portal.

## 2018-11-24 NOTE — Addendum Note (Signed)
Addended by: Vonita Moss A on: 11/24/2018 03:19 PM   Modules accepted: Orders

## 2018-11-24 NOTE — Progress Notes (Signed)
BP (!) 160/90 (BP Location: Left Arm)   Pulse 77   Ht 5' 5.35" (1.66 m)   Wt 181 lb (82.1 kg)   SpO2 97%   BMI 29.79 kg/m    Subjective:    Patient ID: Clinton Dorsey, male    DOB: 04-26-1951, 68 y.o.   MRN: [id number]  HPI: Clinton Dorsey is a 68 y.o. male  Chief Complaint  Patient presents with  . Annual Exam    [facility] [place]   Discussed with patient elevated blood pressure and on chart review patient's had elevated blood pressure at home with course has better readings and shows reading of 140s over 80s. Reviewed patient's medications taken faithfully and today without problems. Reviewed patient's pulse goes down to an average of 58. No cardiovascular complaints chest pain chest tightness. Taking cholesterol medications without problems or issues. Reviewed not taking lorazepam. Bupropion XL 151 a day without problems. Taking potassium without problems. Takes metformin 500 once a day without problems no low blood sugar spells.  Relevant past medical, surgical, family and social history reviewed and updated as indicated. Interim medical history since our last visit reviewed. Allergies and medications reviewed and updated.  Review of Systems  Constitutional: Negative.   HENT: Negative.   Eyes: Negative.   Respiratory: Negative.   Cardiovascular: Negative.   Gastrointestinal: Negative.   Endocrine: Negative.   Genitourinary: Negative.   Musculoskeletal: Negative.   Skin: Negative.   Allergic/Immunologic: Negative.   Neurological: Negative.   Hematological: Negative.   Psychiatric/Behavioral: Negative.     Per HPI unless specifically indicated above     Objective:    BP (!) 160/90 (BP Location: Left Arm)   Pulse 77   Ht 5' 5.35" (1.66 m)   Wt 181 lb (82.1 kg)   SpO2 97%   BMI 29.79 kg/m   Wt Readings from Last 3 Encounters:  11/24/18 181 lb (82.1 kg)  03/27/18 186 lb (84.4 kg)  05/27/17 183 lb (83 kg)    Physical  Exam Constitutional:      Appearance: He is well-developed.  HENT:     Head: Normocephalic and atraumatic.     Right Ear: External ear normal.     Left Ear: External ear normal.  Eyes:     Conjunctiva/sclera: Conjunctivae normal.     Pupils: Pupils are equal, round, and reactive to light.  Neck:     Musculoskeletal: Normal range of motion and neck supple.  Cardiovascular:     Rate and Rhythm: Normal rate and regular rhythm.     Heart sounds: Normal heart sounds.  Pulmonary:     Effort: Pulmonary effort is normal.     Breath sounds: Normal breath sounds.  Abdominal:     General: Bowel sounds are normal.     Palpations: Abdomen is soft. There is no hepatomegaly or splenomegaly.  Genitourinary:    Penis: Normal.      Prostate: Normal.     Rectum: Normal.  Musculoskeletal: Normal range of motion.  Skin:    Findings: No erythema or rash.  Neurological:     Mental Status: He is alert and oriented to person, place, and time.     Deep Tendon Reflexes: Reflexes are normal and symmetric.  Psychiatric:        Behavior: Behavior normal.        Thought Content: Thought content normal.        Judgment: Judgment normal.     Results for orders placed or performed  in visit on 05/27/17  CBC with Differential/Platelet  Result Value Ref Range   WBC 6.7 3.4 - 10.8 x10E3/uL   RBC 4.66 4.14 - 5.80 x10E6/uL   Hemoglobin 14.1 13.0 - 17.7 g/dL   Hematocrit 89.2 11.9 - 51.0 %   MCV 89 79 - 97 fL   MCH 30.3 26.6 - 33.0 pg   MCHC 34.1 31.5 - 35.7 g/dL   RDW 41.7 40.8 - 14.4 %   Platelets 235 150 - 379 x10E3/uL   Neutrophils 63 Not Estab. %   Lymphs 24 Not Estab. %   Monocytes 11 Not Estab. %   Eos 2 Not Estab. %   Basos 0 Not Estab. %   Neutrophils Absolute 4.2 1.4 - 7.0 x10E3/uL   Lymphocytes Absolute 1.6 0.7 - 3.1 x10E3/uL   Monocytes Absolute 0.8 0.1 - 0.9 x10E3/uL   EOS (ABSOLUTE) 0.1 0.0 - 0.4 x10E3/uL   Basophils Absolute 0.0 0.0 - 0.2 x10E3/uL   Immature Granulocytes 0 Not  Estab. %   Immature Grans (Abs) 0.0 0.0 - 0.1 x10E3/uL  Comprehensive metabolic panel  Result Value Ref Range   Glucose 108 (H) 65 - 99 mg/dL   BUN 23 8 - 27 mg/dL   Creatinine, Ser 8.18 0.76 - 1.27 mg/dL   GFR calc non Af Amer 60 >59 mL/min/1.73   GFR calc Af Amer 70 >59 mL/min/1.73   BUN/Creatinine Ratio 19 10 - 24   Sodium 143 134 - 144 mmol/L   Potassium 3.6 3.5 - 5.2 mmol/L   Chloride 99 96 - 106 mmol/L   CO2 26 20 - 29 mmol/L   Calcium 9.9 8.6 - 10.2 mg/dL   Total Protein 7.5 6.0 - 8.5 g/dL   Albumin 4.6 3.6 - 4.8 g/dL   Globulin, Total 2.9 1.5 - 4.5 g/dL   Albumin/Globulin Ratio 1.6 1.2 - 2.2   Bilirubin Total 0.7 0.0 - 1.2 mg/dL   Alkaline Phosphatase 55 39 - 117 IU/L   AST 28 0 - 40 IU/L   ALT 37 0 - 44 IU/L  Lipid panel  Result Value Ref Range   Cholesterol, Total 115 100 - 199 mg/dL   Triglycerides 563 0 - 149 mg/dL   HDL 42 >14 mg/dL   VLDL Cholesterol Cal 25 5 - 40 mg/dL   LDL Calculated 48 0 - 99 mg/dL   Chol/HDL Ratio 2.7 0.0 - 5.0 ratio  PSA  Result Value Ref Range   Prostate Specific Ag, Serum 1.7 0.0 - 4.0 ng/mL  TSH  Result Value Ref Range   TSH 2.570 0.450 - 4.500 uIU/mL  Urinalysis, Routine w reflex microscopic  Result Value Ref Range   Specific Gravity, UA 1.010 1.005 - 1.030   pH, UA 7.0 5.0 - 7.5   Color, UA Yellow Yellow   Appearance Ur Clear Clear   Leukocytes, UA Negative Negative   Protein, UA Negative Negative/Trace   Glucose, UA Negative Negative   Ketones, UA Negative Negative   RBC, UA Negative Negative   Bilirubin, UA Negative Negative   Urobilinogen, Ur 0.2 0.2 - 1.0 mg/dL   Nitrite, UA Negative Negative      Assessment & Plan:   Problem List Items Addressed This Visit      Cardiovascular and Mediastinum   Essential hypertension    Discussed elevated blood pressure and overweight.  Patient will aggressively work on weight loss. Also will increase carvedilol from 12.5 to 25 mg twice a day with cautions about bradycardia  patient  has watch that monitors.      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       Relevant Medications   carvedilol (COREG) 25 MG tablet   Poorly controlled type 2 diabetes mellitus with circulatory disorder (HCC)    The current medical regimen is effective;  continue present plan and medications.       Relevant Medications   metFORMIN (GLUCOPHAGE) 500 MG tablet   carvedilol (COREG) 25 MG tablet   Other Relevant Orders   Bayer DCA Hb A1c Waived     Other   Hypokalemia   Relevant Orders   Comprehensive metabolic panel   Hyperlipidemia    The current medical regimen is effective;  continue present plan and medications.       Relevant Medications   carvedilol (COREG) 25 MG tablet   Other Relevant Orders   CBC with Differential/Platelet   Comprehensive metabolic panel   Lipid panel   Urinalysis, Routine w reflex microscopic   Depression    The current medical regimen is effective;  continue present plan and medications.       Relevant Medications   buPROPion (WELLBUTRIN XL) 150 MG 24 hr tablet   Advanced care planning/counseling discussion    A voluntary discussion about advanced care planning including explanation and discussion of advanced directives was extentively discussed with the patient.  Explained about the healthcare proxy and living will was reviewed and packet with forms with expiration of how to fill them out was given.  Time spent: Encounter 16+ min individuals present: Patient      Recurrent major depressive disorder, in partial  remission (HCC)    The current medical regimen is effective;  continue present plan and medications.       Relevant Medications   buPROPion (WELLBUTRIN XL) 150 MG 24 hr tablet    Other Visit Diagnoses    Need for pneumococcal vaccination    -  Primary   Relevant Orders   Pneumococcal polysaccharide vaccine 23-valent greater than or equal to 2yo subcutaneous/IM (Completed)   Colon cancer screening       Relevant Orders   Cologuard   Prostate cancer screening       Relevant Orders   PSA   Thyroid disorder screen       Relevant Orders   TSH   Diabetes mellitus without complication (HCC)       Relevant Medications   metFORMIN (GLUCOPHAGE) 500 MG tablet       Follow up plan: Return in about 6 months (around 05/27/2019) for Hemoglobin A1c, BMP,  Lipids, ALT, AST.

## 2018-11-24 NOTE — Assessment & Plan Note (Signed)
The current medical regimen is effective;  continue present plan and medications.  

## 2018-11-24 NOTE — Patient Instructions (Signed)

## 2018-11-24 NOTE — Assessment & Plan Note (Signed)
A voluntary discussion about advanced care planning including explanation and discussion of advanced directives was extentively discussed with the patient.  Explained about the healthcare proxy and living will was reviewed and packet with forms with expiration of how to fill them out was given.  Time spent: Encounter 16+ min individuals present: Patient 

## 2018-11-24 NOTE — Assessment & Plan Note (Signed)
Discussed elevated blood pressure and overweight.  Patient will aggressively work on weight loss. Also will increase carvedilol from 12.5 to 25 mg twice a day with cautions about bradycardia patient has watch that monitors.

## 2018-11-25 ENCOUNTER — Encounter: Payer: Self-pay | Admitting: Family Medicine

## 2018-11-25 LAB — CBC WITH DIFFERENTIAL/PLATELET
Basophils Absolute: 0 10*3/uL (ref 0.0–0.2)
Basos: 1 %
EOS (ABSOLUTE): 0.1 10*3/uL (ref 0.0–0.4)
Eos: 1 %
Hematocrit: 41.6 % (ref 37.5–51.0)
Hemoglobin: 14.5 g/dL (ref 13.0–17.7)
Immature Grans (Abs): 0 10*3/uL (ref 0.0–0.1)
Immature Granulocytes: 0 %
Lymphocytes Absolute: 1.3 10*3/uL (ref 0.7–3.1)
Lymphs: 22 %
MCH: 30.2 pg (ref 26.6–33.0)
MCHC: 34.9 g/dL (ref 31.5–35.7)
MCV: 87 fL (ref 79–97)
Monocytes Absolute: 0.5 10*3/uL (ref 0.1–0.9)
Monocytes: 9 %
Neutrophils Absolute: 3.9 10*3/uL (ref 1.4–7.0)
Neutrophils: 67 %
Platelets: 284 10*3/uL (ref 150–450)
RBC: 4.8 x10E6/uL (ref 4.14–5.80)
RDW: 12.8 % (ref 11.6–15.4)
WBC: 5.8 10*3/uL (ref 3.4–10.8)

## 2018-11-25 LAB — COMPREHENSIVE METABOLIC PANEL
ALT: 38 IU/L (ref 0–44)
AST: 33 IU/L (ref 0–40)
Albumin/Globulin Ratio: 2.1 (ref 1.2–2.2)
Albumin: 4.8 g/dL (ref 3.8–4.8)
Alkaline Phosphatase: 59 IU/L (ref 39–117)
BUN/Creatinine Ratio: 19 (ref 10–24)
BUN: 19 mg/dL (ref 8–27)
Bilirubin Total: 0.8 mg/dL (ref 0.0–1.2)
CO2: 28 mmol/L (ref 20–29)
Calcium: 9.8 mg/dL (ref 8.6–10.2)
Chloride: 95 mmol/L — ABNORMAL LOW (ref 96–106)
Creatinine, Ser: 0.98 mg/dL (ref 0.76–1.27)
GFR calc Af Amer: 92 mL/min/{1.73_m2} (ref >59)
GFR calc non Af Amer: 79 mL/min/{1.73_m2} (ref >59)
Globulin, Total: 2.3 g/dL (ref 1.5–4.5)
Glucose: 121 mg/dL — ABNORMAL HIGH (ref 65–99)
Potassium: 3.8 mmol/L (ref 3.5–5.2)
Sodium: 142 mmol/L (ref 134–144)
Total Protein: 7.1 g/dL (ref 6.0–8.5)

## 2018-11-25 LAB — LIPID PANEL
Chol/HDL Ratio: 3.1 ratio (ref 0.0–5.0)
Cholesterol, Total: 121 mg/dL (ref 100–199)
HDL: 39 mg/dL — ABNORMAL LOW (ref >39)
LDL Calculated: 55 mg/dL (ref 0–99)
Triglycerides: 133 mg/dL (ref 0–149)
VLDL Cholesterol Cal: 27 mg/dL (ref 5–40)

## 2018-11-25 LAB — PSA: Prostate Specific Ag, Serum: 1.6 ng/mL (ref 0.0–4.0)

## 2018-11-25 LAB — TSH: TSH: 3.17 u[IU]/mL (ref 0.450–4.500)

## 2018-11-26 ENCOUNTER — Ambulatory Visit: Payer: BLUE CROSS/BLUE SHIELD | Admitting: Family Medicine

## 2019-01-30 ENCOUNTER — Other Ambulatory Visit: Payer: Self-pay | Admitting: Family Medicine

## 2019-01-30 DIAGNOSIS — E119 Type 2 diabetes mellitus without complications: Secondary | ICD-10-CM

## 2019-02-17 ENCOUNTER — Telehealth: Payer: Self-pay | Admitting: Cardiovascular Disease

## 2019-02-17 ENCOUNTER — Ambulatory Visit: Payer: BLUE CROSS/BLUE SHIELD | Admitting: Family Medicine

## 2019-02-17 NOTE — Telephone Encounter (Signed)
Pending

## 2019-02-19 ENCOUNTER — Ambulatory Visit: Payer: BLUE CROSS/BLUE SHIELD | Admitting: Family Medicine

## 2019-04-03 ENCOUNTER — Telehealth: Payer: Self-pay | Admitting: Cardiovascular Disease

## 2019-04-03 NOTE — Progress Notes (Signed)
Cardiology Office Note  Date:  04/06/2019   ID:  Clinton Dorsey, DOB 1950/10/19, MRN [id number]  PCP:  Steele Sizer, MD   Chief Complaint  Patient presents with  . Other    12 month follow up. Patient denies chest pain and SOB at this time. Meds reviewed verbally with patient.     HPI:  [name] is a very pleasant 68 year-old gentleman with a history of hyperlipidemia,  hypertension,  prediabetes,  prior occasional tobacco abuse , quit presented to [facility] 12/07/2013 with chest pain. non-ST elevation MI  Stents to LCX, diagonal, distal RCA (staged) Echo 10/2015: normal EF, RVSP 40, mod MR He presents today for follow-up of his coronary artery disease  Feels well, Weight is up  No regular exercise program  Denies any chest pain concerning for angina No shortness of breath  No tachycardia or palpitations  Labs reviewed Total cholesterol 121 LDL 55 Hemoglobin A1c 7.0  EKG personally reviewed by myself on todays visit Shows normal sinus rhythm with rate 68 bpm , old inferior MI  Previous Holter and stress test, Holter did show APCs, PVCs, idioventricular escape complexes, poor short run of SVT Myoview stress test showing no ischemia  Other past medical history Cardiac catheterization was performed that showed severe three-vessel CAD. He had stent for distal RCA/ostial PL branch disease estimated at 99%. He also had mid circumflex with 99% disease, severe diagonal #1 disease. Attempt to place a stent to the mid circumflex was unsuccessful and given that he had significant dye and radiation exposure, it was recommended that he have PCI of his left circumflex and diagonal vessel at a later date. Followup catheterization at [place] with successful stents to his mid circumflex and first diagonal vessel   echocardiogram in the hospital showed ejection fraction 50-55%, mildly dilated left atrium, mild TR, low to moderate MR, no focal wall motion abnormalities  noted   PMH:   has a past medical history of Coronary artery disease, Depression, Depression, Diabetes mellitus (HCC), Hyperlipidemia, Hypertension, Hypokalemia, Kidney stones, MI (myocardial infarction) (HCC) (11/2013), Mitral regurgitation, Obesity, and Tobacco abuse.  PSH:    Past Surgical History:  Procedure Laterality Date  . CARDIAC CATHETERIZATION  12/08/2013  . CORONARY ANGIOPLASTY  12/08/2013   s/p stent placement  . HERNIA REPAIR    . LAMINECTOMY    . PERCUTANEOUS CORONARY STENT INTERVENTION (PCI-S) N/A 12/23/2013   Procedure: PERCUTANEOUS CORONARY STENT INTERVENTION (PCI-S);  Surgeon: [name], MD;  Location: [facility] CATH LAB;  Service: Cardiovascular;  Laterality: N/A;  . SPINE SURGERY  2014  . TONSILLECTOMY      Current Outpatient Medications  Medication Sig Dispense Refill  . amLODipine (NORVASC) 10 MG tablet Take 1 tablet (10 mg total) by mouth daily. 90 tablet 3  . aspirin 81 MG tablet Take 81 mg by mouth daily.    Marland Kitchen buPROPion (WELLBUTRIN XL) 150 MG 24 hr tablet Take 1 tablet (150 mg total) by mouth daily. 90 tablet 4  . carvedilol (COREG) 25 MG tablet Take 12.5 mg by mouth 2 (two) times daily with a meal.    . clopidogrel (PLAVIX) 75 MG tablet Take 1 tablet (75 mg total) by mouth daily. 90 tablet 3  . ezetimibe (ZETIA) 10 MG tablet Take 1 tablet (10 mg total) by mouth daily. 90 tablet 3  . Glucose Blood (BLOOD GLUCOSE TEST STRIPS) STRP 1 strip by In Vitro route daily. 100 each 12  . hydrochlorothiazide (HYDRODIURIL) 25 MG tablet Take  1 tablet (25 mg total) by mouth daily. 90 tablet 3  . LORazepam (ATIVAN) 1 MG tablet Take 0.5-1 tablets (0.5-1 mg total) by mouth 2 (two) times daily as needed for anxiety. 30 tablet 0  . losartan (COZAAR) 100 MG tablet Take 1 tablet (100 mg total) by mouth daily. 90 tablet 3  . magnesium oxide (MAG-OX) 400 MG tablet Take 400 mg by mouth daily.    . metFORMIN (GLUCOPHAGE) 500 MG tablet TAKE 1 TABLET BY MOUTH ONCE DAILY 90 tablet 2  .  Multiple Vitamin (MULTIVITAMIN) capsule Take 1 capsule by mouth daily.    . potassium chloride SA (K-DUR,KLOR-CON) 20 MEQ tablet Take 1 tablet (20 mEq total) by mouth daily. 90 tablet 3  . potassium chloride SA (K-DUR,KLOR-CON) 20 MEQ tablet Take 1 tablet (20 mEq total) by mouth daily. 90 tablet 4  . rosuvastatin (CRESTOR) 20 MG tablet Take 1 tablet (20 mg total) by mouth daily. 90 tablet 3  . sildenafil (REVATIO) 20 MG tablet Take 1 tablet (20 mg total) by mouth 3 (three) times daily as needed. Take 1 to 5 tabs PRN 90 tablet 3   No current facility-administered medications for this visit.      Allergies:   Penicillin g, Atorvastatin, Penicillins, and Simvastatin   Social History:  The patient  reports that he quit smoking about 5 years ago. His smoking use included cigars. He has never used smokeless tobacco. He reports that he does not drink alcohol or use drugs.   Family History:   family history includes Heart attack (age of onset: 65) in his father; Heart disease in his father; Hypertension in his mother and sister.    Review of Systems: Review of Systems  Constitutional: Negative.   HENT: Negative.   Respiratory: Negative.   Cardiovascular: Negative.   Gastrointestinal: Negative.   Musculoskeletal: Negative.   Neurological: Negative.   Psychiatric/Behavioral: Negative.   All other systems reviewed and are negative.   PHYSICAL EXAM: VS:  BP (!) 180/100 (BP Location: Left Arm, Patient Position: Sitting, Cuff Size: Normal)   Pulse 68   Ht 5\' 6"  (1.676 m)   Wt 185 lb (83.9 kg)   BMI 29.86 kg/m  , BMI Body mass index is 29.86 kg/m. Constitutional:  oriented to person, place, and time. No distress.  HENT:  Head: Grossly normal Eyes:  no discharge. No scleral icterus.  Neck: No JVD, no carotid bruits  Cardiovascular: Regular rate and rhythm, no murmurs appreciated Pulmonary/Chest: Clear to auscultation bilaterally, no wheezes or rails Abdominal: Soft.  no distension.  no  tenderness.  Musculoskeletal: Normal range of motion Neurological:  normal muscle tone. Coordination normal. No atrophy Skin: Skin warm and dry Psychiatric: normal affect, pleasant  Recent Labs: 11/24/2018: ALT 38; BUN 19; Creatinine, Ser 0.98; Hemoglobin 14.5; Platelets 284; Potassium 3.8; Sodium 142; TSH 3.170    Lipid Panel Lab Results  Component Value Date   CHOL 121 11/24/2018   HDL 39 (L) 11/24/2018   LDLCALC 55 11/24/2018   TRIG 133 11/24/2018      Wt Readings from Last 3 Encounters:  04/06/19 185 lb (83.9 kg)  11/24/18 181 lb (82.1 kg)  03/27/18 186 lb (84.4 kg)      ASSESSMENT AND PLAN:  Atherosclerosis of native coronary artery of native heart with stable angina pectoris ([place]) - Plan: EKG 12-Lead Currently with no symptoms of angina. No further workup at this time. Continue current medication regimen.  NSTEMI (non-ST elevated myocardial infarction) ([place]) - Plan:  EKG 12-Lead On Plavix daily Not on asa  Essential hypertension - Plan: EKG 12-Lead High but "whit coat" Will continue to monitor at home, reports it was 140 systolic recently  Mixed hyperlipidemia - Plan: EKG 12-Lead Recommended he stay on Crestor and Zetia Numbers at goal  Poorly controlled type 2 diabetes mellitus with circulatory disorder (HCC) - Hemoglobin A1c  7.3,  Continue walking, weight loss  Tobacco abuse -  Reports that he stop smoking   Total encounter time more than 25 minutes  Greater than 50% was spent in counseling and coordination of care with the patient  Disposition:   F/U  12 months   No orders of the defined types were placed in this encounter.    Signed, Dossie Arbourim Teodoro Jeffreys, M.D., Ph.D. 04/06/2019  [facility] [place] [phone]

## 2019-04-03 NOTE — Telephone Encounter (Signed)

## 2019-04-06 ENCOUNTER — Encounter: Payer: Self-pay | Admitting: Cardiovascular Disease

## 2019-04-06 ENCOUNTER — Telehealth: Payer: Self-pay | Admitting: Cardiovascular Disease

## 2019-04-06 ENCOUNTER — Ambulatory Visit (INDEPENDENT_AMBULATORY_CARE_PROVIDER_SITE_OTHER): Payer: BC Managed Care – PPO | Admitting: Cardiovascular Disease

## 2019-04-06 ENCOUNTER — Other Ambulatory Visit: Payer: Self-pay

## 2019-04-06 VITALS — BP 160/80 | HR 68 | Ht 66.0 in | Wt 185.0 lb

## 2019-04-06 DIAGNOSIS — E1159 Type 2 diabetes mellitus with other circulatory complications: Secondary | ICD-10-CM | POA: Diagnosis not present

## 2019-04-06 DIAGNOSIS — E782 Mixed hyperlipidemia: Secondary | ICD-10-CM | POA: Diagnosis not present

## 2019-04-06 DIAGNOSIS — I25118 Atherosclerotic heart disease of native coronary artery with other forms of angina pectoris: Secondary | ICD-10-CM

## 2019-04-06 DIAGNOSIS — I340 Nonrheumatic mitral (valve) insufficiency: Secondary | ICD-10-CM

## 2019-04-06 DIAGNOSIS — Z720 Tobacco use: Secondary | ICD-10-CM

## 2019-04-06 DIAGNOSIS — I10 Essential (primary) hypertension: Secondary | ICD-10-CM

## 2019-04-06 DIAGNOSIS — E1165 Type 2 diabetes mellitus with hyperglycemia: Secondary | ICD-10-CM

## 2019-04-06 NOTE — Telephone Encounter (Signed)
Patient has questions about his AVS after getting home today. There is information on the AVS that was not talked about or not regarding him that has patient confused. Would like to be called back.

## 2019-04-06 NOTE — Patient Instructions (Addendum)
Medication Instructions:   Ask pharmacy about price of amloidpine/valsartan/HCTZ combo pill exforge HCT  If you need a refill on your cardiac medications before your next appointment, please call your pharmacy.    Lab work: No new labs needed   If you have labs (blood work) drawn today and your tests are completely normal, you will receive your results only by: Marland Kitchen MyChart Message (if you have MyChart) OR . A paper copy in the mail If you have any lab test that is abnormal or we need to change your treatment, we will call you to review the results.   Testing/Procedures: No new testing needed   Follow-Up: At [facility], you and your health needs are our priority.  As part of our continuing mission to provide you with exceptional heart care, we have created designated Provider Care Teams.  These Care Teams include your primary Cardiologist (physician) and Advanced Practice Providers (APPs -  Physician Assistants and Nurse Practitioners) who all work together to provide you with the care you need, when you need it.  . You will need a follow up appointment in 12 months (July 2021) .   Please call our office 2 months in advance to schedule this appointment.  (May 2021)   . Providers on your designated Care Team:   . Murray Hodgkins, NP . Christell Faith, PA-C . Marrianne Mood, PA-C  Any Other Special Instructions Will Be Listed Below (If Applicable).  For educational health videos Log in to : [email or web address] Or : [email or web address], password : [email or web address]

## 2019-04-06 NOTE — Telephone Encounter (Signed)
Spoke with patient regarding mitral valve insuffiencey and tobacco abuse listed on his AVS. Advised him that the problem list is ongoing and cumulative and not necessary issues of concern at this time. Patient verbalized understanding.

## 2019-04-09 ENCOUNTER — Other Ambulatory Visit: Payer: Self-pay | Admitting: Cardiovascular Disease

## 2019-04-09 DIAGNOSIS — I10 Essential (primary) hypertension: Secondary | ICD-10-CM

## 2019-05-09 ENCOUNTER — Other Ambulatory Visit: Payer: Self-pay | Admitting: Cardiovascular Disease

## 2019-05-09 DIAGNOSIS — I2510 Atherosclerotic heart disease of native coronary artery without angina pectoris: Secondary | ICD-10-CM

## 2019-05-15 DIAGNOSIS — H43812 Vitreous degeneration, left eye: Secondary | ICD-10-CM | POA: Diagnosis not present

## 2019-05-22 ENCOUNTER — Other Ambulatory Visit: Payer: Self-pay

## 2019-05-22 DIAGNOSIS — I10 Essential (primary) hypertension: Secondary | ICD-10-CM

## 2019-05-22 MED ORDER — HYDROCHLOROTHIAZIDE 25 MG PO TABS: 25.0000 mg | ORAL_TABLET | Freq: Every day | ORAL | 3 refills | Status: DC

## 2019-05-27 ENCOUNTER — Other Ambulatory Visit: Payer: Self-pay | Admitting: Cardiovascular Disease

## 2019-06-12 DIAGNOSIS — H43812 Vitreous degeneration, left eye: Secondary | ICD-10-CM | POA: Diagnosis not present

## 2019-06-25 DIAGNOSIS — L578 Other skin changes due to chronic exposure to nonionizing radiation: Secondary | ICD-10-CM | POA: Diagnosis not present

## 2019-06-25 DIAGNOSIS — L570 Actinic keratosis: Secondary | ICD-10-CM | POA: Diagnosis not present

## 2019-06-25 DIAGNOSIS — Z86018 Personal history of other benign neoplasm: Secondary | ICD-10-CM | POA: Diagnosis not present

## 2019-07-20 ENCOUNTER — Emergency Department: Payer: BC Managed Care – PPO

## 2019-07-20 ENCOUNTER — Telehealth: Payer: Self-pay | Admitting: Cardiovascular Disease

## 2019-07-20 ENCOUNTER — Encounter: Payer: Self-pay | Admitting: Nurse Practitioner

## 2019-07-20 ENCOUNTER — Emergency Department
Admission: EM | Admit: 2019-07-20 | Discharge: 2019-07-20 | Disposition: A | Discharge status: Home / Self Care | Payer: BC Managed Care – PPO | Attending: Emergency Medicine | Admitting: Emergency Medicine

## 2019-07-20 ENCOUNTER — Other Ambulatory Visit: Payer: Self-pay

## 2019-07-20 DIAGNOSIS — R911 Solitary pulmonary nodule: Secondary | ICD-10-CM | POA: Insufficient documentation

## 2019-07-20 DIAGNOSIS — I2510 Atherosclerotic heart disease of native coronary artery without angina pectoris: Secondary | ICD-10-CM | POA: Insufficient documentation

## 2019-07-20 DIAGNOSIS — I4891 Unspecified atrial fibrillation: Secondary | ICD-10-CM | POA: Diagnosis not present

## 2019-07-20 DIAGNOSIS — I252 Old myocardial infarction: Secondary | ICD-10-CM | POA: Insufficient documentation

## 2019-07-20 DIAGNOSIS — Z87891 Personal history of nicotine dependence: Secondary | ICD-10-CM | POA: Diagnosis not present

## 2019-07-20 DIAGNOSIS — Z20828 Contact with and (suspected) exposure to other viral communicable diseases: Secondary | ICD-10-CM | POA: Diagnosis not present

## 2019-07-20 DIAGNOSIS — E119 Type 2 diabetes mellitus without complications: Secondary | ICD-10-CM | POA: Insufficient documentation

## 2019-07-20 DIAGNOSIS — R002 Palpitations: Secondary | ICD-10-CM | POA: Diagnosis not present

## 2019-07-20 DIAGNOSIS — R918 Other nonspecific abnormal finding of lung field: Secondary | ICD-10-CM | POA: Diagnosis not present

## 2019-07-20 DIAGNOSIS — I480 Paroxysmal atrial fibrillation: Secondary | ICD-10-CM | POA: Diagnosis not present

## 2019-07-20 DIAGNOSIS — R079 Chest pain, unspecified: Secondary | ICD-10-CM | POA: Diagnosis not present

## 2019-07-20 LAB — CBC
HCT: 45.4 % (ref 39.0–52.0)
Hemoglobin: 15.7 g/dL (ref 13.0–17.0)
MCH: 29.8 pg (ref 26.0–34.0)
MCHC: 34.6 g/dL (ref 30.0–36.0)
MCV: 86.3 fL (ref 80.0–100.0)
Platelets: 273 10*3/uL (ref 150–400)
RBC: 5.26 MIL/uL (ref 4.22–5.81)
RDW: 12.3 % (ref 11.5–15.5)
WBC: 7.1 10*3/uL (ref 4.0–10.5)
nRBC: 0 % (ref 0.0–0.2)

## 2019-07-20 LAB — BASIC METABOLIC PANEL
Anion gap: 14 (ref 5–15)
BUN: 24 mg/dL — ABNORMAL HIGH (ref 8–23)
CO2: 27 mmol/L (ref 22–32)
Calcium: 9.8 mg/dL (ref 8.9–10.3)
Chloride: 97 mmol/L — ABNORMAL LOW (ref 98–111)
Creatinine, Ser: 1.01 mg/dL (ref 0.61–1.24)
GFR calc Af Amer: >60 mL/min (ref >60)
GFR calc non Af Amer: >60 mL/min (ref >60)
Glucose, Bld: 258 mg/dL — ABNORMAL HIGH (ref 70–99)
Potassium: 3.5 mmol/L (ref 3.5–5.1)
Sodium: 138 mmol/L (ref 135–145)

## 2019-07-20 LAB — TROPONIN I (HIGH SENSITIVITY): Troponin I (High Sensitivity): 21 ng/L — ABNORMAL HIGH (ref <18)

## 2019-07-20 LAB — TSH: TSH: 2.889 u[IU]/mL (ref 0.350–4.500)

## 2019-07-20 MED ORDER — DILTIAZEM HCL ER COATED BEADS 120 MG PO CP24: 120.0000 mg | ORAL_CAPSULE | Freq: Every day | ORAL | 11 refills | Status: DC

## 2019-07-20 MED ORDER — SODIUM CHLORIDE 0.9 % IV SOLN
Freq: Once | INTRAVENOUS | Status: AC
  Administered 2019-07-20: 11:00:00 via INTRAVENOUS

## 2019-07-20 MED ORDER — DILTIAZEM HCL 25 MG/5ML IV SOLN
10.0000 mg | Freq: Once | INTRAVENOUS | Status: AC
  Administered 2019-07-20: 11:00:00 10 mg via INTRAVENOUS
  Filled 2019-07-20: qty 5

## 2019-07-20 MED ORDER — APIXABAN 5 MG PO TABS
5.0000 mg | ORAL_TABLET | Freq: Once | ORAL | Status: AC
  Administered 2019-07-20: 13:00:00 5 mg via ORAL
  Filled 2019-07-20: qty 1

## 2019-07-20 MED ORDER — IOHEXOL 300 MG/ML  SOLN
75.0000 mL | Freq: Once | INTRAMUSCULAR | Status: AC | PRN
  Administered 2019-07-20: 12:00:00 75 mL via INTRAVENOUS

## 2019-07-20 MED ORDER — APIXABAN 5 MG PO TABS: 5.0000 mg | ORAL_TABLET | Freq: Two times a day (BID) | ORAL | 1 refills | Status: DC

## 2019-07-20 MED ORDER — SODIUM CHLORIDE 0.9 % IV SOLN: Freq: Once | INTRAVENOUS | Status: DC

## 2019-07-20 MED ORDER — DILTIAZEM HCL ER COATED BEADS 120 MG PO CP24
120.0000 mg | ORAL_CAPSULE | Freq: Every day | ORAL | Status: DC
  Administered 2019-07-20: 13:00:00 120 mg via ORAL
  Filled 2019-07-20: qty 1

## 2019-07-20 NOTE — ED Provider Notes (Signed)
[facility] Emergency Department Provider Note       Time seen: ----------------------------------------- 10:28 AM on 07/20/2019 -----------------------------------------   I have reviewed the triage vital signs and the nursing notes.  HISTORY   Chief Complaint Palpitations    HPI Clinton Dorsey is a 68 y.o. male with a history of coronary artery disease, depression, hyperlipidemia, hypertension, hypokalemia, MI who presents to the ED for palpitations.  Patient states he got up around 830 this morning and his watch alerted him that he was in atrial fibrillation.  He denies any history of this, denies chest pain or shortness of breath.  He denies any recent illness.  Past Medical History:  Diagnosis Date  . Coronary artery disease    a. NSTEMI; b. 12/23/13 cath 11/2013: D1 80%, LCx 90% failed PCI, dRCA 99%, PL branch 99% s/p PCI/DES,  EF 55%; c. staged cardiac cath 12/2013: s/p PCI/DES to both D1 and mLCx, resdial LM 40%, ostial LCx 50%  . Depression   . Depression   . Diabetes mellitus (HCC)    a. poorly controlled  . Hyperlipidemia   . Hypertension   . Hypokalemia    a. recurrent  . Kidney stones   . MI (myocardial infarction) (HCC) 11/2013  . Mitral regurgitation    a. echo 11/2013: EF 55-60%, mild LVH, mildly dilated LA, mild TR, mild to mod MR, mildly elevated RVSP    . Obesity   . Tobacco abuse     Patient Active Problem List   Diagnosis Date Noted  . Recurrent major depressive disorder, in partial remission (HCC) 11/24/2018  . Advanced care planning/counseling discussion 05/27/2017  . Erectile dysfunction 07/10/2016  . Acute anxiety 12/15/2015  . Mitral regurgitation   . Tobacco abuse   . Depression   . Poorly controlled type 2 diabetes mellitus with circulatory disorder (HCC) 06/03/2015  . Angina pectoris (HCC)   . Hypokalemia   . Esophageal spasm   . Coronary artery disease involving native coronary artery of native heart with angina  pectoris with documented spasm (HCC)   . Hyperlipidemia   . NSTEMI (non-ST elevated myocardial infarction) (HCC) 12/14/2013  . Stented coronary artery - D1, mCx 12/14/2013  . Essential hypertension 12/14/2013  . Spinal stenosis of lumbar region without neurogenic claudication 10/24/2012    Past Surgical History:  Procedure Laterality Date  . CARDIAC CATHETERIZATION  12/08/2013  . CORONARY ANGIOPLASTY  12/08/2013   s/p stent placement  . HERNIA REPAIR    . LAMINECTOMY    . PERCUTANEOUS CORONARY STENT INTERVENTION (PCI-S) N/A 12/23/2013   Procedure: PERCUTANEOUS CORONARY STENT INTERVENTION (PCI-S);  Surgeon: [name], MD;  Location[facility] LAB;  Service: Cardiovascular;  Laterality: N/A;  . SPINE SURGERY  2014  . TONSILLECTOMY      Allergies Penicillin g, Atorvastatin, Penicillins, and Simvastatin  Social History Social History   Tobacco Use  . Smoking status: Former Smoker    Types: Cigars    Quit date: 12/08/2013    Years since quitting: 5.6  . Smokeless tobacco: Never Used  Substance Use Topics  . Alcohol use: No    Alcohol/week: 1.0 standard drinks    Types: 1 Glasses of wine per week    Comment: occasional  . Drug use: No   Review of Systems Constitutional: Negative for fever. Cardiovascular: Negative for chest pain.  Positive for palpitations Respiratory: Negative for shortness of breath. Gastrointestinal: Negative for abdominal pain, vomiting and diarrhea. Musculoskeletal: Negative for back pain. Skin: Negative  for rash. Neurological: Negative for headaches, focal weakness or numbness.  All systems negative/normal/unremarkable except as stated in the HPI  ____________________________________________   PHYSICAL EXAM:  VITAL SIGNS: ED Triage Vitals [07/20/19 1020]  Enc Vitals Group     BP (!) 159/123     Pulse Rate (!) 132     Resp 16     Temp 98.3 F (36.8 C)     Temp Source Oral     SpO2 97 %     Weight 180 lb (81.6 kg)     Height 5\' 6"   (1.676 m)     Head Circumference      Peak Flow      Pain Score 0     Pain Loc      Pain Edu?      Excl. in [place]?    Constitutional: Alert and oriented. Well appearing and in no distress. Eyes: Conjunctivae are normal. Normal extraocular movements. ENT      Head: Normocephalic and atraumatic.      Nose: No congestion/rhinnorhea.      Mouth/Throat: Mucous membranes are moist.      Neck: No stridor. Cardiovascular: Rapid rate, irregular rhythm. No murmurs, rubs, or gallops. Respiratory: Normal respiratory effort without tachypnea nor retractions. Breath sounds are clear and equal bilaterally. No wheezes/rales/rhonchi. Gastrointestinal: Soft and nontender. Normal bowel sounds Musculoskeletal: Nontender with normal range of motion in extremities. No lower extremity tenderness nor edema. Neurologic:  Normal speech and language. No gross focal neurologic deficits are appreciated.  Skin:  Skin is warm, dry and intact. No rash noted. Psychiatric: Mood and affect are normal. Speech and behavior are normal.  ____________________________________________  EKG: Interpreted by me.  Atrial fibrillation with a rapid ventricular response, rate is 132 bpm, normal axis, normal QT, possible septal infarct age-indeterminate  ____________________________________________  ED COURSE:  As part of my medical decision making, I reviewed the following data within the [place] History obtained from family if available, nursing notes, old chart and ekg, as well as notes from prior ED visits. Patient presented for palpitations and arrhythmia, we will assess with labs and imaging as indicated at this time.   Procedures  Clinton Dorsey was evaluated in Emergency Department on 07/20/2019 for the symptoms described in the history of present illness. He was evaluated in the context of the global COVID-19 pandemic, which necessitated consideration that the patient might be at risk for infection with  the SARS-CoV-2 virus that causes COVID-19. Institutional protocols and algorithms that pertain to the evaluation of patients at risk for COVID-19 are in a state of rapid change based on information released by regulatory bodies including the CDC and federal and state organizations. These policies and algorithms were followed during the patient's care in the ED.  ____________________________________________   LABS (pertinent positives/negatives)  Labs Reviewed  BASIC METABOLIC PANEL - Abnormal; Notable for the following components:      Result Value   Chloride 97 (*)    Glucose, Bld 258 (*)    BUN 24 (*)    All other components within normal limits  TROPONIN I (HIGH SENSITIVITY) - Abnormal; Notable for the following components:   Troponin I (High Sensitivity) 21 (*)    All other components within normal limits  SARS CORONAVIRUS 2 (TAT 6-24 HRS)  CBC  TSH    RADIOLOGY Images were viewed by me  Chest x-ray IMPRESSION:  1. Interval 7 mm nodular density at the right lung base. Further  evaluation  with a chest CT with contrast is recommended.  2. Interval cardiomegaly.  CT chest IMPRESSION: 1. The 7 mm nodular opacity noted at the right lung base on the current AP portable chest radiograph is not visualized on CT, presumed due to a nipple shadow. 2. There are several small nodules, 4 mm and smaller, as detailed above. No follow-up needed if patient is low-risk (and has no known or suspected primary neoplasm). Non-contrast chest CT can be considered in 12 months if patient is high-risk. This recommendation follows the consensus statement: Guidelines for Management of Incidental Pulmonary Nodules Detected on CT Images: From the Fleischner Society 2017; Radiology 2017; 284:228-243. 3. No acute findings.  No evidence of pneumonia or pulmonary edema. 4. Three-vessel coronary artery calcifications and aortic atherosclerosis.  Aortic Atherosclerosis  (ICD10-I70.0). ____________________________________________   DIFFERENTIAL DIAGNOSIS   Atrial fibrillation, arrhythmia, MI, PE, electrolyte abnormality  FINAL ASSESSMENT AND PLAN  Atrial fibrillation with rapid ventricular response, pulmonary nodule   Plan: The patient had presented for an arrhythmia. Patient's labs did reveal a slightly elevated troponin with a level of 21. Patient's imaging revealed a 7 mm nodular density in the right lung base which will need CT imaging.  I have discussed with cardiology Dr. Kirke Corin, we will hold Plavix and switch to Eliquis.  We will also hold amlodipine and switch to diltiazem.  I have discussed this with the patient.  Patient will be followed up with as an outpatient this week.   Ulice Dash, MD    Note: This note was generated in part or whole with voice recognition software. Voice recognition is usually quite accurate but there are transcription errors that can and very often do occur. I apologize for any typographical errors that were not detected and corrected.     Emily Filbert, MD 07/20/19 [phone]

## 2019-07-20 NOTE — Telephone Encounter (Signed)
Made call to pt for appt scheduling following ED visit for a fib RVR.  No answer, no VM. Sent [employer].

## 2019-07-20 NOTE — ED Notes (Signed)
X-ray at bedside

## 2019-07-20 NOTE — ED Notes (Signed)
Denies pain or SOB but states felt "fluttering" feeling.

## 2019-07-20 NOTE — ED Triage Notes (Signed)
Pt states when he got up around 830am this morning his apple watch alerted him that he was in a-fib, denies hx, denies pain or SOB. Pt is in NAD on arrival pt is noted to be in a-fib RVR in the 130's on the ECG.

## 2019-07-20 NOTE — ED Notes (Signed)
Patient transported to CT 

## 2019-07-20 NOTE — ED Notes (Signed)
ED Provider at bedside. 

## 2019-07-20 NOTE — Telephone Encounter (Signed)
STAT if HR is under 50 or over 120 (normal HR is 60-100 beats per minute)  1) What is your heart rate? Normal 55 - 60 recently it has been 65-120 fluctuating  117 right now   2) Do you have a log of your heart rate readings (document readings)?   3) Do you have any other symptoms? No   States that wife is having him go to the emergency room but will have phone.  Please call to discuss.

## 2019-07-20 NOTE — ED Notes (Signed)
Pt up top toilet to urinate without any distress noted. Pt back to bed and placed back on cardiac monitor. Family at bedside. No further needs at this time.

## 2019-07-21 ENCOUNTER — Telehealth: Payer: Self-pay | Admitting: Nurse Practitioner

## 2019-07-21 LAB — SARS CORONAVIRUS 2 (TAT 6-24 HRS): SARS Coronavirus 2: NEGATIVE

## 2019-07-21 NOTE — Telephone Encounter (Signed)
Copied from [place] [id number]. Topic: General - Other >> Jul 21, 2019  2:51 PM Pauline Good wrote: Reason for CRM: pt want to know why does he need to come in for an appt since he has an appt with his cardiologist >> Jul 21, 2019  4:16 PM Georgina Peer, CMA wrote: Does this patient need to come in for an appointment with [place]? He was sent a Pharmacist, community message by an RN saying that he needed to come in. Patient is scheduled to see Dr. Rockey Situ tomorrow.

## 2019-07-21 NOTE — Telephone Encounter (Signed)
Call to patient to make sure appt was made for clinic follow up post ED> he has appt tomorrow AM with Dr. Rockey Situ.

## 2019-07-21 NOTE — Progress Notes (Signed)
Cardiology Office Note  Date:  07/22/2019   ID:  Clinton Dorsey, DOB 09/11/1951, MRN [id number]  PCP:  Clinton Maple, MD   Chief Complaint  Patient presents with  . office visit    ED F/U-Atrial Fibrillation    HPI:  [name] is a very pleasant 68 year-old gentleman with a history of hyperlipidemia,  hypertension,  prediabetes,  prior occasional tobacco abuse , quit presented to [facility] 12/07/2013 with chest pain. non-ST elevation MI  Stents to LCX, diagonal, distal RCA (staged) Echo 10/2015: normal EF, RVSP 40, mod MR He presents today for follow-up of his coronary artery disease  Recently seen in the emergency room July 20, 2019 for palpitations Started 8:30 in the morning watch told him he was in atrial fibrillation EKG in the emergency room showing atrial fibrillation Plavix was held Changed to Eliquis 5 twice daily Amlodipine changed to diltiazem  Reports that he took a lorazepam last night for anxiety Converted overnight to normal sinus rhythm  No significant shortness of breath, no fluid retention no leg edema no chest pain Monitoring his heart rate and rhythm on his watch  Chronically low potassium Elevated BUN on review of lab work from emergency room  Labs reviewed Total cholesterol 121 LDL 55 Hemoglobin A1c 7.0  EKG personally reviewed by myself on todays visit Shows normal sinus rhythm with rate 65 bpm , old inferior MI  Previous Holter and stress test, Holter did show APCs, PVCs, idioventricular escape complexes, poor short run of SVT  Myoview stress test showing no ischemia  Other past medical history Cardiac catheterization was performed that showed severe three-vessel CAD. He had stent for distal RCA/ostial PL branch disease estimated at 99%. He also had mid circumflex with 99% disease, severe diagonal #1 disease. Attempt to place a stent to the mid circumflex was unsuccessful and given that he had significant dye and radiation exposure, it  was recommended that he have PCI of his left circumflex and diagonal vessel at a later date. Followup catheterization at [facility] with successful stents to his mid circumflex and first diagonal vessel   echocardiogram in the hospital showed ejection fraction 50-55%, mildly dilated left atrium, mild TR, low to moderate MR, no focal wall motion abnormalities noted   PMH:   has a past medical history of Arrhythmia, Coronary artery disease, Depression, Depression, Diabetes mellitus ([place]), Hyperlipidemia, Hypertension, Hypokalemia, Kidney stones, MI (myocardial infarction) ([place]) (11/2013), Mitral regurgitation, Obesity, and Tobacco abuse.  PSH:    Past Surgical History:  Procedure Laterality Date  . CARDIAC CATHETERIZATION  12/08/2013  . CORONARY ANGIOPLASTY  12/08/2013   s/p stent placement  . HERNIA REPAIR    . LAMINECTOMY    . PERCUTANEOUS CORONARY STENT INTERVENTION (PCI-S) N/A 12/23/2013   Procedure: PERCUTANEOUS CORONARY STENT INTERVENTION (PCI-S);  Surgeon: Wellington Hampshire, MD;  Location: [facility] CATH LAB;  Service: Cardiovascular;  Laterality: N/A;  . SPINE SURGERY  2014  . TONSILLECTOMY      Current Outpatient Medications  Medication Sig Dispense Refill  . apixaban (ELIQUIS) 5 MG TABS tablet Take 1 tablet (5 mg total) by mouth 2 (two) times daily. 60 tablet 1  . buPROPion (WELLBUTRIN XL) 150 MG 24 hr tablet Take 1 tablet (150 mg total) by mouth daily. 90 tablet 4  . carvedilol (COREG) 12.5 MG tablet Take 12.5 mg by mouth 2 (two) times daily with a meal.    . diltiazem (CARDIZEM CD) 120 MG 24 hr capsule Take 1 capsule (120 mg  total) by mouth daily. 30 capsule 11  . ezetimibe (ZETIA) 10 MG tablet TAKE 1 TABLET(10 MG) BY MOUTH DAILY 90 tablet 1  . Glucose Blood (BLOOD GLUCOSE TEST STRIPS) STRP 1 strip by In Vitro route daily. 100 each 12  . hydrochlorothiazide (HYDRODIURIL) 25 MG tablet Take 1 tablet (25 mg total) by mouth daily. 90 tablet 3  . LORazepam (ATIVAN) 1 MG tablet Take  0.5-1 tablets (0.5-1 mg total) by mouth 2 (two) times daily as needed for anxiety. 30 tablet 0  . losartan (COZAAR) 100 MG tablet TAKE 1 TABLET(100 MG) BY MOUTH DAILY 90 tablet 1  . magnesium oxide (MAG-OX) 400 MG tablet Take 400 mg by mouth daily.    . metFORMIN (GLUCOPHAGE) 500 MG tablet TAKE 1 TABLET BY MOUTH ONCE DAILY (Patient taking differently: Take 500 mg by mouth daily with breakfast. ) 90 tablet 2  . Multiple Vitamin (MULTIVITAMIN) capsule Take 1 capsule by mouth daily.    . potassium chloride SA (K-DUR,KLOR-CON) 20 MEQ tablet Take 1 tablet (20 mEq total) by mouth daily. 90 tablet 4  . rosuvastatin (CRESTOR) 20 MG tablet TAKE 1 TABLET(20 MG) BY MOUTH DAILY 90 tablet 3  . sildenafil (REVATIO) 20 MG tablet TAKE ONE TO FIVE TABLETS BY MOUTH DAILY AS NEEDED 90 tablet 0   No current facility-administered medications for this visit.      Allergies:   Penicillin g, Atorvastatin, Penicillins, and Simvastatin   Social History:  The patient  reports that he quit smoking about 5 years ago. His smoking use included cigars. He has never used smokeless tobacco. He reports that he does not drink alcohol or use drugs.   Family History:   family history includes Heart attack (age of onset: 36) in his father; Heart disease in his father; Hypertension in his mother and sister.    Review of Systems: Review of Systems  Constitutional: Negative.   HENT: Negative.   Respiratory: Negative.   Cardiovascular: Negative.   Gastrointestinal: Negative.   Musculoskeletal: Negative.   Neurological: Negative.   Psychiatric/Behavioral: Negative.   All other systems reviewed and are negative.   PHYSICAL EXAM: VS:  BP 132/82 (BP Location: Left Arm, Patient Position: Sitting, Cuff Size: Normal)   Pulse 65   Temp (!) 97 F (36.1 C)   Ht 5\' 6"  (1.676 m)   Wt 183 lb 4 oz (83.1 kg)   SpO2 95%   BMI 29.58 kg/m  , BMI Body mass index is 29.58 kg/m. Constitutional:  oriented to person, place, and time. No  distress.  HENT:  Head: Grossly normal Eyes:  no discharge. No scleral icterus.  Neck: No JVD, no carotid bruits  Cardiovascular: Regular rate and rhythm, no murmurs appreciated Pulmonary/Chest: Clear to auscultation bilaterally, no wheezes or rails Abdominal: Soft.  no distension.  no tenderness.  Musculoskeletal: Normal range of motion Neurological:  normal muscle tone. Coordination normal. No atrophy Skin: Skin warm and dry Psychiatric: normal affect, pleasant   Recent Labs: 11/24/2018: ALT 38 07/20/2019: BUN 24; Creatinine, Ser 1.01; Hemoglobin 15.7; Platelets 273; Potassium 3.5; Sodium 138; TSH 2.889    Lipid Panel Lab Results  Component Value Date   CHOL 121 11/24/2018   HDL 39 (L) 11/24/2018   LDLCALC 55 11/24/2018   TRIG 133 11/24/2018      Wt Readings from Last 3 Encounters:  07/22/19 183 lb 4 oz (83.1 kg)  07/20/19 180 lb (81.6 kg)  04/06/19 185 lb (83.9 kg)      ASSESSMENT  AND PLAN:  Atherosclerosis of native coronary artery of native heart with stable angina pectoris (HCC) - Plan: EKG 12-Lead No anginal symptoms, no changes to his medications Plavix held, stay on low-dose aspirin  NSTEMI (non-ST elevated myocardial infarction) (HCC) - On aspirin, no anginal symptoms  Essential hypertension - Plan: EKG 12-Lead Blood pressure stable on today's visit No changes made  Paroxysmal atrial fibrillation Long discussion with him concerning mechanism, management, anticoagulation, risk of stroke He is only taking Eliquis 5 mg once a day by mistake Recommended he increase Eliquis up to 5 twice a day Stay on low-dose aspirin given 3 stents and still with significant stenoses Stay on carvedilol with diltiazem 120 daily, refills given For paroxysmal episodes recommended he take extra carvedilol Discussed cardioversion in the emergency room as needed Also discussed the atrial fibrillation clinic in [place]  Mixed hyperlipidemia - Plan: EKG 12-Lead Stay on  Crestor Zetia, goal LDL less than 70  Poorly controlled type 2 diabetes mellitus with circulatory disorder (HCC) - Hemoglobin A1c  7.3,  Continue walking, weight loss Again discussed with him  Tobacco abuse -  Reports that he stop smoking Stressed importance of smoking cessation   Total encounter time more than 45 minutes  Greater than 50% was spent in counseling and coordination of care with the patient  Disposition:   F/U 3 months   No orders of the defined types were placed in this encounter.    Signed, Dossie Arbour, M.D., Ph.D. 07/22/2019  [facility] [place], [place] [phone]

## 2019-07-22 ENCOUNTER — Ambulatory Visit (INDEPENDENT_AMBULATORY_CARE_PROVIDER_SITE_OTHER): Payer: BC Managed Care – PPO | Admitting: Cardiovascular Disease

## 2019-07-22 ENCOUNTER — Encounter: Payer: Self-pay | Admitting: Cardiovascular Disease

## 2019-07-22 ENCOUNTER — Other Ambulatory Visit: Payer: Self-pay

## 2019-07-22 VITALS — BP 132/82 | HR 65 | Temp 97.0°F | Ht 66.0 in | Wt 183.2 lb

## 2019-07-22 DIAGNOSIS — E1165 Type 2 diabetes mellitus with hyperglycemia: Secondary | ICD-10-CM

## 2019-07-22 DIAGNOSIS — I10 Essential (primary) hypertension: Secondary | ICD-10-CM | POA: Diagnosis not present

## 2019-07-22 DIAGNOSIS — I25118 Atherosclerotic heart disease of native coronary artery with other forms of angina pectoris: Secondary | ICD-10-CM

## 2019-07-22 DIAGNOSIS — I480 Paroxysmal atrial fibrillation: Secondary | ICD-10-CM

## 2019-07-22 DIAGNOSIS — Z720 Tobacco use: Secondary | ICD-10-CM | POA: Diagnosis not present

## 2019-07-22 DIAGNOSIS — E782 Mixed hyperlipidemia: Secondary | ICD-10-CM

## 2019-07-22 DIAGNOSIS — E1159 Type 2 diabetes mellitus with other circulatory complications: Secondary | ICD-10-CM | POA: Diagnosis not present

## 2019-07-22 MED ORDER — APIXABAN 5 MG PO TABS: 5.0000 mg | ORAL_TABLET | Freq: Two times a day (BID) | ORAL | 2 refills | Status: DC

## 2019-07-22 MED ORDER — DILTIAZEM HCL ER COATED BEADS 120 MG PO CP24: 120.0000 mg | ORAL_CAPSULE | Freq: Every day | ORAL | 2 refills | Status: DC

## 2019-07-22 MED ORDER — POTASSIUM CHLORIDE CRYS ER 20 MEQ PO TBCR: 20.0000 meq | EXTENDED_RELEASE_TABLET | Freq: Every day | ORAL | 4 refills | Status: DC

## 2019-07-22 NOTE — Telephone Encounter (Signed)
Called and left patient a detailed message letting him know what Jolene said. Asked for patient to please call back to schedule.

## 2019-07-22 NOTE — Patient Instructions (Addendum)
Medication Instructions:   Eliquis 5 mg twice a day  Hold the HCTZ Stay on potassium for 3 days then stop  Refill diltiazem and eliquis  If you go into atrial fib Try extra coreg  If you need a refill on your cardiac medications before your next appointment, please call your pharmacy.    Lab work: No new labs needed   If you have labs (blood work) drawn today and your tests are completely normal, you will receive your results only by: Marland Kitchen MyChart Message (if you have MyChart) OR . A paper copy in the mail If you have any lab test that is abnormal or we need to change your treatment, we will call you to review the results.   Testing/Procedures: No new testing needed   Follow-Up: At [facility], you and your health needs are our priority.  As part of our continuing mission to provide you with exceptional heart care, we have created designated Provider Care Teams.  These Care Teams include your primary Cardiologist (physician) and Advanced Practice Providers (APPs -  Physician Assistants and Nurse Practitioners) who all work together to provide you with the care you need, when you need it.  . You will need a follow up appointment in 3 month   . Providers on your designated Care Team:   . Murray Hodgkins, NP . Christell Faith, PA-C . Marrianne Mood, PA-C  Any Other Special Instructions Will Be Listed Below (If Applicable).  For educational health videos Log in to : [email or web address] Or : [email or web address], password : [email or web address]

## 2019-07-23 NOTE — Telephone Encounter (Signed)
Pt stated he did not want a f/u since he has just seen cardiologist and had medications adjusted .Pt stated he will call back when he needs an appointment.Pt stated he is unsure of who he would like as a PCP at the moment.

## 2019-07-29 NOTE — Telephone Encounter (Signed)
Pt.states he is looking for PCP else where and understood message from provider about A1c.

## 2019-10-06 ENCOUNTER — Other Ambulatory Visit: Payer: Self-pay | Admitting: Cardiovascular Disease

## 2019-10-06 ENCOUNTER — Other Ambulatory Visit: Payer: Self-pay | Admitting: *Deleted

## 2019-10-06 MED ORDER — CARVEDILOL 12.5 MG PO TABS: 12.5000 mg | ORAL_TABLET | Freq: Two times a day (BID) | ORAL | 0 refills | Status: DC

## 2019-10-19 DIAGNOSIS — E119 Type 2 diabetes mellitus without complications: Secondary | ICD-10-CM | POA: Diagnosis not present

## 2019-10-26 ENCOUNTER — Ambulatory Visit: Payer: BC Managed Care – PPO | Admitting: Cardiovascular Disease

## 2019-11-09 ENCOUNTER — Ambulatory Visit: Payer: BC Managed Care – PPO

## 2019-11-16 DIAGNOSIS — E119 Type 2 diabetes mellitus without complications: Secondary | ICD-10-CM | POA: Diagnosis not present

## 2019-12-01 ENCOUNTER — Ambulatory Visit: Payer: BC Managed Care – PPO | Admitting: Cardiovascular Disease

## 2019-12-07 ENCOUNTER — Other Ambulatory Visit: Payer: Self-pay

## 2019-12-07 NOTE — Telephone Encounter (Signed)
Called and spoke with patient. Message relayed. Patient stated that he will call back to schedule a follow up. FYI

## 2019-12-07 NOTE — Telephone Encounter (Signed)
Yes, needs appointment for further refills.  He should have about 3 months left according to last fill date.  If he does not, we can make sure he has enough to make it to his next appointment with [place].

## 2019-12-07 NOTE — Telephone Encounter (Signed)
Refill request for Bupropion LOV: 11/24/2018 Next Appt: none  Patient not seen in over a year. Send letter for patient to [place]/d appt?

## 2019-12-14 ENCOUNTER — Other Ambulatory Visit: Payer: Self-pay

## 2019-12-14 MED ORDER — BUPROPION HCL ER (XL) 150 MG PO TB24: 150.0000 mg | ORAL_TABLET | Freq: Every day | ORAL | 0 refills | Status: AC

## 2019-12-14 NOTE — Telephone Encounter (Signed)
LOV: 11/24/2018 with Vonita Moss, MD.

## 2019-12-15 ENCOUNTER — Other Ambulatory Visit: Payer: Self-pay | Admitting: Cardiovascular Disease

## 2019-12-17 DIAGNOSIS — E119 Type 2 diabetes mellitus without complications: Secondary | ICD-10-CM | POA: Diagnosis not present

## 2020-01-04 ENCOUNTER — Other Ambulatory Visit: Payer: Self-pay

## 2020-01-04 ENCOUNTER — Other Ambulatory Visit: Payer: Self-pay | Admitting: Cardiovascular Disease

## 2020-01-04 MED ORDER — CARVEDILOL 12.5 MG PO TABS: 12.5000 mg | ORAL_TABLET | Freq: Two times a day (BID) | ORAL | 0 refills | Status: DC

## 2020-01-07 DIAGNOSIS — I480 Paroxysmal atrial fibrillation: Secondary | ICD-10-CM | POA: Diagnosis not present

## 2020-01-07 DIAGNOSIS — Z125 Encounter for screening for malignant neoplasm of prostate: Secondary | ICD-10-CM | POA: Diagnosis not present

## 2020-01-07 DIAGNOSIS — Z0000 Encounter for general adult medical examination without abnormal findings: Secondary | ICD-10-CM | POA: Diagnosis not present

## 2020-01-07 DIAGNOSIS — E1159 Type 2 diabetes mellitus with other circulatory complications: Secondary | ICD-10-CM | POA: Diagnosis not present

## 2020-01-07 DIAGNOSIS — I2510 Atherosclerotic heart disease of native coronary artery without angina pectoris: Secondary | ICD-10-CM | POA: Diagnosis not present

## 2020-01-09 NOTE — Progress Notes (Signed)
Cardiology Office Note  Date:  01/12/2020   ID:  Clinton Dorsey, DOB [birth year], MRN [id number]  PCP:  Guadalupe Maple, MD   Chief Complaint  Patient presents with  . other    3 month follow up. Meds reviewed by the pt. verbally. "doing well."     HPI:  [name] is a very pleasant 69 year-old gentleman with a history of hyperlipidemia,  hypertension,  diabetes, HBA1C 8.5 prior occasional tobacco abuse , quit presented to [facility] 12/07/2013 with chest pain. non-ST elevation MI  Stents to LCX, diagonal, distal RCA (staged) Echo 10/2015: normal EF, RVSP 40, mod MR PAF anxiety He presents today for follow-up of his coronary artery disease, atrial fib  Some problems with anxiety coming in today BP elevated today 361 systolic  Even elevated around that level on my recheck Reports that he took his regular medications this morning  In the past has taken lorazepam for anxiety  Walking 10K steps a day, light weights.  No anginal symptoms Weight down 4 pounds  Chronically low potassium, 3.5 recently Reports loose bowel movements on Metformin Currently not taking potassium supplement  Eating bananas Lab work reviewed with him HBA1C 8.5 Total chol 138, LDL 72  EKG personally reviewed by myself on todays visit Shows normal sinus rhythm rate 80 bpm unable to exclude old anterior MI, left axis deviation  Other past medical history reviewed Recently seen in the emergency room July 20, 2019 for palpitations Started 8:30 in the morning watch told him he was in atrial fibrillation EKG in the emergency room showing atrial fibrillation Plavix was held Changed to Eliquis 5 twice daily Amlodipine changed to diltiazem Converted overnight to normal sinus rhythm  Previous Holter and stress test, Holter did show APCs, PVCs, idioventricular escape complexes, poor short run of SVT  Myoview stress test showing no ischemia  Cardiac catheterization was performed that showed severe  three-vessel CAD. He had stent for distal RCA/ostial PL branch disease estimated at 99%. He also had mid circumflex with 99% disease, severe diagonal #1 disease. Attempt to place a stent to the mid circumflex was unsuccessful and given that he had significant dye and radiation exposure, it was recommended that he have PCI of his left circumflex and diagonal vessel at a later date. Followup catheterization at [facility] with successful stents to his mid circumflex and first diagonal vessel   echocardiogram in the hospital showed ejection fraction 50-55%, mildly dilated left atrium, mild TR, low to moderate MR, no focal wall motion abnormalities noted   PMH:   has a past medical history of Arrhythmia, Coronary artery disease, Depression, Depression, Diabetes mellitus ([place]), Hyperlipidemia, Hypertension, Hypokalemia, Kidney stones, MI (myocardial infarction) ([place]) (11/2013), Mitral regurgitation, Obesity, and Tobacco abuse.  PSH:    Past Surgical History:  Procedure Laterality Date  . CARDIAC CATHETERIZATION  12/08/2013  . CORONARY ANGIOPLASTY  12/08/2013   s/p stent placement  . HERNIA REPAIR    . LAMINECTOMY    . PERCUTANEOUS CORONARY STENT INTERVENTION (PCI-S) N/A 12/23/2013   Procedure: PERCUTANEOUS CORONARY STENT INTERVENTION (PCI-S);  Surgeon: Wellington Hampshire, MD;  Location: [facility] CATH LAB;  Service: Cardiovascular;  Laterality: N/A;  . SPINE SURGERY  2014  . TONSILLECTOMY      Current Outpatient Medications  Medication Sig Dispense Refill  . apixaban (ELIQUIS) 5 MG TABS tablet Take 1 tablet (5 mg total) by mouth 2 (two) times daily. 180 tablet 2  . buPROPion (WELLBUTRIN XL) 150 MG 24 hr tablet Take  1 tablet (150 mg total) by mouth daily. 30 tablet 0  . carvedilol (COREG) 12.5 MG tablet Take 1 tablet (12.5 mg total) by mouth 2 (two) times daily with a meal. 180 tablet 0  . Coenzyme Q10 10 MG capsule Take 10 mg by mouth daily.     Marland Kitchen diltiazem (CARDIZEM CD) 120 MG 24 hr capsule Take 1  capsule (120 mg total) by mouth daily. 90 capsule 2  . ezetimibe (ZETIA) 10 MG tablet TAKE 1 TABLET(10 MG) BY MOUTH DAILY 90 tablet 0  . Glucose Blood (BLOOD GLUCOSE TEST STRIPS) STRP 1 strip by In Vitro route daily. 100 each 12  . LORazepam (ATIVAN) 1 MG tablet Take 0.5-1 tablets (0.5-1 mg total) by mouth 2 (two) times daily as needed for anxiety. 30 tablet 0  . losartan (COZAAR) 100 MG tablet TAKE 1 TABLET(100 MG) BY MOUTH DAILY 90 tablet 0  . magnesium oxide (MAG-OX) 400 MG tablet Take 400 mg by mouth daily.    . metFORMIN (GLUCOPHAGE) 500 MG tablet Take 500 mg by mouth 2 (two) times daily with a meal.     . Multiple Vitamin (MULTIVITAMIN) capsule Take 1 capsule by mouth daily.    . rosuvastatin (CRESTOR) 20 MG tablet TAKE 1 TABLET(20 MG) BY MOUTH DAILY 90 tablet 3  . sildenafil (REVATIO) 20 MG tablet TAKE 1-5 TABLETS BY MOUTH DAILY AS NEEDED 90 tablet 0  . potassium chloride SA (KLOR-CON) 20 MEQ tablet Take 1 tablet (20 mEq total) by mouth daily. 90 tablet 3   No current facility-administered medications for this visit.     Allergies:   Penicillin g, Atorvastatin, Penicillins, and Simvastatin   Social History:  The patient  reports that he quit smoking about 6 years ago. His smoking use included cigars. He has never used smokeless tobacco. He reports that he does not drink alcohol or use drugs.   Family History:   family history includes Heart attack (age of onset: 40) in his father; Heart disease in his father; Hypertension in his mother and sister.    Review of Systems: Review of Systems  Constitutional: Negative.   HENT: Negative.   Respiratory: Negative.   Cardiovascular: Negative.   Gastrointestinal: Negative.   Musculoskeletal: Negative.   Neurological: Negative.   Psychiatric/Behavioral: Negative.   All other systems reviewed and are negative.   PHYSICAL EXAM: VS:  BP (!) 190/118 (BP Location: Left Arm, Patient Position: Sitting, Cuff Size: Normal)   Pulse 80   Ht 5'  5.5" (1.664 m)   Wt 179 lb 2 oz (81.3 kg)   SpO2 98%   BMI 29.35 kg/m  , BMI Body mass index is 29.35 kg/m. Constitutional:  oriented to person, place, and time. No distress.  HENT:  Head: Grossly normal Eyes:  no discharge. No scleral icterus.  Neck: No JVD, no carotid bruits  Cardiovascular: Regular rate and rhythm, no murmurs appreciated Pulmonary/Chest: Clear to auscultation bilaterally, no wheezes or rails Abdominal: Soft.  no distension.  no tenderness.  Musculoskeletal: Normal range of motion Neurological:  normal muscle tone. Coordination normal. No atrophy Skin: Skin warm and dry Psychiatric: normal affect, pleasant  Recent Labs: 07/20/2019: BUN 24; Creatinine, Ser 1.01; Hemoglobin 15.7; Platelets 273; Potassium 3.5; Sodium 138; TSH 2.889    Lipid Panel Lab Results  Component Value Date   CHOL 121 11/24/2018   HDL 39 (L) 11/24/2018   LDLCALC 55 11/24/2018   TRIG 133 11/24/2018      Wt Readings from Last 3  Encounters:  01/12/20 179 lb 2 oz (81.3 kg)  07/22/19 183 lb 4 oz (83.1 kg)  07/20/19 180 lb (81.6 kg)      ASSESSMENT AND PLAN:  Atherosclerosis of native coronary artery of native heart with stable angina pectoris (HCC) - Plan: EKG 12-Lead Currently with no symptoms of angina. No further workup at this time. Continue current medication regimen.  NSTEMI (non-ST elevated myocardial infarction) (HCC) - On aspirin, no anginal symptoms Stable, recommend a walking program  Essential hypertension - Plan: EKG 12-Lead Markedly elevated blood pressure today, suspect component of anxiety He does not want a blood pressure pill to be given in the office, prefers to recheck it at home  Recommend he call us with some blood pressures this afternoon, can take extra carvedilol or diltiazem Also recommended he check extra pressures at home over the next week or 2 for further medication adjustment if needed  Paroxysmal atrial fibrillation Eliquis up to 5 twice a day On  carvedilol and diltiazem maintaining normal sinus rhythm  Mixed hyperlipidemia - Plan: EKG 12-Lead Stay on Crestor Zetia,  Cholesterol at goal  Poorly controlled type 2 diabetes mellitus with circulatory disorder (HCC) - Hemoglobin A1c 8.5 Recommend to continue walking, diet program  Tobacco abuse -  Reports that he stop smoking Stressed importance of smoking cessation   Total encounter time more than 25 minutes  Greater than 50% was spent in counseling and coordination of care with the patient  Disposition:   F/U 6 months   Orders Placed This Encounter  Procedures  . EKG 12-Lead     Signed, Dossie Arbour, M.D., Ph.D. 01/12/2020  [facility] [place], [place] [phone]

## 2020-01-12 ENCOUNTER — Other Ambulatory Visit: Payer: Self-pay

## 2020-01-12 ENCOUNTER — Encounter: Payer: Self-pay | Admitting: Cardiovascular Disease

## 2020-01-12 ENCOUNTER — Telehealth: Payer: Self-pay | Admitting: Cardiovascular Disease

## 2020-01-12 ENCOUNTER — Ambulatory Visit (INDEPENDENT_AMBULATORY_CARE_PROVIDER_SITE_OTHER): Payer: BC Managed Care – PPO | Admitting: Cardiovascular Disease

## 2020-01-12 VITALS — BP 190/118 | HR 80 | Ht 65.5 in | Wt 179.1 lb

## 2020-01-12 DIAGNOSIS — Z720 Tobacco use: Secondary | ICD-10-CM

## 2020-01-12 DIAGNOSIS — I25118 Atherosclerotic heart disease of native coronary artery with other forms of angina pectoris: Secondary | ICD-10-CM

## 2020-01-12 DIAGNOSIS — I10 Essential (primary) hypertension: Secondary | ICD-10-CM | POA: Diagnosis not present

## 2020-01-12 DIAGNOSIS — E1159 Type 2 diabetes mellitus with other circulatory complications: Secondary | ICD-10-CM

## 2020-01-12 DIAGNOSIS — I340 Nonrheumatic mitral (valve) insufficiency: Secondary | ICD-10-CM

## 2020-01-12 DIAGNOSIS — I480 Paroxysmal atrial fibrillation: Secondary | ICD-10-CM | POA: Diagnosis not present

## 2020-01-12 DIAGNOSIS — E782 Mixed hyperlipidemia: Secondary | ICD-10-CM

## 2020-01-12 DIAGNOSIS — E1165 Type 2 diabetes mellitus with hyperglycemia: Secondary | ICD-10-CM

## 2020-01-12 MED ORDER — POTASSIUM CHLORIDE CRYS ER 20 MEQ PO TBCR: 20.0000 meq | EXTENDED_RELEASE_TABLET | Freq: Every day | ORAL | 3 refills | Status: DC

## 2020-01-12 NOTE — Telephone Encounter (Signed)
Pt c/o BP issue: STAT if pt c/o blurred vision, one-sided weakness or slurred speech  1. What are your last 5 BP readings? 173/109  2. Are you having any other symptoms (ex. Dizziness, headache, blurred vision, passed out)? Headache (believes it may be sinus) also patient had no caffeine today   3. What is your BP issue? Still running high after taking extra carvedilol (12.5 mg), two hours after medication BP was 173/109

## 2020-01-12 NOTE — Patient Instructions (Addendum)
Pressure elevated today Monitor at home Call us with numbers  Please check 2 hours after medications   Medication Instructions:  Potassium 20 mEq once a day  If you need a refill on your cardiac medications before your next appointment, please call your pharmacy.    Lab work: No new labs needed   If you have labs (blood work) drawn today and your tests are completely normal, you will receive your results only by: Marland Kitchen MyChart Message (if you have MyChart) OR . A paper copy in the mail If you have any lab test that is abnormal or we need to change your treatment, we will call you to review the results.   Testing/Procedures: No new testing needed   Follow-Up: At [facility], you and your health needs are our priority.  As part of our continuing mission to provide you with exceptional heart care, we have created designated Provider Care Teams.  These Care Teams include your primary Cardiologist (physician) and Advanced Practice Providers (APPs -  Physician Assistants and Nurse Practitioners) who all work together to provide you with the care you need, when you need it.  . You will need a follow up appointment in 6 months   . Providers on your designated Care Team:   . Nicolasa Ducking, NP . Eula Listen, PA-C . Marisue Ivan, PA-C  Any Other Special Instructions Will Be Listed Below (If Applicable).  For educational health videos Log in to : [email or web address] Or : [email or web address], password : [email or web address]   How to Take Your Blood Pressure You can take your blood pressure at home with a machine. You may need to check your blood pressure at home:  To check if you have high blood pressure (hypertension).  To check your blood pressure over time.  To make sure your blood pressure medicine is working. Supplies needed: You will need a blood pressure machine, or monitor. You can buy one at a drugstore or online. When choosing one:  Choose one with an arm cuff.  Choose one that  wraps around your upper arm. Only one finger should fit between your arm and the cuff.  Do not choose one that measures your blood pressure from your wrist or finger. Your doctor can suggest a monitor. How to prepare Avoid these things for 30 minutes before checking your blood pressure:  Drinking caffeine.  Drinking alcohol.  Eating.  Smoking.  Exercising. Five minutes before checking your blood pressure:  Pee.  Sit in a dining chair. Avoid sitting in a soft couch or armchair.  Be quiet. Do not talk. How to take your blood pressure Follow the instructions that came with your machine. If you have a digital blood pressure monitor, these may be the instructions: 1. Sit up straight. 2. Place your feet on the floor. Do not cross your ankles or legs. 3. Rest your left arm at the level of your heart. You may rest it on a table, desk, or chair. 4. Pull up your shirt sleeve. 5. Wrap the blood pressure cuff around the upper part of your left arm. The cuff should be 1 inch (2.5 cm) above your elbow. It is best to wrap the cuff around bare skin. 6. Fit the cuff snugly around your arm. You should be able to place only one finger between the cuff and your arm. 7. Put the cord inside the groove of your elbow. 8. Press the power button. 9. Sit quietly while the cuff fills with air and loses air. 10.  Write down the numbers on the screen. 11. Wait 2-3 minutes and then repeat steps 1-10. What do the numbers mean? Two numbers make up your blood pressure. The first number is called systolic pressure. The second is called diastolic pressure. An example of a blood pressure reading is "120 over 80" (or 120/80). If you are an adult and do not have a medical condition, use this guide to find out if your blood pressure is normal: Normal  First number: below 120.  Second number: below 80. Elevated  First number: 120-129.  Second number: below 80. Hypertension stage 1  First number:  130-139.  Second number: 80-89. Hypertension stage 2  First number: 140 or above.  Second number: 28 or above. Your blood pressure is above normal even if only the top or bottom number is above normal. Follow these instructions at home:  Check your blood pressure as often as your doctor tells you to.  Take your monitor to your next doctor's appointment. Your doctor will: ? Make sure you are using it correctly. ? Make sure it is working right.  Make sure you understand what your blood pressure numbers should be.  Tell your doctor if your medicines are causing side effects. Contact a doctor if:  Your blood pressure keeps being high. Get help right away if:  Your first blood pressure number is higher than 180.  Your second blood pressure number is higher than 120. This information is not intended to replace advice given to you by your health care provider. Make sure you discuss any questions you have with your health care provider. Document Revised: 08/16/2017 Document Reviewed: 02/10/2016 Elsevier Patient Education  2020 [employer].

## 2020-01-12 NOTE — Telephone Encounter (Signed)
Patient called back to update on blood pressures 160/100 and then took carvedilol 3.125 mg and had to drive to [place] and back and it was 173/101. He will monitor through the night and call back tomorrow with updates.

## 2020-01-13 MED ORDER — DILTIAZEM HCL ER COATED BEADS 120 MG PO CP24: 120.0000 mg | ORAL_CAPSULE | Freq: Two times a day (BID) | ORAL | 2 refills | Status: DC

## 2020-01-13 NOTE — Telephone Encounter (Signed)
Patient calling back stating he took an extra carvedilol 12.5 mg which brought it down to 140/85.  Patient is wanting to know if he should continue to take diltiazem tonight

## 2020-01-13 NOTE — Telephone Encounter (Signed)
Patient calling back with BP still running high, 178/103 while resting  Please advise

## 2020-01-13 NOTE — Telephone Encounter (Signed)
Spoke with patient to review additional recommendations to take medication for his headache to see if this will help as well. During previous call we discussed increase of Diltiazem 120 mg twice a day as well. He verbalized understanding of further instructions with no further questions at this time.

## 2020-01-13 NOTE — Telephone Encounter (Signed)
Spoke with patient and confirmed his medications and that I was glad to hear that it lowered for him. He just wanted to make [place] aware and had no further questions at this time.

## 2020-01-13 NOTE — Telephone Encounter (Signed)
Spoke with patients wife per release form and she reported his blood pressures today were 178/101 and 178/103. Sent provider a secure chat while speaking with her and he replied with these orders:   would make sure he is taking all his med there are 3. then would increase diltiazem up to 120 BID, if it works to bring it down, we could change later to 240 daily.  She reports that patient has had a headache the past 3 days and he may not have mentioned that to Dr. Mariah Milling. Reviewed medication instructions with both him and her and for him to take extra carvedilol as directed by Dr. Mariah Milling at his visit yesterday. Also instructed him to continue monitoring his blood pressure readings and to call us if his blood pressures do not improve. He verbalized understanding of our conversation agreement with plan and had no further questions at this time.

## 2020-01-13 NOTE — Telephone Encounter (Signed)
Headache can push his blood pressure up Would try some Tylenol or Aleve for his headache If needed could increase diltiazem up to 240 mg daily/or 120 twice daily

## 2020-01-14 NOTE — Telephone Encounter (Signed)
Spoke to patient about BP update. He has been taking diltiazem BID, Losartan q daily in am and coreg BID. BP today was 154/85, HR 55. He denies any sx, reports feeling well.   I advised him to continue monitoring for a few days and call or send mychart message on Monday unless he has issues prior to that.   Advised pt to call for any further questions or concerns.

## 2020-01-14 NOTE — Telephone Encounter (Signed)
Patient calling in stating after he doubled medication last night, BP was still high when woke up (170/100). After patient took a walk and rested it was 144/85 and took losartan. Patient wanting to be advised if he should continue to double medication  Please advise

## 2020-01-15 NOTE — Telephone Encounter (Signed)
After seeing that patient had not viewed mychart today, I called him to speak about POC.   He reported after downloading and using calm app BP was improved to 164/96. He will continue to use calming techniques and take medications as ordered. Will continue to monitor VS. Follow up as planned.   Pt reports that he has a lot of anxiety surrounding fathers death of heart disease at 69 years old. Pt worries as he is turning 69 this year.   Advised pt to call for any further questions or concerns.

## 2020-01-15 NOTE — Telephone Encounter (Addendum)
Update: this morning BP 175/105 2 hours after medications. Took medications at 0830.  Now 189/121, HR 67.  No headache, no blurry vision, no chest pain.   Pt very anxious, the more he has inc BP the worse anxiety gets. Spoke to him about this cycle.   I told patient that I would route call to Dr. Mariah Milling.   I told patient to try to call PCP for ativan rx refill if needed. I also offered information on calm app to use prior to taking BP values.   Advised if he begins having sx described or if he has BP numbers 200 or greater to seek urgent medical care.   appt made for patient to come back in clinic Monday at his request. He will be seeing Gillian Shields, NP.

## 2020-01-15 NOTE — Telephone Encounter (Signed)
Patient calling  States that BP still has not come down This morning it was 175/105 HR 61 Transferred to Clinton Dorsey

## 2020-01-16 DIAGNOSIS — E119 Type 2 diabetes mellitus without complications: Secondary | ICD-10-CM | POA: Diagnosis not present

## 2020-01-17 NOTE — Telephone Encounter (Signed)
If blood pressure continues to run high he can continue diltiazem 120 twice daily  Carvedilol can also be adjusted up to 25 twice daily Continue losartan 100 If having generalized anxiety this can run his blood pressure up and needs to be managed by primary care, would recommend he make an appointment [employer] has Dr. Yetta Numbers who is since retired has He established care with someone else If anxiety gets severe he may need to see counselor/psychiatry Often we will escalate the medications and may not work in the setting of severe anxiety

## 2020-01-18 ENCOUNTER — Ambulatory Visit (INDEPENDENT_AMBULATORY_CARE_PROVIDER_SITE_OTHER): Payer: BC Managed Care – PPO | Admitting: Family

## 2020-01-18 ENCOUNTER — Other Ambulatory Visit: Payer: Self-pay

## 2020-01-18 ENCOUNTER — Encounter: Payer: Self-pay | Admitting: Family

## 2020-01-18 VITALS — BP 156/82 | HR 62 | Ht 66.0 in | Wt 177.2 lb

## 2020-01-18 DIAGNOSIS — I480 Paroxysmal atrial fibrillation: Secondary | ICD-10-CM

## 2020-01-18 DIAGNOSIS — E782 Mixed hyperlipidemia: Secondary | ICD-10-CM | POA: Diagnosis not present

## 2020-01-18 DIAGNOSIS — I10 Essential (primary) hypertension: Secondary | ICD-10-CM

## 2020-01-18 DIAGNOSIS — I340 Nonrheumatic mitral (valve) insufficiency: Secondary | ICD-10-CM

## 2020-01-18 MED ORDER — HYDROCHLOROTHIAZIDE 25 MG PO TABS: ORAL_TABLET | ORAL | 2 refills | Status: DC

## 2020-01-18 NOTE — Telephone Encounter (Signed)
Spoke with patient to review provider recommendations and he then wanted to know if he should keep appointment today. He states that he got nervous with the increased readings and it was scheduled to see APP in office. He reports BP today was 152/87, QSXQKS,081/388, 162/94, and 147/86. Recommended for him to keep appointment and she can review his readings and any other concerns. He verbalized understanding with no further questions at this time.

## 2020-01-18 NOTE — Progress Notes (Signed)
Office Visit    Patient Name: Clinton Dorsey Date of Encounter: 01/18/2020  Primary Care Provider:  Guadalupe Maple, MD Primary Cardiologist:  Ida Rogue, MD Electrophysiologist:  None   Chief Complaint    Clinton Dorsey is a 69 y.o. male with a hx of CAD s/p NSTEMI with DES to LCx, diagonal, distal RCA (2015), HLD, HTN, DM 2, prior tobacco use, paroxysmal atrial fibrillation, anxiety presents today for elevated blood pressures  Past Medical History    Past Medical History:  Diagnosis Date  . Arrhythmia   . Coronary artery disease    a. NSTEMI; b. 12/23/13 cath 11/2013: D1 80%, LCx 90% failed PCI, dRCA 99%, PL branch 99% s/p PCI/DES,  EF 55%; c. staged cardiac cath 12/2013: s/p PCI/DES to both D1 and mLCx, resdial LM 40%, ostial LCx 50%  . Depression   . Depression   . Diabetes mellitus ([place])    a. poorly controlled  . Hyperlipidemia   . Hypertension   . Hypokalemia    a. recurrent  . Kidney stones   . MI (myocardial infarction) ([place]) 11/2013  . Mitral regurgitation    a. echo 11/2013: EF 55-60%, mild LVH, mildly dilated LA, mild TR, mild to mod MR, mildly elevated RVSP    . Obesity   . Tobacco abuse    Past Surgical History:  Procedure Laterality Date  . CARDIAC CATHETERIZATION  12/08/2013  . CORONARY ANGIOPLASTY  12/08/2013   s/p stent placement  . HERNIA REPAIR    . LAMINECTOMY    . PERCUTANEOUS CORONARY STENT INTERVENTION (PCI-S) N/A 12/23/2013   Procedure: PERCUTANEOUS CORONARY STENT INTERVENTION (PCI-S);  Surgeon: Wellington Hampshire, MD;  Location: [facility] CATH LAB;  Service: Cardiovascular;  Laterality: N/A;  . SPINE SURGERY  2014  . TONSILLECTOMY      Allergies  Allergies  Allergen Reactions  . Penicillin G Anaphylaxis  . Atorvastatin Other (See Comments)    Myalgias  Myalgias  . Penicillins     Rash   . Simvastatin Other (See Comments)    Myalgias  Myalgias    History of Present Illness    Clinton Dorsey is a 69 y.o. male with a hx of CAD s/p  NSTEMI with DES to LCx, diagonal, distal RCA (2015), HLD, HTN, DM 2, prior tobacco use, paroxysmal atrial fibrillation, anxiety, hypokalemia. He was last seen 01/12/2020 by Dr. Rockey Situ.  His CAD with NSTEMI 2015 and subsequent DES to LCx, diagonal, distal RCA was done as a staged procedure.  Echocardiogram 10/2015 with normal LVEF, RVSP 40, moderate mitral regurgitation.  Previous Holter monitor 2017 showing APC, PVC, idioventricular escape complexes, and short run of SVT.  Seen in the ED 07/20/2019 for palpitations with EKG showing atrial fibrillation.  He was changed to Eliquis 5 mg twice daily and amlodipine was transitioned to diltiazem.  He converted overnight to NSR.  When seen 1 week ago and clinic BP elevated 161 systolic in the setting of anxiety.  He had taken lorazepam in the past for anxiety.  He declined blood pressure medications in the office.  His blood pressures the next day were 178/101 and 178/103 -diltiazem was increased to 120 mg twice daily.  He was noted to have headache.  Very pleasant gentleman. Tells me he has an "angst". We discussed that anxiety can be significantly contributory to BP. BP at home prior to office visit with SBP in the 150s. BP on initial check by CMA with SBP >200. Repeat BP 190/118. After our  discussion was down to 156/82.   Walks 2.5 miles twice per day and lifts light weight. Reports no shortness of breath nor dyspnea on exertion. Reports no chest pain, pressure, or tightness. No edema, orthopnea, PND. Reports no palpitations.    EKGs/Labs/Other Studies Reviewed:   The following studies were reviewed today:  Echo 10/2015 - Left ventricle: The cavity size was mildly dilated. There was    mild concentric hypertrophy. Systolic function was normal. The    estimated ejection fraction was in the range of 55% to 60%. Wall    motion was normal; there were no regional wall motion    abnormalities. Features are consistent with a pseudonormal left    ventricular  filling pattern, with concomitant abnormal relaxation    and increased filling pressure (grade 2 diastolic dysfunction).  - Mitral valve: There was moderate regurgitation.  - Left atrium: The atrium was moderately dilated.  - Pulmonary arteries: Systolic pressure was mildly increased. PA    peak pressure: 40 mm Hg (S).   EKG:  EKG is  ordered today.  The ekg ordered today demonstrates NSR 62 bpm with single PAC, minimal voltage criteria for LVH and unable to exclude prior anterior MI, stable compared to previou.   Recent Labs: 07/20/2019: BUN 24; Creatinine, Ser 1.01; Hemoglobin 15.7; Platelets 273; Potassium 3.5; Sodium 138; TSH 2.889  Recent Lipid Panel    Component Value Date/Time   CHOL 121 11/24/2018 1453   CHOL 164 07/10/2016 1525   CHOL 182 12/07/2013 0956   TRIG 133 11/24/2018 1453   TRIG 108 07/10/2016 1525   TRIG 224 (H) 12/07/2013 0956   HDL 39 (L) 11/24/2018 1453   HDL 33 (L) 12/07/2013 0956   CHOLHDL 3.1 11/24/2018 1453   VLDL 22 07/10/2016 1525   VLDL 45 (H) 12/07/2013 0956   LDLCALC 55 11/24/2018 1453   LDLCALC 104 (H) 12/07/2013 0956    Home Medications   Current Meds  Medication Sig  . apixaban (ELIQUIS) 5 MG TABS tablet Take 1 tablet (5 mg total) by mouth 2 (two) times daily.  Marland Kitchen buPROPion (WELLBUTRIN XL) 150 MG 24 hr tablet Take 1 tablet (150 mg total) by mouth daily.  . carvedilol (COREG) 12.5 MG tablet Take 1 tablet (12.5 mg total) by mouth 2 (two) times daily with a meal.  . Coenzyme Q10 10 MG capsule Take 10 mg by mouth daily.   Marland Kitchen diltiazem (CARDIZEM CD) 120 MG 24 hr capsule Take 1 capsule (120 mg total) by mouth in the morning and at bedtime.  Marland Kitchen ezetimibe (ZETIA) 10 MG tablet TAKE 1 TABLET(10 MG) BY MOUTH DAILY  . Glucose Blood (BLOOD GLUCOSE TEST STRIPS) STRP 1 strip by In Vitro route daily.  Marland Kitchen LORazepam (ATIVAN) 1 MG tablet Take 0.5-1 tablets (0.5-1 mg total) by mouth 2 (two) times daily as needed for anxiety.  Marland Kitchen losartan (COZAAR) 100 MG tablet TAKE 1  TABLET(100 MG) BY MOUTH DAILY  . magnesium oxide (MAG-OX) 400 MG tablet Take 400 mg by mouth daily.  . metFORMIN (GLUCOPHAGE) 500 MG tablet Take 500 mg by mouth 2 (two) times daily with a meal.   . Multiple Vitamin (MULTIVITAMIN) capsule Take 1 capsule by mouth daily.  . potassium chloride SA (KLOR-CON) 20 MEQ tablet Take 1 tablet (20 mEq total) by mouth daily.  . rosuvastatin (CRESTOR) 20 MG tablet TAKE 1 TABLET(20 MG) BY MOUTH DAILY  . sildenafil (REVATIO) 20 MG tablet TAKE 1-5 TABLETS BY MOUTH DAILY AS NEEDED  Review of Systems      Review of Systems  Constitution: Negative for chills, fever and malaise/fatigue.  Cardiovascular: Negative for chest pain, dyspnea on exertion, irregular heartbeat, leg swelling, near-syncope, orthopnea, palpitations and syncope.  Respiratory: Negative for cough, shortness of breath and wheezing.   Gastrointestinal: Negative for melena, nausea and vomiting.  Genitourinary: Negative for hematuria.  Neurological: Negative for dizziness, light-headedness and weakness.   All other systems reviewed and are otherwise negative except as noted above.  Physical Exam    VS:  BP (!) 156/82   Pulse 62   Ht 5\' 6"  (1.676 m)   Wt 177 lb 4 oz (80.4 kg)   SpO2 98%   BMI 28.61 kg/m  , BMI Body mass index is 28.61 kg/m. GEN: Well nourished, well developed, in no acute distress. HEENT: normal. Neck: Supple, no JVD, carotid bruits, or masses. Cardiac: RRR, no murmurs, rubs, or gallops. No clubbing, cyanosis, edema.  Radials/PT 2+ and equal bilaterally.  Respiratory:  Respirations regular and unlabored, clear to auscultation bilaterally. GI: Soft, nontender, nondistended, BS + x 4. MS: No deformity or atrophy. Skin: Warm and dry, no rash. Neuro:  Strength and sensation are intact. Psych: Normal affect.  Assessment & Plan    1. HTN - BP markedly elevated on initial presentation but improved to SBP 150s. Anticipate anxiety is contributory, as below. BP elevated  after office visit last week, Diltiazem 120mg  subsequently increased to twice daily.  Continue Coreg 12.5 mg BID, Diltiazem 120mg  BID, Losartan 100mg  daily. Hesitant to further increase CCB/beta blocker due to baseline bradycardia with resting HR in the 50s. Start Hydrochlorothiazide 12.5mg  daily. BMP in 2 weeks.   2. Anxiety - Likely contributory to his elevated BP. Reports being on PRN Alprazolam in the past with good relief. Endorses anxiety as his father died at 9 yo and he himself is approaching 69 years old. Encouraged to reach out to his PCP for appointment. Discussed utility of medical treatment or therapy.   3. CAD - Stable with no anginal symptoms. EKG today no acute St/T wave changes. No indication for ischemic evaluation at this time. Continue GDMT beta blocker and statin. No aspirin secondary to chronic anticoagulation.   4. PAF - EKG today NSR.  Continue Coreg 12.5mg  BID and Diltiazem 120mg  BID. For future refills could consider transition to Diltiazem 240mg  daily.   5. Chronic anticoagulation - Secondary to PAF. CHADS2VASc of at least 4 (age, HTN, CAD, DM2). Denies bleeding complications. Continue Eliquis 5mg  BID.   6. HLD - Lipid panel 01/07/20 with LDL 72. Continue Rosuvastatin 20mg  daily and Zetia 10mg  daily.   7. Moderate mitral regurgitation -by echo 11/02/2015.  Recommend optimal BP control.  Disposition: Follow up in 6 month(s) with Dr. 66, NP 01/18/2020, 8:08 PM

## 2020-01-18 NOTE — Patient Instructions (Addendum)
Medication Instructions:  Your physician has recommended you make the following change in your medication:   CONTINUE Diltiazem (Cardizem) 120mg  twice daily  CONTINUE Carvedilol (Coreg) 12.5mg  twice daily  CONTINUE Losartan 100mg  daily  START Hydrochlorothiazide (HCTZ) 12.5mg  daily May take an extra half tablet as needed for elevated blood pressure >170 that lasts greater than 1-2 hours.   *If you need a refill on your cardiac medications before your next appointment, please call your pharmacy*  Lab Work: Your physician recommends that you return for lab work in: 1-2 weeks for a [employer], Go to 1st desk on your right to register.  Address: [address], [place] [address]  Open: 8am - 5pm  Phone: [phone]. You do not need to be fasting.   Testing/Procedures: Your EKG today shows normal sinus rhythm with an occasional PAC (premature atrial complex) this is an early beat in the top chambers of your heart that is not dangerous and very common.    Follow-Up: At [facility], you and your health needs are our priority.  As part of our continuing mission to provide you with exceptional heart care, we have created designated Provider Care Teams.  These Care Teams include your primary Cardiologist (physician) and Advanced Practice Providers (APPs -  Physician Assistants and Nurse Practitioners) who all work together to provide you with the care you need, when you need it.  We recommend signing up for the patient portal called "MyChart".  Sign up information is provided on this After Visit Summary.  MyChart is used to connect with patients for Virtual Visits (Telemedicine).  Patients are able to view lab/test results, encounter notes, upcoming appointments, etc.  Non-urgent messages can be sent to your provider as well.   To learn more about what you can do with MyChart, go to [email or web address].    Your next appointment:   6 month(s)  The format  for your next appointment:   In Person  Provider:   You may see Ida Rogue, MD or the following Advanced Practice Provider on your designated Care Team:    Laurann Montana, FNP  Other Instructions   Recommend making an appointment with your primary care doctor regarding anxiety.   In 1 week send a report of your blood pressure via MyChart.   Tips to Measure your Blood Pressure Correctly  To determine whether you have hypertension, a medical professional will take a blood pressure reading. How you prepare for the test, the position of your arm, and other factors can change a blood pressure reading by 10% or more. That could be enough to hide high blood pressure, start you on a drug you don't really need, or lead your doctor to incorrectly adjust your medications.  National and international guidelines offer specific instructions for measuring blood pressure. If a doctor, nurse, or medical assistant isn't doing it right, don't hesitate to ask him or her to get with the guidelines.  Here's what you can do to ensure a correct reading: . Don't drink a caffeinated beverage or smoke during the 30 minutes before the test. . Sit quietly for five minutes before the test begins. . During the measurement, sit in a chair with your feet on the floor and your arm supported so your elbow is at about heart level. . The inflatable part of the cuff should completely cover at least 80% of your upper arm, and the cuff should be placed on bare skin, not over a shirt. . Don't  talk during the measurement. . Have your blood pressure measured twice, with a brief break in between. If the readings are different by 5 points or more, have it done a third time.  There are times to break these rules. If you sometimes feel lightheaded when getting out of bed in the morning or when you stand after sitting, you should have your blood pressure checked while seated and then while standing to see if it falls from one  position to the next.  Because blood pressure varies throughout the day, your doctor will rarely diagnose hypertension on the basis of a single reading. Instead, he or she will want to confirm the measurements on at least two occasions, usually within a few weeks of one another. The exception to this rule is if you have a blood pressure reading of 180/110 mm Hg or higher. A result this high usually calls for prompt treatment.  It's a good idea to have your blood pressure measured in both arms at least once, since the reading in one arm (usually the right) may be higher than that in the left. A 2014 study in The American Journal of Medicine of nearly 3,400 people found average arm- to-arm differences in systolic blood pressure of about 5 points. The higher number should be used to make treatment decisions.  In 2017, new guidelines from the American Heart Association, the [employer] of Cardiology, and nine other health organizations lowered the diagnosis of high blood pressure to 130/80 mm Hg or higher for all adults. The guidelines also redefined the various blood pressure categories to now include normal, elevated, Stage 1 hypertension, Stage 2 hypertension, and hypertensive crisis (see "Blood pressure categories").  Blood pressure categories  Blood pressure category SYSTOLIC (upper number)  DIASTOLIC (lower number)  Normal Less than 120 mm Hg and Less than 80 mm Hg  Elevated 120-129 mm Hg and Less than 80 mm Hg  High blood pressure: Stage 1 hypertension 130-139 mm Hg or 80-89 mm Hg  High blood pressure: Stage 2 hypertension 140 mm Hg or higher or 90 mm Hg or higher  Hypertensive crisis (consult your doctor immediately) Higher than 180 mm Hg and/or Higher than 120 mm Hg  Source: American Heart Association and American Stroke Association. For more on getting your blood pressure under control, buy Controlling Your Blood Pressure, a Special Health Report from [facility].   Blood  Pressure Log   Date   Time  Blood Pressure  Position  Example: Nov 1 9 AM 124/78 sitting

## 2020-01-28 ENCOUNTER — Telehealth: Payer: Self-pay | Admitting: Cardiovascular Disease

## 2020-01-28 NOTE — Telephone Encounter (Signed)
Spoke with patient and reviewed instructions for his hydrochlorothiazide to take additional dose 1/2 tablet for SBP >170. He verbalized understanding and will continue monitoring his blood pressures with the extra dose of medication. Advised that I would let her know about this and to please call back if these elevated pressures persist. He verbalized understanding of our conversation, and had no further questions at this time.

## 2020-01-28 NOTE — Telephone Encounter (Signed)
Patient calling  Patient takes a half hydrochlorothiazide pill BP has been running high, 170/95 - would like to know if he should start taking a whole pill Please call to discuss

## 2020-01-29 ENCOUNTER — Encounter: Payer: Self-pay | Admitting: Family

## 2020-01-29 NOTE — Telephone Encounter (Signed)
Thank you! I've sent him a MyChart message as follow up.   Alver Sorrow, NP

## 2020-02-01 ENCOUNTER — Other Ambulatory Visit
Admission: RE | Admit: 2020-02-01 | Discharge: 2020-02-01 | Disposition: A | Discharge status: Home / Self Care | Payer: BC Managed Care – PPO | Source: Ambulatory Visit | Attending: Family | Admitting: Family

## 2020-02-01 DIAGNOSIS — I480 Paroxysmal atrial fibrillation: Secondary | ICD-10-CM | POA: Insufficient documentation

## 2020-02-01 DIAGNOSIS — I10 Essential (primary) hypertension: Secondary | ICD-10-CM | POA: Diagnosis not present

## 2020-02-01 LAB — BASIC METABOLIC PANEL
Anion gap: 10 (ref 5–15)
BUN: 27 mg/dL — ABNORMAL HIGH (ref 8–23)
CO2: 29 mmol/L (ref 22–32)
Calcium: 9.4 mg/dL (ref 8.9–10.3)
Chloride: 100 mmol/L (ref 98–111)
Creatinine, Ser: 1.04 mg/dL (ref 0.61–1.24)
GFR calc Af Amer: >60 mL/min (ref >60)
GFR calc non Af Amer: >60 mL/min (ref >60)
Glucose, Bld: 128 mg/dL — ABNORMAL HIGH (ref 70–99)
Potassium: 3.5 mmol/L (ref 3.5–5.1)
Sodium: 139 mmol/L (ref 135–145)

## 2020-02-03 DIAGNOSIS — I10 Essential (primary) hypertension: Secondary | ICD-10-CM | POA: Diagnosis not present

## 2020-02-03 DIAGNOSIS — E1159 Type 2 diabetes mellitus with other circulatory complications: Secondary | ICD-10-CM | POA: Diagnosis not present

## 2020-02-16 DIAGNOSIS — E119 Type 2 diabetes mellitus without complications: Secondary | ICD-10-CM | POA: Diagnosis not present

## 2020-02-17 DIAGNOSIS — I480 Paroxysmal atrial fibrillation: Secondary | ICD-10-CM | POA: Diagnosis not present

## 2020-02-17 DIAGNOSIS — I2510 Atherosclerotic heart disease of native coronary artery without angina pectoris: Secondary | ICD-10-CM | POA: Diagnosis not present

## 2020-02-17 DIAGNOSIS — I10 Essential (primary) hypertension: Secondary | ICD-10-CM | POA: Diagnosis not present

## 2020-02-17 DIAGNOSIS — E1159 Type 2 diabetes mellitus with other circulatory complications: Secondary | ICD-10-CM | POA: Diagnosis not present

## 2020-03-08 ENCOUNTER — Telehealth: Payer: Self-pay | Admitting: Cardiovascular Disease

## 2020-03-08 NOTE — Telephone Encounter (Signed)
Spoke with patient and he did forget his crestor and blood sugars were better and then he started having pain, body aches, and muscle atrophy. After not taking for one week he started feeling better and blood sugars were back to normal. Will add as intolerance and forward to provider for any further recommendations. He verbalized understanding of our conversation, agreement with plan, and had no further questions at this time.

## 2020-03-08 NOTE — Telephone Encounter (Signed)
Patient states he forgot his Crestor for a week and noticed his blood sugar went to normal. When he went back on the Crestor, his blood sugar was elevated . Please call to discuss.

## 2020-03-10 NOTE — Telephone Encounter (Signed)
Options include try half dose Crestor, 10 mg daily Or change to alternate statin such as Lipitor 20 mg daily Stay on Zetia If he does not want a statin we would wait to 3 months, recheck cholesterol, numbers would be higher which would likely qualify him for injectable medication, PCSK9 inhibitor that he would take monthly Goal LDL less than 70

## 2020-03-15 MED ORDER — ROSUVASTATIN CALCIUM 5 MG PO TABS
5.0000 mg | ORAL_TABLET | Freq: Every day | ORAL | 3 refills | Status: DC
Start: 2020-03-15 — End: 2020-03-15

## 2020-03-15 NOTE — Telephone Encounter (Signed)
Spoke with patient and he was agreeable to decreasing his medication stating that his primary care decreased Crestor to 5 mg once daily. He also was agreeable to continue that dose and ezetimibe. Called pharmacy to confirm dose of medication and they have it in there from his PCP office. He verbalized understanding with no further questions at this time.

## 2020-03-17 DIAGNOSIS — E119 Type 2 diabetes mellitus without complications: Secondary | ICD-10-CM | POA: Diagnosis not present

## 2020-04-03 ENCOUNTER — Other Ambulatory Visit: Payer: Self-pay | Admitting: Cardiovascular Disease

## 2020-04-17 DIAGNOSIS — E119 Type 2 diabetes mellitus without complications: Secondary | ICD-10-CM | POA: Diagnosis not present

## 2020-05-18 DIAGNOSIS — E119 Type 2 diabetes mellitus without complications: Secondary | ICD-10-CM | POA: Diagnosis not present

## 2020-05-26 DIAGNOSIS — E7800 Pure hypercholesterolemia, unspecified: Secondary | ICD-10-CM | POA: Diagnosis not present

## 2020-05-26 DIAGNOSIS — I10 Essential (primary) hypertension: Secondary | ICD-10-CM | POA: Diagnosis not present

## 2020-05-26 DIAGNOSIS — E1159 Type 2 diabetes mellitus with other circulatory complications: Secondary | ICD-10-CM | POA: Diagnosis not present

## 2020-06-02 DIAGNOSIS — I2510 Atherosclerotic heart disease of native coronary artery without angina pectoris: Secondary | ICD-10-CM | POA: Diagnosis not present

## 2020-06-02 DIAGNOSIS — I10 Essential (primary) hypertension: Secondary | ICD-10-CM | POA: Diagnosis not present

## 2020-06-02 DIAGNOSIS — E1159 Type 2 diabetes mellitus with other circulatory complications: Secondary | ICD-10-CM | POA: Diagnosis not present

## 2020-06-02 DIAGNOSIS — Z23 Encounter for immunization: Secondary | ICD-10-CM | POA: Diagnosis not present

## 2020-06-03 ENCOUNTER — Other Ambulatory Visit: Payer: Self-pay | Admitting: Cardiovascular Disease

## 2020-06-03 DIAGNOSIS — E089 Diabetes mellitus due to underlying condition without complications: Secondary | ICD-10-CM | POA: Diagnosis not present

## 2020-06-03 DIAGNOSIS — H2513 Age-related nuclear cataract, bilateral: Secondary | ICD-10-CM | POA: Diagnosis not present

## 2020-06-03 DIAGNOSIS — H43812 Vitreous degeneration, left eye: Secondary | ICD-10-CM | POA: Diagnosis not present

## 2020-06-03 DIAGNOSIS — E119 Type 2 diabetes mellitus without complications: Secondary | ICD-10-CM | POA: Diagnosis not present

## 2020-06-03 DIAGNOSIS — H524 Presbyopia: Secondary | ICD-10-CM | POA: Diagnosis not present

## 2020-06-03 NOTE — Telephone Encounter (Signed)
Prescription refill request for Eliquis received.  Last office visit: Dan Humphreys 01/18/2020 Scr: 1.04, 02/01/2020 Age: 69 y.o. Weight: 80.4 kg   Prescription refill sent.

## 2020-06-03 NOTE — Telephone Encounter (Signed)
Refill Request.  

## 2020-06-17 DIAGNOSIS — E119 Type 2 diabetes mellitus without complications: Secondary | ICD-10-CM | POA: Diagnosis not present

## 2020-07-02 ENCOUNTER — Other Ambulatory Visit: Payer: Self-pay | Admitting: Cardiovascular Disease

## 2020-07-06 DIAGNOSIS — N62 Hypertrophy of breast: Secondary | ICD-10-CM | POA: Diagnosis not present

## 2020-07-06 DIAGNOSIS — I10 Essential (primary) hypertension: Secondary | ICD-10-CM | POA: Diagnosis not present

## 2020-07-08 DIAGNOSIS — I10 Essential (primary) hypertension: Secondary | ICD-10-CM | POA: Diagnosis not present

## 2020-07-08 DIAGNOSIS — N62 Hypertrophy of breast: Secondary | ICD-10-CM | POA: Diagnosis not present

## 2020-07-11 ENCOUNTER — Ambulatory Visit: Payer: BC Managed Care – PPO | Admitting: Cardiovascular Disease

## 2020-07-18 DIAGNOSIS — E119 Type 2 diabetes mellitus without complications: Secondary | ICD-10-CM | POA: Diagnosis not present

## 2020-07-29 ENCOUNTER — Emergency Department: Payer: No Typology Code available for payment source

## 2020-07-29 ENCOUNTER — Emergency Department
Admission: EM | Admit: 2020-07-29 | Discharge: 2020-07-30 | Disposition: A | Discharge status: Home / Self Care | Payer: No Typology Code available for payment source | Attending: Emergency Medicine | Admitting: Emergency Medicine

## 2020-07-29 ENCOUNTER — Other Ambulatory Visit: Payer: Self-pay

## 2020-07-29 DIAGNOSIS — E1169 Type 2 diabetes mellitus with other specified complication: Secondary | ICD-10-CM | POA: Insufficient documentation

## 2020-07-29 DIAGNOSIS — W19XXXA Unspecified fall, initial encounter: Secondary | ICD-10-CM

## 2020-07-29 DIAGNOSIS — I25111 Atherosclerotic heart disease of native coronary artery with angina pectoris with documented spasm: Secondary | ICD-10-CM | POA: Diagnosis not present

## 2020-07-29 DIAGNOSIS — S161XXA Strain of muscle, fascia and tendon at neck level, initial encounter: Secondary | ICD-10-CM

## 2020-07-29 DIAGNOSIS — Z7984 Long term (current) use of oral hypoglycemic drugs: Secondary | ICD-10-CM | POA: Diagnosis not present

## 2020-07-29 DIAGNOSIS — E1159 Type 2 diabetes mellitus with other circulatory complications: Secondary | ICD-10-CM | POA: Diagnosis not present

## 2020-07-29 DIAGNOSIS — Z87891 Personal history of nicotine dependence: Secondary | ICD-10-CM | POA: Insufficient documentation

## 2020-07-29 DIAGNOSIS — E875 Hyperkalemia: Secondary | ICD-10-CM | POA: Insufficient documentation

## 2020-07-29 DIAGNOSIS — Z7901 Long term (current) use of anticoagulants: Secondary | ICD-10-CM | POA: Diagnosis not present

## 2020-07-29 DIAGNOSIS — I10 Essential (primary) hypertension: Secondary | ICD-10-CM | POA: Insufficient documentation

## 2020-07-29 DIAGNOSIS — Y990 Civilian activity done for income or pay: Secondary | ICD-10-CM | POA: Insufficient documentation

## 2020-07-29 DIAGNOSIS — Z79899 Other long term (current) drug therapy: Secondary | ICD-10-CM | POA: Insufficient documentation

## 2020-07-29 DIAGNOSIS — Z955 Presence of coronary angioplasty implant and graft: Secondary | ICD-10-CM | POA: Diagnosis not present

## 2020-07-29 DIAGNOSIS — S199XXA Unspecified injury of neck, initial encounter: Secondary | ICD-10-CM | POA: Diagnosis present

## 2020-07-29 DIAGNOSIS — W208XXA Other cause of strike by thrown, projected or falling object, initial encounter: Secondary | ICD-10-CM | POA: Insufficient documentation

## 2020-07-29 LAB — BASIC METABOLIC PANEL
Anion gap: 12 (ref 5–15)
BUN: 26 mg/dL — ABNORMAL HIGH (ref 8–23)
CO2: 25 mmol/L (ref 22–32)
Calcium: 9 mg/dL (ref 8.9–10.3)
Chloride: 99 mmol/L (ref 98–111)
Creatinine, Ser: 1.16 mg/dL (ref 0.61–1.24)
GFR, Estimated: >60 mL/min (ref >60)
Glucose, Bld: 117 mg/dL — ABNORMAL HIGH (ref 70–99)
Potassium: 3.9 mmol/L (ref 3.5–5.1)
Sodium: 136 mmol/L (ref 135–145)

## 2020-07-29 LAB — CBC
HCT: 39.7 % (ref 39.0–52.0)
Hemoglobin: 13.5 g/dL (ref 13.0–17.0)
MCH: 31.2 pg (ref 26.0–34.0)
MCHC: 34 g/dL (ref 30.0–36.0)
MCV: 91.7 fL (ref 80.0–100.0)
Platelets: 249 10*3/uL (ref 150–400)
RBC: 4.33 MIL/uL (ref 4.22–5.81)
RDW: 12.2 % (ref 11.5–15.5)
WBC: 7.8 10*3/uL (ref 4.0–10.5)
nRBC: 0 % (ref 0.0–0.2)

## 2020-07-29 LAB — TROPONIN I (HIGH SENSITIVITY): Troponin I (High Sensitivity): 17 ng/L (ref <18)

## 2020-07-29 NOTE — ED Notes (Signed)
Rn called [facility] twice busy signal at [phone]

## 2020-07-29 NOTE — ED Notes (Signed)
Called [employer] at [phone] spoke with Boneta Lucks who reports manager was not available provided RN phone number wanted to know since not a profile in the system what they need for worker's comp.

## 2020-07-29 NOTE — ED Triage Notes (Signed)
PT to ED from work. PT was moving a trampoline with a push cart when the wheels went off the curb pulling the pt forward. PT hit jaw and chest on the box. PT having chest, neck and shoulder pain. PT takes eliquis, no LOC

## 2020-07-29 NOTE — ED Notes (Signed)
Pt reports fell at work whole box of trampoline fell on pt, pt reports  hurting left side of neck and shoulder. Pt takes blood thinners.

## 2020-07-29 NOTE — ED Provider Notes (Signed)
[facility] Emergency Department Provider Note   ____________________________________________   First MD Initiated Contact with Patient 07/29/20 2325     (approximate)  I have reviewed the triage vital signs and the nursing notes.   HISTORY  Chief Complaint Neck Pain and Fall  History obtained via patient and spouse  HPI Clinton Dorsey is a 69 y.o. 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  Patient was moving a very heavy trampoline on a cart when the wheels went off the curb and pulled the patient forward.  Patient struck his jaw and left chest on the box.  Complains mostly of left posterior neck pain.  Denies striking head or LOC.  Patient does take Eliquis.  Incident occurred approximately 2 hours prior to arrival.  Denies headache, vision changes, shortness of breath, abdominal pain, hematuria, nausea, vomiting or dizziness.     Past Medical History:  Diagnosis Date  . Arrhythmia   . Coronary artery disease    a. NSTEMI; b. 12/23/13 cath 11/2013: D1 80%, LCx 90% failed PCI, dRCA 99%, PL branch 99% s/p PCI/DES,  EF 55%; c. staged cardiac cath 12/2013: s/p PCI/DES to both D1 and mLCx, resdial LM 40%, ostial LCx 50%  . Depression   . Depression   . Diabetes mellitus (HCC)    a. poorly controlled  . Hyperlipidemia   . Hypertension   . Hypokalemia    a. recurrent  . Kidney stones   . MI (myocardial infarction) (HCC) 11/2013  . Mitral regurgitation    a. echo 11/2013: EF 55-60%, mild LVH, mildly dilated LA, mild TR, mild to mod MR, mildly elevated RVSP    . Obesity   . Tobacco abuse     Patient Active Problem List   Diagnosis Date Noted  . Pulmonary nodule 07/20/2019  . Recurrent major depressive disorder, in partial remission (HCC) 11/24/2018  . Advanced care planning/counseling discussion 05/27/2017  . Erectile dysfunction 07/10/2016  . Acute anxiety 12/15/2015  . Mitral  regurgitation   . Tobacco abuse   . Depression   . Poorly controlled type 2 diabetes mellitus with circulatory disorder (HCC) 06/03/2015  . Angina pectoris (HCC)   . Hypokalemia   . Esophageal spasm   . Coronary artery disease involving native coronary artery of native heart with angina pectoris with documented spasm (HCC)   . Hyperlipidemia   . NSTEMI (non-ST elevated myocardial infarction) (HCC) 12/14/2013  . Stented coronary artery - D1, mCx 12/14/2013  . Essential hypertension 12/14/2013  . Spinal stenosis of lumbar region without neurogenic claudication 10/24/2012    Past Surgical History:  Procedure Laterality Date  . CARDIAC CATHETERIZATION  12/08/2013  . CORONARY ANGIOPLASTY  12/08/2013   s/p stent placement  . HERNIA REPAIR    . LAMINECTOMY    . PERCUTANEOUS CORONARY STENT INTERVENTION (PCI-S) N/A 12/23/2013   Procedure: PERCUTANEOUS CORONARY STENT INTERVENTION (PCI-S);  Surgeon: [name], MD;  Location: [facility] CATH LAB;  Service: Cardiovascular;  Laterality: N/A;  . SPINE SURGERY  2014  . TONSILLECTOMY      Prior to Admission medications   Medication Sig Start Date End Date Taking? Authorizing Provider  buPROPion (WELLBUTRIN XL) 150 MG 24 hr tablet Take 1 tablet (150 mg total) by mouth daily. 12/14/19   Particia Nearing, PA-C  carvedilol (COREG) 12.5 MG tablet Take 1 tablet (12.5 mg total) by mouth 2 (two) times daily with a meal.  01/04/20   Antonieta Iba, MD  Coenzyme Q10 10 MG capsule Take 10 mg by mouth daily.     [provider]  diazepam (VALIUM) 2 MG tablet Take 1 tablet (2 mg total) by mouth every 8 (eight) hours as needed for muscle spasms. 07/30/20   Irean Hong, MD  diltiazem (CARDIZEM CD) 120 MG 24 hr capsule Take 1 capsule (120 mg total) by mouth in the morning and at bedtime. 01/13/20 01/12/21  Antonieta Iba, MD  ELIQUIS 5 MG TABS tablet TAKE 1 TABLET(5 MG) BY MOUTH TWICE DAILY 06/03/20   Antonieta Iba, MD  ezetimibe (ZETIA) 10 MG  tablet TAKE 1 TABLET(10 MG) BY MOUTH DAILY 07/04/20   Antonieta Iba, MD  Glucose Blood (BLOOD GLUCOSE TEST STRIPS) STRP 1 strip by In Vitro route daily. 11/24/18   Steele Sizer, MD  hydrochlorothiazide (HYDRODIURIL) 25 MG tablet Take 12.5 mg (half tablet) daily. May take additional half tablet as needed for high blood pressure >170 that does not resolve after one hour. 01/18/20   Alver Sorrow, NP  HYDROcodone-acetaminophen (NORCO) 5-325 MG tablet Take 1 tablet by mouth every 6 (six) hours as needed for moderate pain. 07/30/20   Irean Hong, MD  LORazepam (ATIVAN) 1 MG tablet Take 0.5-1 tablets (0.5-1 mg total) by mouth 2 (two) times daily as needed for anxiety. 05/27/17   Steele Sizer, MD  losartan (COZAAR) 100 MG tablet TAKE 1 TABLET(100 MG) BY MOUTH DAILY 07/04/20   Antonieta Iba, MD  magnesium oxide (MAG-OX) 400 MG tablet Take 400 mg by mouth daily.    [provider]  metFORMIN (GLUCOPHAGE) 500 MG tablet Take 500 mg by mouth 2 (two) times daily with a meal.  01/11/20   [provider]  Multiple Vitamin (MULTIVITAMIN) capsule Take 1 capsule by mouth daily.    [provider]  potassium chloride SA (KLOR-CON) 20 MEQ tablet Take 1 tablet (20 mEq total) by mouth daily. 01/12/20   Antonieta Iba, MD  rosuvastatin (CRESTOR) 5 MG tablet Take 5 mg by mouth daily.    [provider]  sildenafil (REVATIO) 20 MG tablet TAKE 1-5 TABLETS BY MOUTH DAILY AS NEEDED 12/15/19   Antonieta Iba, MD    Allergies Penicillin g, Atorvastatin, Penicillins, Simvastatin, and Crestor [rosuvastatin calcium]  Family History  Problem Relation Age of Onset  . Heart attack Father 32       MI  . Heart disease Father        CABG  . Hypertension Mother   . Hypertension Sister     Social History Social History   Tobacco Use  . Smoking status: Former Smoker    Types: Cigars    Quit date: 12/08/2013    Years since quitting: 6.6  . Smokeless tobacco: Never Used   Vaping Use  . Vaping Use: Never assessed  Substance Use Topics  . Alcohol use: No    Alcohol/week: 1.0 standard drink    Types: 1 Glasses of wine per week    Comment: occasional  . Drug use: No    Review of Systems  Constitutional: No fever/chills Eyes: No visual changes. ENT: No sore throat. Cardiovascular: Denies chest pain. Respiratory: Denies shortness of breath. Gastrointestinal: No abdominal pain.  No nausea, no vomiting.  No diarrhea.  No constipation. Genitourinary: Negative for dysuria. Musculoskeletal: Positive for neck pain.  Negative for back pain. Skin: Negative for rash. Neurological: Negative for headaches, focal weakness or  numbness.   ____________________________________________   PHYSICAL EXAM:  VITAL SIGNS: ED Triage Vitals  Enc Vitals Group     BP 07/29/20 2024 (!) 187/107     Pulse Rate 07/29/20 2024 64     Resp 07/29/20 2024 18     Temp 07/29/20 2024 98.6 F (37 C)     Temp Source 07/29/20 2024 Oral     SpO2 07/29/20 2024 96 %     Weight 07/29/20 2025 175 lb (79.4 kg)     Height 07/29/20 2025 5\' 6"  (1.676 m)     Head Circumference --      Peak Flow --      Pain Score 07/29/20 2025 9     Pain Loc --      Pain Edu? --      Excl. in GC? --     Constitutional: Alert and oriented. Well appearing and in no acute distress. Eyes: Conjunctivae are normal. PERRL. EOMI. Head: Atraumatic. Nose: Atraumatic. Mouth/Throat: Mucous membranes are moist.  No dental malocclusion.   Neck: No stridor.  No cervical spine tenderness to palpation.  Left posterior neck pain to movement.  No expanding hematoma.  No ecchymosis. Cardiovascular: Normal rate, regular rhythm. Grossly normal heart sounds.  Good peripheral circulation. Respiratory: Normal respiratory effort.  No retractions. Lungs CTAB.  Left anterior chest mildly tender to palpation.  No hematoma or ecchymosis noted. Gastrointestinal: Soft and nontender to light or deep palpation. No distention. No  abdominal bruits. No CVA tenderness. Musculoskeletal: No lower extremity tenderness nor edema.  No joint effusions. Neurologic: Alert and oriented x3.  CN II to XII grossly intact.  Normal speech and language. No gross focal neurologic deficits are appreciated. No gait instability. Skin:  Skin is warm, dry and intact. No rash noted.  No deformities noted.  No hematoma, bruising or ecchymosis noted. Psychiatric: Mood and affect are normal. Speech and behavior are normal.  ____________________________________________   LABS (all labs ordered are listed, but only abnormal results are displayed)  Labs Reviewed  BASIC METABOLIC PANEL - Abnormal; Notable for the following components:      Result Value   Glucose, Bld 117 (*)    BUN 26 (*)    All other components within normal limits  CBC  TROPONIN I (HIGH SENSITIVITY)   ____________________________________________  EKG  ED ECG REPORT I, Randell Teare J, the attending physician, personally viewed and interpreted this ECG.   Date: 07/29/2020  EKG Time: 2030  Rate: 63  Rhythm: normal EKG, normal sinus rhythm  Axis: Normal  Intervals:none  ST&T Change: Nonspecific  ____________________________________________  RADIOLOGY I, Gaige Sebo J, personally viewed and evaluated these images (plain radiographs) as part of my medical decision making, as well as reviewing the written report by the radiologist.  ED MD interpretation: No acute cardiopulmonary process; No ICH, no cervical spine injury or expanding hematoma  Official radiology report(s): DG Chest 2 View  Result Date: 07/29/2020 CLINICAL DATA:  Chest pain EXAM: CHEST - 2 VIEW COMPARISON:  07/20/2019 FINDINGS: No focal opacity or pleural effusion. Stable cardiomediastinal silhouette with aortic atherosclerosis. No pneumothorax. IMPRESSION: No active cardiopulmonary disease. Electronically Signed   By: 13/10/2018 M.D.   On: 07/29/2020 21:06   CT Head Wo Contrast  Result Date:  07/30/2020 CLINICAL DATA:  Neck injury, neck pain, facial injury EXAM: CT HEAD WITHOUT CONTRAST CT CERVICAL SPINE WITHOUT CONTRAST TECHNIQUE: Multidetector CT imaging of the head and cervical spine was performed following the standard protocol without intravenous contrast. Multiplanar  CT image reconstructions of the cervical spine were also generated. COMPARISON:  None. FINDINGS: CT HEAD FINDINGS Brain: Normal anatomic configuration. Parenchymal volume loss is commensurate with the patient's age. Mild periventricular white matter changes are present likely reflecting the sequela of small vessel ischemia. No abnormal intra or extra-axial mass lesion or fluid collection. No abnormal mass effect or midline shift. No evidence of acute intracranial hemorrhage or infarct. Ventricular size is normal. Cerebellum unremarkable. Vascular: No asymmetric hyperdense vasculature at the skull base. Skull: Intact. A circumscribed 2.2 cm lytic lesion is seen centered within the right temporal diploic space with mild expansion of the calvarium but no associated endosteal scalloping or cortical breakthrough. 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 Less likely, this could reflect the subacute phase of focal Paget's disease. Sinuses/Orbits: Mild mucosal thickening within the right maxillary sinus. Remaining paranasal sinuses are clear. Orbits are unremarkable. Other: Mastoid air cells and middle ear cavities are clear. CT CERVICAL SPINE FINDINGS Alignment: Normal.  No listhesis. Skull base and vertebrae: Craniocervical alignment is normal. Atlantal dental interval is normal. No acute fracture of the cervical spine. No lytic or blastic bone lesion identified. 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 This in combination with posterior disc osteophyte  complex ease at C4-5 and C5-6 results in moderate to severe central canal stenosis with an AP diameter of 5-6 mm and marked flattening of the thecal sac. No canal hematoma. No prevertebral soft tissue swelling. No paraspinal fluid collections are identified. Disc levels: Review of the sagittal reformats demonstrates mild reversal of the normal cervical lordosis. There is intervertebral disc space narrowing and endplate remodeling throughout the visualized thoracolumbar spine in keeping with changes of diffuse severe degenerative disc disease. Pursued oriented disc osteophytes are noted throughout the cervical spine. As noted above, the spinal canal is diffusely congenitally narrowed. The prevertebral soft tissues are not thickened. Review of the axial images demonstrates multilevel uncovertebral arthrosis resulting in multilevel moderate to severe neural foraminal narrowing best noted bilaterally at C3-4, C4-5, C5-6, and C6-7. Posterior disc osteophyte complex ease at C4-5, C5-6, and C6-7 result in moderate to severe central canal stenosis, most severe at C5-6 with marked flattening of the thecal sac. Upper chest: Visualized lung apices are unremarkable Other: Moderate atherosclerotic calcification is seen within the left carotid bifurcation. Mild atherosclerotic calcification is seen within the aortic arch. IMPRESSION: No acute intracranial injury.  No calvarial fracture. 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 No acute fracture or listhesis of the cervical spine. 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 Resultant multilevel moderate to severe neural foraminal narrowing and moderate to severe central canal stenosis as described above. Electronically Signed   By: Helyn Numbers MD   On: 07/30/2020 00:51   CT Cervical Spine Wo Contrast  Result Date: 07/30/2020 CLINICAL DATA:  Neck injury, neck  pain, facial injury EXAM: CT HEAD WITHOUT CONTRAST CT CERVICAL SPINE WITHOUT CONTRAST TECHNIQUE: Multidetector CT imaging of the head and cervical spine was performed following the standard protocol without intravenous contrast. Multiplanar CT image reconstructions of the cervical spine were also generated. COMPARISON:  None. FINDINGS: CT HEAD FINDINGS Brain: Normal anatomic configuration. Parenchymal volume loss is commensurate with the patient's age. Mild periventricular white matter changes are present likely reflecting the  sequela of small vessel ischemia. No abnormal intra or extra-axial mass lesion or fluid collection. No abnormal mass effect or midline shift. No evidence of acute intracranial hemorrhage or infarct. Ventricular size is normal. Cerebellum unremarkable. Vascular: No asymmetric hyperdense vasculature at the skull base. Skull: Intact. A circumscribed 2.2 cm lytic lesion is seen centered within the right temporal diploic space with mild expansion of the calvarium but no associated endosteal scalloping or cortical breakthrough. 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 Less likely, this could reflect the subacute phase of focal Paget's disease. Sinuses/Orbits: Mild mucosal thickening within the right maxillary sinus. Remaining paranasal sinuses are clear. Orbits are unremarkable. Other: Mastoid air cells and middle ear cavities are clear. CT CERVICAL SPINE FINDINGS Alignment: Normal.  No listhesis. Skull base and vertebrae: Craniocervical alignment is normal. Atlantal dental interval is normal. No acute fracture of the cervical spine. No lytic or blastic bone lesion identified. 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 This in combination with posterior disc osteophyte complex ease at C4-5 and C5-6 results in moderate  to severe central canal stenosis with an AP diameter of 5-6 mm and marked flattening of the thecal sac. No canal hematoma. No prevertebral soft tissue swelling. No paraspinal fluid collections are identified. Disc levels: Review of the sagittal reformats demonstrates mild reversal of the normal cervical lordosis. There is intervertebral disc space narrowing and endplate remodeling throughout the visualized thoracolumbar spine in keeping with changes of diffuse severe degenerative disc disease. Pursued oriented disc osteophytes are noted throughout the cervical spine. As noted above, the spinal canal is diffusely congenitally narrowed. The prevertebral soft tissues are not thickened. Review of the axial images demonstrates multilevel uncovertebral arthrosis resulting in multilevel moderate to severe neural foraminal narrowing best noted bilaterally at C3-4, C4-5, C5-6, and C6-7. Posterior disc osteophyte complex ease at C4-5, C5-6, and C6-7 result in moderate to severe central canal stenosis, most severe at C5-6 with marked flattening of the thecal sac. Upper chest: Visualized lung apices are unremarkable Other: Moderate atherosclerotic calcification is seen within the left carotid bifurcation. Mild atherosclerotic calcification is seen within the aortic arch. IMPRESSION: No acute intracranial injury.  No calvarial fracture. 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 No acute fracture or listhesis of the cervical spine. 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 Resultant multilevel moderate to severe neural foraminal narrowing and moderate to severe central canal stenosis as described above. Electronically Signed   By: Helyn Numbers MD   On: 07/30/2020 00:51    ____________________________________________   PROCEDURES  Procedure(s) performed (including Critical  Care):  Procedures   ____________________________________________   INITIAL IMPRESSION / ASSESSMENT AND PLAN / ED COURSE  As part of my medical decision making, I reviewed the following data within the electronic MEDICAL RECORD NUMBER History obtained from family, Nursing notes reviewed and incorporated, Labs reviewed, EKG interpreted, Old chart reviewed (office visits for diabetes, PAF), Radiograph reviewed and Notes from prior ED visits (07/2019 visit for A. fib)     69 year old male on Eliquis who presents with neck pain status post mechanical fall.  Differential diagnosis includes but is not limited to ICH, cervical spine injury, hematoma, etc.  Laboratory results unremarkable; troponin stable from prior.  Chest x-ray unremarkable.  Will obtain CT  head and cervical spine.  Patient declines analgesia at this time.  He is taking his evening blood pressure medication.  I have asked him to hold his evening dose of Eliquis.   Clinical Course as of Jul 31 435  Sat Jul 30, 2020  0106 Patient resting no acute distress. Update patient and his wife regarding CT imaging results. Will discharge home with prescriptions for Norco and Valium to use as needed. Strict return precautions given. Both verbalized understanding agree with plan of care.   [JS]    Clinical Course User Index [JS] Irean Hong, MD     ____________________________________________   FINAL CLINICAL IMPRESSION(S) / ED DIAGNOSES  Final diagnoses:  Fall, initial encounter  Acute strain of neck muscle, initial encounter     ED Discharge Orders         Ordered    HYDROcodone-acetaminophen (NORCO) 5-325 MG tablet  Every 6 hours PRN        07/30/20 0108    diazepam (VALIUM) 2 MG tablet  Every 8 hours PRN        07/30/20 0108          *Please note:  Clinton Dorsey was evaluated in Emergency Department on 07/30/2020 for the symptoms described in the history of present illness. He was evaluated in the context of the  global COVID-19 pandemic, which necessitated consideration that the patient might be at risk for infection with the SARS-CoV-2 virus that causes COVID-19. Institutional protocols and algorithms that pertain to the evaluation of patients at risk for COVID-19 are in a state of rapid change based on information released by regulatory bodies including the CDC and federal and state organizations. These policies and algorithms were followed during the patient's care in the ED.  Some ED evaluations and interventions may be delayed as a result of limited staffing during and the pandemic.*   Note:  This document was prepared using Dragon voice recognition software and may include unintentional dictation errors.   Irean Hong, MD 07/30/20 [phone]

## 2020-07-29 NOTE — ED Notes (Signed)
Boneta Lucks from [facility] returned RN call and reports spoke to [employer] and was advised no test needed for work. Comp.

## 2020-07-30 ENCOUNTER — Emergency Department: Payer: No Typology Code available for payment source

## 2020-07-30 MED ORDER — HYDROCODONE-ACETAMINOPHEN 5-325 MG PO TABS: 1.0000 | ORAL_TABLET | Freq: Four times a day (QID) | ORAL | 0 refills | Status: DC | PRN

## 2020-07-30 MED ORDER — DIAZEPAM 2 MG PO TABS: 2.0000 mg | ORAL_TABLET | Freq: Three times a day (TID) | ORAL | 0 refills | Status: DC | PRN

## 2020-07-30 NOTE — Discharge Instructions (Signed)
1. You may take medicines as needed for pain and muscle spasms (Norco/Valium #15). 2. You may apply ice or heat to affected area; whichever makes you feel better. 3. Return to the ER for worsening symptoms, persistent vomiting, difficulty breathing or other concerns.

## 2020-08-05 DIAGNOSIS — E1159 Type 2 diabetes mellitus with other circulatory complications: Secondary | ICD-10-CM | POA: Diagnosis not present

## 2020-08-05 DIAGNOSIS — I480 Paroxysmal atrial fibrillation: Secondary | ICD-10-CM | POA: Diagnosis not present

## 2020-08-05 DIAGNOSIS — I10 Essential (primary) hypertension: Secondary | ICD-10-CM | POA: Diagnosis not present

## 2020-08-05 DIAGNOSIS — I2510 Atherosclerotic heart disease of native coronary artery without angina pectoris: Secondary | ICD-10-CM | POA: Diagnosis not present

## 2020-08-16 NOTE — Progress Notes (Signed)
Cardiology Office Note  Date:  08/17/2020   ID:  Clinton Dorsey, DOB [birth year], MRN [id number]  PCP:  [name], MD   Chief Complaint  Patient presents with  . Follow-up    6 Months follow up and discuss BP medication. Medications verbally reviewed with patient.     HPI:  [name] is a very pleasant 69 year-old gentleman with a history of hyperlipidemia,  hypertension,  diabetes, HBA1C 8.5 prior occasional tobacco abuse , quit presented to [facility] 12/07/2013 with chest pain. non-ST elevation MI  Stents to LCX, diagonal, distal RCA (staged) Echo 10/2015: normal EF, RVSP 40, mod MR PAF anxiety He presents today for follow-up of his coronary artery disease, atrial fib  Working at [employer],  Weight up 5 pounds, Reports he had breast swelling on spironolactone  144/80 at home sometimes, Does not really check it  On coreg 25 BID Hydralazine 50 TID, Diltiazem 120 daily Losartan 100 daily  He cut crestor to 10 for shoulder pain Was lifting weights at the time, could have been from strain  Lab work reviewed with him HBA1C 8.5 to 7.2 Total chol 143, LDL 77  EKG personally reviewed by myself on todays visit Shows normal sinus rhythm rate 59 bpm unable to exclude old anterior MI, left axis deviation, PACs  Other past medical history reviewed Recently seen in the emergency room July 20, 2019 for palpitations Started 8:30 in the morning watch told him he was in atrial fibrillation EKG in the emergency room showing atrial fibrillation Plavix was held Changed to Eliquis 5 twice daily Amlodipine changed to diltiazem Converted overnight to normal sinus rhythm  Previous Holter and stress test, Holter did show APCs, PVCs, idioventricular escape complexes, poor short run of SVT  Myoview stress test showing no ischemia  Cardiac catheterization was performed that showed severe three-vessel CAD. He had stent for distal RCA/ostial PL branch disease estimated at  99%. He also had mid circumflex with 99% disease, severe diagonal #1 disease. Attempt to place a stent to the mid circumflex was unsuccessful and given that he had significant dye and radiation exposure, it was recommended that he have PCI of his left circumflex and diagonal vessel at a later date. Followup catheterization at [place] with successful stents to his mid circumflex and first diagonal vessel   echocardiogram in the hospital showed ejection fraction 50-55%, mildly dilated left atrium, mild TR, low to moderate MR, no focal wall motion abnormalities noted   PMH:   has a past medical history of Arrhythmia, Coronary artery disease, Depression, Depression, Diabetes mellitus (HCC), Hyperlipidemia, Hypertension, Hypokalemia, Kidney stones, MI (myocardial infarction) (HCC) (11/2013), Mitral regurgitation, Obesity, and Tobacco abuse.  PSH:    Past Surgical History:  Procedure Laterality Date  . CARDIAC CATHETERIZATION  12/08/2013  . CORONARY ANGIOPLASTY  12/08/2013   s/p stent placement  . HERNIA REPAIR    . LAMINECTOMY    . PERCUTANEOUS CORONARY STENT INTERVENTION (PCI-S) N/A 12/23/2013   Procedure: PERCUTANEOUS CORONARY STENT INTERVENTION (PCI-S);  Surgeon: [name], MD;  Location: [facility] CATH LAB;  Service: Cardiovascular;  Laterality: N/A;  . SPINE SURGERY  2014  . TONSILLECTOMY      Current Outpatient Medications  Medication Sig Dispense Refill  . buPROPion (WELLBUTRIN XL) 150 MG 24 hr tablet Take 1 tablet (150 mg total) by mouth daily. 30 tablet 0  . carvedilol (COREG) 25 MG tablet Take 25 mg by mouth 2 (two) times daily.    . Coenzyme Q10  10 MG capsule Take 10 mg by mouth daily.     Marland Kitchen diltiazem (CARDIZEM CD) 120 MG 24 hr capsule Take 1 capsule (120 mg total) by mouth in the morning and at bedtime. 180 capsule 2  . ELIQUIS 5 MG TABS tablet TAKE 1 TABLET(5 MG) BY MOUTH TWICE DAILY 180 tablet 1  . ezetimibe (ZETIA) 10 MG tablet TAKE 1 TABLET(10 MG) BY MOUTH DAILY 90 tablet  0  . Glucose Blood (BLOOD GLUCOSE TEST STRIPS) STRP 1 strip by In Vitro route daily. 100 each 12  . hydrALAZINE (APRESOLINE) 50 MG tablet Take 50 mg by mouth 3 (three) times daily.     Marland Kitchen losartan (COZAAR) 100 MG tablet TAKE 1 TABLET(100 MG) BY MOUTH DAILY 90 tablet 0  . magnesium oxide (MAG-OX) 400 MG tablet Take 400 mg by mouth daily.    . metFORMIN (GLUCOPHAGE) 500 MG tablet Take 500 mg by mouth daily with breakfast.     . Multiple Vitamin (MULTIVITAMIN) capsule Take 1 capsule by mouth daily.    . rosuvastatin (CRESTOR) 10 MG tablet Take by mouth daily.     . sildenafil (REVATIO) 20 MG tablet TAKE 1-5 TABLETS BY MOUTH DAILY AS NEEDED 90 tablet 0   No current facility-administered medications for this visit.     Allergies:   Penicillin g, Atorvastatin, Penicillins, Simvastatin, and Crestor [rosuvastatin calcium]   Social History:  The patient  reports that he quit smoking about 6 years ago. His smoking use included cigars. He has never used smokeless tobacco. He reports that he does not drink alcohol and does not use drugs.   Family History:   family history includes Heart attack (age of onset: 30) in his father; Heart disease in his father; Hypertension in his mother and sister.    Review of Systems: Review of Systems  Constitutional: Negative.   HENT: Negative.   Respiratory: Negative.   Cardiovascular: Negative.   Gastrointestinal: Negative.   Musculoskeletal: Negative.   Neurological: Negative.   Psychiatric/Behavioral: Negative.   All other systems reviewed and are negative.   PHYSICAL EXAM: VS:  BP (!) 148/98 (BP Location: Left Arm, Patient Position: Sitting, Cuff Size: Normal)   Pulse (!) 59   Ht 5\' 6"  (1.676 m)   Wt 180 lb (81.6 kg)   SpO2 97%   BMI 29.05 kg/m  , BMI Body mass index is 29.05 kg/m. Constitutional:  oriented to person, place, and time. No distress.  HENT:  Head: Grossly normal Eyes:  no discharge. No scleral icterus.  Neck: No JVD, no carotid  bruits  Cardiovascular: Regular rate and rhythm, no murmurs appreciated Pulmonary/Chest: Clear to auscultation bilaterally, no wheezes or rails Abdominal: Soft.  no distension.  no tenderness.  Musculoskeletal: Normal range of motion Neurological:  normal muscle tone. Coordination normal. No atrophy Skin: Skin warm and dry Psychiatric: normal affect, pleasant  Recent Labs: 07/29/2020: BUN 26; Creatinine, Ser 1.16; Hemoglobin 13.5; Platelets 249; Potassium 3.9; Sodium 136    Lipid Panel Lab Results  Component Value Date   CHOL 121 11/24/2018   HDL 39 (L) 11/24/2018   LDLCALC 55 11/24/2018   TRIG 133 11/24/2018      Wt Readings from Last 3 Encounters:  08/17/20 180 lb (81.6 kg)  07/29/20 175 lb (79.4 kg)  01/18/20 177 lb 4 oz (80.4 kg)      ASSESSMENT AND PLAN:  Atherosclerosis of native coronary artery of native heart with stable angina pectoris (HCC) - Plan: EKG 12-Lead Currently with no  symptoms of angina. No further workup at this time. Continue current medication regimen.  NSTEMI (non-ST elevated myocardial infarction) (HCC) - On aspirin, no anginal symptoms No further work-up at this time  Essential hypertension - Plan: EKG 12-Lead Improvement in pressures compared to prior clinic visit Mildly anxious on today's visit, Recommend he continue current doses of carvedilol 25 twice daily hydralazine 50mg  3 times daily diltiazem extended release 120 daily with losartan 100 Bradycardia on today's visit but asymptomatic He is not checking blood pressure at home, recommend he call with some numbers His blood pressure adjustment would be increasing hydralazine to avoid worsening bradycardia We will try to avoid adding additional agents.  He is okay with taking hydralazine 3 times daily  Paroxysmal atrial fibrillation Eliquis up to 5 twice a day On carvedilol and diltiazem maintaining normal sinus rhythm Previous on diltiazem 120 twice daily now on daily, asymptomatic  bradycardia, will not increase  Mixed hyperlipidemia - Plan: EKG 12-Lead Stay on Crestor Zetia,  He did cut back on his Crestor from 20 mg to 10, was having shoulder pain but was lifting weights at the time, he will retry a 20 mg  Poorly controlled type 2 diabetes mellitus with circulatory disorder (HCC) - Hemoglobin A1c 8.5 numbers down to 7.2 Discussed walking program, diet program Discussed diet his wife is taking which is expensive  Tobacco abuse -  Reports that he stop smoking Stressed importance of smoking cessation  Long discussion with him concerning issues detailed above, cholesterol management, coronary disease, blood pressure management, stress, exercise, diabetes  Total encounter time more than 35 minutes  Greater than 50% was spent in counseling and coordination of care with the patient     No orders of the defined types were placed in this encounter.    Signed, [place], M.D., Ph.D. 08/17/2020  [facility] [place], [place]

## 2020-08-17 ENCOUNTER — Encounter: Payer: Self-pay | Admitting: Cardiovascular Disease

## 2020-08-17 ENCOUNTER — Other Ambulatory Visit: Payer: Self-pay

## 2020-08-17 ENCOUNTER — Ambulatory Visit (INDEPENDENT_AMBULATORY_CARE_PROVIDER_SITE_OTHER): Payer: BC Managed Care – PPO | Admitting: Cardiovascular Disease

## 2020-08-17 VITALS — BP 148/98 | HR 59 | Ht 66.0 in | Wt 180.0 lb

## 2020-08-17 DIAGNOSIS — I480 Paroxysmal atrial fibrillation: Secondary | ICD-10-CM

## 2020-08-17 DIAGNOSIS — E782 Mixed hyperlipidemia: Secondary | ICD-10-CM | POA: Diagnosis not present

## 2020-08-17 DIAGNOSIS — I25118 Atherosclerotic heart disease of native coronary artery with other forms of angina pectoris: Secondary | ICD-10-CM | POA: Diagnosis not present

## 2020-08-17 DIAGNOSIS — I340 Nonrheumatic mitral (valve) insufficiency: Secondary | ICD-10-CM

## 2020-08-17 DIAGNOSIS — E1165 Type 2 diabetes mellitus with hyperglycemia: Secondary | ICD-10-CM

## 2020-08-17 DIAGNOSIS — E119 Type 2 diabetes mellitus without complications: Secondary | ICD-10-CM | POA: Diagnosis not present

## 2020-08-17 DIAGNOSIS — I10 Essential (primary) hypertension: Secondary | ICD-10-CM | POA: Diagnosis not present

## 2020-08-17 DIAGNOSIS — Z720 Tobacco use: Secondary | ICD-10-CM

## 2020-08-17 DIAGNOSIS — E1159 Type 2 diabetes mellitus with other circulatory complications: Secondary | ICD-10-CM

## 2020-08-17 NOTE — Patient Instructions (Signed)
Please monitor blood pressure , Call with numbers  Medication Instructions:  No changes  If you need a refill on your cardiac medications before your next appointment, please call your pharmacy.    Lab work: No new labs needed   If you have labs (blood work) drawn today and your tests are completely normal, you will receive your results only by: Marland Kitchen MyChart Message (if you have MyChart) OR . A paper copy in the mail If you have any lab test that is abnormal or we need to change your treatment, we will call you to review the results.   Testing/Procedures: No new testing needed   Follow-Up: At [facility], you and your health needs are our priority.  As part of our continuing mission to provide you with exceptional heart care, we have created designated Provider Care Teams.  These Care Teams include your primary Cardiologist (physician) and Advanced Practice Providers (APPs -  Physician Assistants and Nurse Practitioners) who all work together to provide you with the care you need, when you need it.  . You will need a follow up appointment in 6 months  . Providers on your designated Care Team:   . Nicolasa Ducking, NP . Eula Listen, PA-C . Marisue Ivan, PA-C  Any Other Special Instructions Will Be Listed Below (If Applicable).  COVID-19 Vaccine Information can be found at: [email or web address] For questions related to vaccine distribution or appointments, please email [email or web address] or call [phone].

## 2020-09-07 ENCOUNTER — Observation Stay
Admission: EM | Admit: 2020-09-07 | Discharge: 2020-09-08 | Disposition: A | Discharge status: Home / Self Care | Payer: BC Managed Care – PPO | Attending: Family Medicine | Admitting: Family Medicine

## 2020-09-07 ENCOUNTER — Inpatient Hospital Stay (HOSPITAL_BASED_OUTPATIENT_CLINIC_OR_DEPARTMENT_OTHER)
Admit: 2020-09-07 | Discharge: 2020-09-07 | Disposition: A | Discharge status: Home / Self Care | Payer: BC Managed Care – PPO | Attending: Cardiology | Admitting: Cardiology

## 2020-09-07 ENCOUNTER — Other Ambulatory Visit: Payer: Self-pay

## 2020-09-07 ENCOUNTER — Encounter: Payer: Self-pay | Admitting: Emergency Medicine

## 2020-09-07 DIAGNOSIS — Z79899 Other long term (current) drug therapy: Secondary | ICD-10-CM | POA: Insufficient documentation

## 2020-09-07 DIAGNOSIS — I25119 Atherosclerotic heart disease of native coronary artery with unspecified angina pectoris: Secondary | ICD-10-CM | POA: Diagnosis not present

## 2020-09-07 DIAGNOSIS — I2510 Atherosclerotic heart disease of native coronary artery without angina pectoris: Secondary | ICD-10-CM | POA: Diagnosis not present

## 2020-09-07 DIAGNOSIS — Z20822 Contact with and (suspected) exposure to covid-19: Secondary | ICD-10-CM | POA: Diagnosis not present

## 2020-09-07 DIAGNOSIS — I4891 Unspecified atrial fibrillation: Secondary | ICD-10-CM | POA: Diagnosis not present

## 2020-09-07 DIAGNOSIS — I10 Essential (primary) hypertension: Secondary | ICD-10-CM | POA: Diagnosis present

## 2020-09-07 DIAGNOSIS — R079 Chest pain, unspecified: Secondary | ICD-10-CM

## 2020-09-07 DIAGNOSIS — Z87891 Personal history of nicotine dependence: Secondary | ICD-10-CM | POA: Insufficient documentation

## 2020-09-07 DIAGNOSIS — R002 Palpitations: Secondary | ICD-10-CM

## 2020-09-07 DIAGNOSIS — E119 Type 2 diabetes mellitus without complications: Secondary | ICD-10-CM | POA: Diagnosis not present

## 2020-09-07 LAB — CBG MONITORING, ED: Glucose-Capillary: 221 mg/dL — ABNORMAL HIGH (ref 70–99)

## 2020-09-07 LAB — BASIC METABOLIC PANEL
Anion gap: 12 (ref 5–15)
BUN: 23 mg/dL (ref 8–23)
CO2: 25 mmol/L (ref 22–32)
Calcium: 9.1 mg/dL (ref 8.9–10.3)
Chloride: 97 mmol/L — ABNORMAL LOW (ref 98–111)
Creatinine, Ser: 1.12 mg/dL (ref 0.61–1.24)
GFR, Estimated: >60 mL/min (ref >60)
Glucose, Bld: 128 mg/dL — ABNORMAL HIGH (ref 70–99)
Potassium: 3.7 mmol/L (ref 3.5–5.1)
Sodium: 134 mmol/L — ABNORMAL LOW (ref 135–145)

## 2020-09-07 LAB — RESP PANEL BY RT-PCR (FLU A&B, COVID) ARPGX2
Influenza A by PCR: NEGATIVE
Influenza B by PCR: NEGATIVE
SARS Coronavirus 2 by RT PCR: NEGATIVE

## 2020-09-07 LAB — ECHOCARDIOGRAM COMPLETE
AR max vel: 1.48 cm2
AV Area VTI: 1.51 cm2
AV Area mean vel: 1.38 cm2
AV Mean grad: 5.3 mmHg
AV Peak grad: 9.3 mmHg
Ao pk vel: 1.52 m/s
Area-P 1/2: 3.31 cm2
Height: 66 in
S' Lateral: 3.08 cm
Weight: 2768 oz

## 2020-09-07 LAB — CBC
HCT: 37.1 % — ABNORMAL LOW (ref 39.0–52.0)
Hemoglobin: 12.5 g/dL — ABNORMAL LOW (ref 13.0–17.0)
MCH: 31.3 pg (ref 26.0–34.0)
MCHC: 33.7 g/dL (ref 30.0–36.0)
MCV: 92.8 fL (ref 80.0–100.0)
Platelets: 261 10*3/uL (ref 150–400)
RBC: 4 MIL/uL — ABNORMAL LOW (ref 4.22–5.81)
RDW: 11.9 % (ref 11.5–15.5)
WBC: 7.4 10*3/uL (ref 4.0–10.5)
nRBC: 0 % (ref 0.0–0.2)

## 2020-09-07 LAB — TROPONIN I (HIGH SENSITIVITY)
Troponin I (High Sensitivity): 18 ng/L — ABNORMAL HIGH (ref <18)
Troponin I (High Sensitivity): 16 ng/L (ref <18)
Troponin I (High Sensitivity): 17 ng/L (ref <18)

## 2020-09-07 LAB — HEMOGLOBIN A1C
Hgb A1c MFr Bld: 6.4 % — ABNORMAL HIGH (ref 4.8–5.6)
Mean Plasma Glucose: 136.98 mg/dL

## 2020-09-07 LAB — HIV ANTIBODY (ROUTINE TESTING W REFLEX): HIV Screen 4th Generation wRfx: NONREACTIVE

## 2020-09-07 LAB — TSH: TSH: 1.782 u[IU]/mL (ref 0.350–4.500)

## 2020-09-07 MED ORDER — DILTIAZEM HCL-DEXTROSE 125-5 MG/125ML-% IV SOLN (PREMIX): 5.0000 mg/h | INTRAVENOUS | Status: DC

## 2020-09-07 MED ORDER — APIXABAN 5 MG PO TABS
5.0000 mg | ORAL_TABLET | Freq: Two times a day (BID) | ORAL | Status: DC
  Administered 2020-09-07 – 2020-09-08 (×2): 5 mg via ORAL
  Filled 2020-09-07 (×2): qty 1

## 2020-09-07 MED ORDER — DILTIAZEM HCL ER COATED BEADS 120 MG PO CP24: 120.0000 mg | ORAL_CAPSULE | Freq: Every day | ORAL | Status: DC

## 2020-09-07 MED ORDER — VITAMIN D 25 MCG (1000 UNIT) PO TABS
1000.0000 [IU] | ORAL_TABLET | Freq: Every day | ORAL | Status: DC
  Administered 2020-09-08: 1000 [IU] via ORAL
  Filled 2020-09-07: qty 1

## 2020-09-07 MED ORDER — CARVEDILOL 25 MG PO TABS
25.0000 mg | ORAL_TABLET | Freq: Two times a day (BID) | ORAL | Status: DC
  Administered 2020-09-07 – 2020-09-08 (×2): 25 mg via ORAL
  Filled 2020-09-07 (×2): qty 1

## 2020-09-07 MED ORDER — MAGNESIUM OXIDE 400 (241.3 MG) MG PO TABS
400.0000 mg | ORAL_TABLET | Freq: Every day | ORAL | Status: DC
  Administered 2020-09-08: 400 mg via ORAL
  Filled 2020-09-07: qty 1

## 2020-09-07 MED ORDER — LOSARTAN POTASSIUM 50 MG PO TABS: 100.0000 mg | ORAL_TABLET | Freq: Every day | ORAL | Status: DC

## 2020-09-07 MED ORDER — APIXABAN 5 MG PO TABS: 5.0000 mg | ORAL_TABLET | Freq: Two times a day (BID) | ORAL | Status: DC

## 2020-09-07 MED ORDER — SODIUM CHLORIDE 0.9 % IV BOLUS
1000.0000 mL | Freq: Once | INTRAVENOUS | Status: AC
  Administered 2020-09-07: 1000 mL via INTRAVENOUS

## 2020-09-07 MED ORDER — SODIUM CHLORIDE 0.9 % IV SOLN: 250.0000 mL | INTRAVENOUS | Status: DC | PRN

## 2020-09-07 MED ORDER — DILTIAZEM HCL 60 MG PO TABS
60.0000 mg | ORAL_TABLET | Freq: Three times a day (TID) | ORAL | Status: DC
  Administered 2020-09-07 – 2020-09-08 (×3): 60 mg via ORAL
  Filled 2020-09-07 (×3): qty 1

## 2020-09-07 MED ORDER — ONDANSETRON HCL 4 MG/2ML IJ SOLN: 4.0000 mg | Freq: Four times a day (QID) | INTRAMUSCULAR | Status: DC | PRN

## 2020-09-07 MED ORDER — LOSARTAN POTASSIUM 50 MG PO TABS
50.0000 mg | ORAL_TABLET | Freq: Every day | ORAL | Status: DC
  Administered 2020-09-08: 50 mg via ORAL
  Filled 2020-09-07: qty 1

## 2020-09-07 MED ORDER — DILTIAZEM HCL 60 MG PO TABS
30.0000 mg | ORAL_TABLET | ORAL | Status: DC
  Administered 2020-09-07: 30 mg via ORAL
  Filled 2020-09-07: qty 1

## 2020-09-07 MED ORDER — DILTIAZEM HCL-DEXTROSE 125-5 MG/125ML-% IV SOLN (PREMIX)
5.0000 mg/h | INTRAVENOUS | Status: DC
  Administered 2020-09-07: 5 mg/h via INTRAVENOUS
  Filled 2020-09-07: qty 125

## 2020-09-07 MED ORDER — SODIUM CHLORIDE 0.9% FLUSH: 3.0000 mL | INTRAVENOUS | Status: DC | PRN

## 2020-09-07 MED ORDER — INSULIN ASPART 100 UNIT/ML ~~LOC~~ SOLN: 0.0000 [IU] | Freq: Three times a day (TID) | SUBCUTANEOUS | Status: DC

## 2020-09-07 MED ORDER — ACETAMINOPHEN 325 MG PO TABS: 650.0000 mg | ORAL_TABLET | ORAL | Status: DC | PRN

## 2020-09-07 MED ORDER — BUPROPION HCL ER (XL) 150 MG PO TB24
150.0000 mg | ORAL_TABLET | Freq: Every day | ORAL | Status: DC
  Administered 2020-09-08: 150 mg via ORAL
  Filled 2020-09-07: qty 1

## 2020-09-07 MED ORDER — ADULT MULTIVITAMIN W/MINERALS CH
1.0000 | ORAL_TABLET | Freq: Every day | ORAL | Status: DC
  Administered 2020-09-08: 1 via ORAL
  Filled 2020-09-07: qty 1

## 2020-09-07 MED ORDER — ROSUVASTATIN CALCIUM 20 MG PO TABS
20.0000 mg | ORAL_TABLET | Freq: Every evening | ORAL | Status: DC
  Administered 2020-09-07: 20 mg via ORAL
  Filled 2020-09-07 (×3): qty 1

## 2020-09-07 MED ORDER — SODIUM CHLORIDE 0.9% FLUSH
3.0000 mL | Freq: Two times a day (BID) | INTRAVENOUS | Status: DC
  Administered 2020-09-07 – 2020-09-08 (×3): 3 mL via INTRAVENOUS

## 2020-09-07 MED ORDER — EZETIMIBE 10 MG PO TABS
10.0000 mg | ORAL_TABLET | Freq: Every day | ORAL | Status: DC
  Administered 2020-09-08: 10 mg via ORAL
  Filled 2020-09-07: qty 1

## 2020-09-07 NOTE — ED Triage Notes (Signed)
Pt reports woke up this am and was sweating and he put his watch on and it showed an irregular heart rate nd a-fib. Pt reports  hx of the same in the past once and takes eloquis

## 2020-09-07 NOTE — Progress Notes (Signed)
*  PRELIMINARY RESULTS* Echocardiogram 2D Echocardiogram has been performed.  Cristela Blue 09/07/2020, 2:30 PM

## 2020-09-07 NOTE — ED Notes (Signed)
Echo at bedside

## 2020-09-07 NOTE — ED Provider Notes (Signed)
[facility] Emergency Department Provider Note   ____________________________________________   Event Date/Time   First MD Initiated Contact with Patient 09/07/20 [phone]     (approximate)  I have reviewed the triage vital signs and the nursing notes.   HISTORY  Chief Complaint Chest Pain and Irregular Heart Beat    HPI Clinton Dorsey is a 69 y.o. male with a stated past medical history of CAD status post 3 stents, hypertension, paroxysmal atrial fibrillation, and hyperlipidemia who presents for palpitations, chest pressure, and irregular heartbeat as found by his fitness watch.  Patient states that he woke up this morning at approximately 2-3 AM with palpitations and diaphoresis.  Patient states that he tried to go back to sleep but was awoken approximately 3 hours later with worsening palpitations and tachycardia on his fitness watch that read atrial fibrillation.  Patient has had stable symptoms since that time and only describes 1/10 central chest pressure that does not radiate.  Patient states that he is on diltiazem as well as Eliquis for his history of paroxysmal atrial fibrillation prescribed by Dr. Mariah Milling in cardiology.  Patient currently denies any vision changes, tinnitus, difficulty speaking, facial droop, sore throat, shortness of breath, abdominal pain, nausea/vomiting/diarrhea, dysuria, or weakness/numbness/paresthesias in any extremity         Past Medical History:  Diagnosis Date  . Arrhythmia   . Coronary artery disease    a. NSTEMI; b. 12/23/13 cath 11/2013: D1 80%, LCx 90% failed PCI, dRCA 99%, PL branch 99% s/p PCI/DES,  EF 55%; c. staged cardiac cath 12/2013: s/p PCI/DES to both D1 and mLCx, resdial LM 40%, ostial LCx 50%  . Depression   . Depression   . Diabetes mellitus (HCC)    a. poorly controlled  . Hyperlipidemia   . Hypertension   . Hypokalemia    a. recurrent  . Kidney stones   . MI (myocardial infarction) (HCC) 11/2013  .  Mitral regurgitation    a. echo 11/2013: EF 55-60%, mild LVH, mildly dilated LA, mild TR, mild to mod MR, mildly elevated RVSP    . Obesity   . Tobacco abuse     Patient Active Problem List   Diagnosis Date Noted  . Pulmonary nodule 07/20/2019  . Recurrent major depressive disorder, in partial remission (HCC) 11/24/2018  . Advanced care planning/counseling discussion 05/27/2017  . Erectile dysfunction 07/10/2016  . Acute anxiety 12/15/2015  . Mitral regurgitation   . Tobacco abuse   . Depression   . Poorly controlled type 2 diabetes mellitus with circulatory disorder (HCC) 06/03/2015  . Angina pectoris (HCC)   . Hypokalemia   . Esophageal spasm   . Coronary artery disease involving native coronary artery of native heart with angina pectoris with documented spasm (HCC)   . Hyperlipidemia   . NSTEMI (non-ST elevated myocardial infarction) (HCC) 12/14/2013  . Stented coronary artery - D1, mCx 12/14/2013  . Essential hypertension 12/14/2013  . Spinal stenosis of lumbar region without neurogenic claudication 10/24/2012    Past Surgical History:  Procedure Laterality Date  . CARDIAC CATHETERIZATION  12/08/2013  . CORONARY ANGIOPLASTY  12/08/2013   s/p stent placement  . HERNIA REPAIR    . LAMINECTOMY    . PERCUTANEOUS CORONARY STENT INTERVENTION (PCI-S) N/A 12/23/2013   Procedure: PERCUTANEOUS CORONARY STENT INTERVENTION (PCI-S);  Surgeon: [name], MD;  Location: [facility] CATH LAB;  Service: Cardiovascular;  Laterality: N/A;  . SPINE SURGERY  2014  . TONSILLECTOMY  Prior to Admission medications   Medication Sig Start Date End Date Taking? Authorizing Provider  buPROPion (WELLBUTRIN XL) 150 MG 24 hr tablet Take 1 tablet (150 mg total) by mouth daily. 12/14/19   Particia Nearing, PA-C  carvedilol (COREG) 25 MG tablet Take 25 mg by mouth 2 (two) times daily. 05/26/20   [provider]  Coenzyme Q10 10 MG capsule Take 10 mg by mouth daily.     [provider]  diltiazem (CARDIZEM CD) 120 MG 24 hr capsule Take 1 capsule (120 mg total) by mouth in the morning and at bedtime. 01/13/20 01/12/21  Antonieta Iba, MD  ELIQUIS 5 MG TABS tablet TAKE 1 TABLET(5 MG) BY MOUTH TWICE DAILY 06/03/20   Antonieta Iba, MD  ezetimibe (ZETIA) 10 MG tablet TAKE 1 TABLET(10 MG) BY MOUTH DAILY 07/04/20   Antonieta Iba, MD  Glucose Blood (BLOOD GLUCOSE TEST STRIPS) STRP 1 strip by In Vitro route daily. 11/24/18   Steele Sizer, MD  hydrALAZINE (APRESOLINE) 50 MG tablet Take 50 mg by mouth 3 (three) times daily.  08/05/20 08/05/21  [provider]  losartan (COZAAR) 100 MG tablet TAKE 1 TABLET(100 MG) BY MOUTH DAILY 07/04/20   Antonieta Iba, MD  magnesium oxide (MAG-OX) 400 MG tablet Take 400 mg by mouth daily.    [provider]  metFORMIN (GLUCOPHAGE) 500 MG tablet Take 500 mg by mouth daily with breakfast.  01/11/20   [provider]  Multiple Vitamin (MULTIVITAMIN) capsule Take 1 capsule by mouth daily.    [provider]  rosuvastatin (CRESTOR) 10 MG tablet Take by mouth daily.     [provider]  sildenafil (REVATIO) 20 MG tablet TAKE 1-5 TABLETS BY MOUTH DAILY AS NEEDED 12/15/19   Antonieta Iba, MD    Allergies Penicillins, Atorvastatin, Crestor [rosuvastatin calcium], and Simvastatin  Family History  Problem Relation Age of Onset  . Heart attack Father 20       MI  . Heart disease Father        CABG  . Hypertension Mother   . Hypertension Sister     Social History Social History   Tobacco Use  . Smoking status: Former Smoker    Types: Cigars    Quit date: 12/08/2013    Years since quitting: 6.7  . Smokeless tobacco: Never Used  Substance Use Topics  . Alcohol use: No    Alcohol/week: 1.0 standard drink    Types: 1 Glasses of wine per week    Comment: occasional  . Drug use: No    Review of Systems Constitutional: No fever/chills Eyes: No visual changes. ENT: No sore  throat. Cardiovascular: Endorses chest pain. Respiratory: Denies shortness of breath. Gastrointestinal: No abdominal pain.  No nausea, no vomiting.  No diarrhea. Genitourinary: Negative for dysuria. Musculoskeletal: Negative for acute arthralgias Skin: Negative for rash. Neurological: Negative for headaches, weakness/numbness/paresthesias in any extremity Psychiatric: Negative for suicidal ideation/homicidal ideation   ____________________________________________   PHYSICAL EXAM:  VITAL SIGNS: ED Triage Vitals  Enc Vitals Group     BP 09/07/20 0907 (!) 156/106     Pulse Rate 09/07/20 0907 (!) 126     Resp 09/07/20 0907 16     Temp 09/07/20 0907 97.6 F (36.4 C)     Temp Source 09/07/20 0907 Oral     SpO2 09/07/20 0907 96 %     Weight 09/07/20 0900 173 lb (78.5 kg)     Height 09/07/20  0900 5\' 6"  (1.676 m)     Head Circumference --      Peak Flow --      Pain Score 09/07/20 0900 0     Pain Loc --      Pain Edu? --      Excl. in GC? --    Constitutional: Alert and oriented. Well appearing and in no acute distress. Eyes: Conjunctivae are normal. PERRL. Head: Atraumatic. Nose: No congestion/rhinnorhea. Mouth/Throat: Mucous membranes are moist. Neck: No stridor Cardiovascular: Tachycardic irregularly irregular rhythm.  Grossly normal heart sounds.  Good peripheral circulation. Respiratory: Normal respiratory effort.  No retractions. Gastrointestinal: Soft and nontender. No distention. Musculoskeletal: No obvious deformities Neurologic:  Normal speech and language. No gross focal neurologic deficits are appreciated. Skin:  Skin is warm and dry. No rash noted. Psychiatric: Mood and affect are normal. Speech and behavior are normal.  ____________________________________________   LABS (all labs ordered are listed, but only abnormal results are displayed)  Labs Reviewed  BASIC METABOLIC PANEL - Abnormal; Notable for the following components:      Result Value   Sodium  134 (*)    Chloride 97 (*)    Glucose, Bld 128 (*)    All other components within normal limits  CBC - Abnormal; Notable for the following components:   RBC 4.00 (*)    Hemoglobin 12.5 (*)    HCT 37.1 (*)    All other components within normal limits  TROPONIN I (HIGH SENSITIVITY) - Abnormal; Notable for the following components:   Troponin I (High Sensitivity) 18 (*)    All other components within normal limits  RESP PANEL BY RT-PCR (FLU A&B, COVID) ARPGX2  TROPONIN I (HIGH SENSITIVITY)   ____________________________________________  EKG  ED ECG REPORT I, 09/09/20, the attending physician, personally viewed and interpreted this ECG.  Date: 09/07/2020 EKG Time: 0854 Rate: 119 Rhythm: Atrial fibrillation with rapid ventricular response QRS Axis: normal Intervals: normal ST/T Wave abnormalities: normal Narrative Interpretation: no evidence of acute ischemia   PROCEDURES  Procedure(s) performed (including Critical Care):  .1-3 Lead EKG Interpretation Performed by: 09/09/2020, MD Authorized by: Merwyn Katos, MD     Interpretation: abnormal     ECG rate:  90   ECG rate assessment: normal     Rhythm: atrial fibrillation     Ectopy: none     Conduction: normal   .Critical Care Performed by: Merwyn Katos, MD Authorized by: Merwyn Katos, MD   Critical care provider statement:    Critical care time (minutes):  19   Critical care time was exclusive of:  Separately billable procedures and treating other patients   Critical care was necessary to treat or prevent imminent or life-threatening deterioration of the following conditions:  Cardiac failure   Critical care was time spent personally by me on the following activities:  Discussions with consultants, evaluation of patient's response to treatment, examination of patient, ordering and performing treatments and interventions, ordering and review of laboratory studies, ordering and review of radiographic  studies, pulse oximetry, re-evaluation of patient's condition, obtaining history from patient or surrogate, review of old charts and development of treatment plan with patient or surrogate   I assumed direction of critical care for this patient from another provider in my specialty: no     Care discussed with: admitting provider       ____________________________________________   INITIAL IMPRESSION / ASSESSMENT AND PLAN / ED COURSE  As part of  my medical decision making, I reviewed the following data within the electronic MEDICAL RECORD NUMBER Nursing notes reviewed and incorporated, Labs reviewed, EKG interpreted, Old chart reviewed, and Notes from prior ED visits reviewed and incorporated        + atrial fibrillation w/ RVR DDx: Pneumothorax, Pneumonia, Pulmonary Embolus, Tamponade, ACS, Thyrotoxicosis.  No history or evidence decompensated heart failure. Given their history and exam it is likely this patient is unlikely to spontaneously revert to a rate controlled rhythm and necessitates a thorough workup for their arrhythmia. Workup: ECG, CXR, CBC, BMP, UA, Troponin, BNP, TSH, Ca-Mag-Phos Interventions: Defer Cardioversion (uncertain historical reliability with time of onset, increased risk of thromboembolic stroke).  Start diltiazem bolus and drip  Disposition: Admit      ____________________________________________   FINAL CLINICAL IMPRESSION(S) / ED DIAGNOSES  Final diagnoses:  Atrial fibrillation with RVR (HCC)  Chest pain, unspecified type  Palpitations     ED Discharge Orders    None       Note:  This document was prepared using Dragon voice recognition software and may include unintentional dictation errors.   Merwyn Katos, MD 09/07/20 [phone]

## 2020-09-07 NOTE — Consult Note (Addendum)
Cardiology Consultation:   Patient ID: Clinton Dorsey MRN: [id number]; DOB: 1951-07-02  Admit date: 09/07/2020 Date of Consult: 09/07/2020  Primary Care Provider: [name], MD [facility] Cardiologist: [name], MD  [facility] Electrophysiologist:  None    Patient Profile:   Clinton Dorsey is a 69 y.o. male with a hx of CAD s/p 2015 non-STEMI with DES to left circumflex/diagonal/distal RCA in 2015, hypertension, hyperlipidemia, DM2, prior tobacco use, paroxysmal atrial fibrillation on Eliquis, and anxiety who is being seen today for the evaluation of afib with RVR at the request of Dr. Joylene Igo.  History of Present Illness:   Clinton Dorsey is a 69 year old male with PMH as above.  He has history of previous tobacco use.  He is followed in our clinic at heart care by Dr. Mariah Milling.  He has history of CAD s/p 2015 non-STEMI with subsequent LHC and staged DES placement to his left circumflex, diagonal, and distal RCA after presenting to [facility] with chest pain. 2017 echo showed normal EF, RVSP 40, moderate MR.  He has h/o atrial fibrillation, after presenting to [facility] with palpitations.  EKG showed new onset atrial fibrillation with RVR.  He was started on Eliquis 5 mg twice daily and diltiazem.  He was last seen in clinic by Dr. Mariah Milling 08/17/2020 for 63-month follow-up and to discuss his blood pressure medication.  Home BP was normal consistently checked but noted to be 144/80 at times.  His clinic BP was noted to be improved.  Notes indicate he had breast swelling on spironolactone with it discontinued.  He was continued on carvedilol, hydralazine diltiazem, and losartan.  Home BP checks were recommended.  Notes indicates plan was to avoid further escalation of diltiazem and carvedilol due to his asx bradycardia and instead increase hydralazine if BP remained elevated at RTC.  He had cut his Crestor to 10 mg for shoulder pain without improvement and was increased back to Crestor  20 mg.    Today, he reports recently starting hydralazine.  However, on review of his EMR, it does appear as if he has been on this medication for some time.  He recently started working at [employer] and reports that he has been very busy recently.  He also reports recent stress of the upcoming holidays.  He also reports a new diet, which has helped to improve his blood sugar.  He recently discontinued Metformin, due to daily diarrhea.  He reports that his diarrhea has improved.  And is now still ongoing but more consistent with loose stools. No s/sx consistent with GIB.  He reports he walks for activity and has not lately been lifting weights or working out as regularly as in the past.  Per wife, he has had a consistent cough without clear triggers over the last few months with occasional clear sputum.  As below, no other signs or symptoms of volume overload overload.  Last night he reports drinking 2 glasses of wine.   This morning, 09/07/2020, he woke up at approximately 2:58 AM and felt diaphoretic.  He checked his heart rate with his wrist watch and noted rates in the 80s.  Went back to sleep but woke again later in the AM with rates 110-120s.  He reports usual resting heart rate in the 50s.  Approximately the same time, he also noted indigestion.  He is joined today by his wife, who notes her concern regarding his indigestion as this is similar symptoms to that before his 69 NSTEMI.  Indigestion  was described as a vague feeling in the center of his chest that has since resolved resolved and not recurred. He reports that, due to his elevated rates, he knew he was back in atrial fibrillation.  He was symptomatic with report of racing heart rate and possible SOB though described more as a vague feeling of not feeling well. He checked his blood sugar at home and noted it was in the 70s. No s/sx of worsening volume status. He reports medication compliance, including anticoagulation. He reports  only one known episode of atrial fibrillation without recurrence until now. Of note, previous monitor as under CV tab with 2017 SVT and ectopy.  No s/sx of bleeding reported.  He decided to present to [facility]. In the ED, initial vitals significant for ventricular rate 126, BP 156/106, 96% on room air.  Chest x-ray without active cardiopulmonary disease.  Labs showed sodium 134, potassium 3.7, glucose 128, creatinine 1.12 and within normal baseline window, BUN 23, hemoglobin 12.5, RBC 4.0, hematocrit 37.1, HS Tn 18.  Past Medical History:  Diagnosis Date  . Arrhythmia   . Coronary artery disease    a. NSTEMI; b. 12/23/13 cath 11/2013: D1 80%, LCx 90% failed PCI, dRCA 99%, PL branch 99% s/p PCI/DES,  EF 55%; c. staged cardiac cath 12/2013: s/p PCI/DES to both D1 and mLCx, resdial LM 40%, ostial LCx 50%  . Depression   . Depression   . Diabetes mellitus (HCC)    a. poorly controlled  . Hyperlipidemia   . Hypertension   . Hypokalemia    a. recurrent  . Kidney stones   . MI (myocardial infarction) (HCC) 11/2013  . Mitral regurgitation    a. echo 11/2013: EF 55-60%, mild LVH, mildly dilated LA, mild TR, mild to mod MR, mildly elevated RVSP    . Obesity   . Tobacco abuse     Past Surgical History:  Procedure Laterality Date  . CARDIAC CATHETERIZATION  12/08/2013  . CORONARY ANGIOPLASTY  12/08/2013   s/p stent placement  . HERNIA REPAIR    . LAMINECTOMY    . PERCUTANEOUS CORONARY STENT INTERVENTION (PCI-S) N/A 12/23/2013   Procedure: PERCUTANEOUS CORONARY STENT INTERVENTION (PCI-S);  Surgeon: [name], MD;  Location[facility] C[facility] LAB;  Service: Cardiovascular;  Laterality: N/A;  . SPINE SURGERY  2014  . TONSILLECTOMY       Home Medications:  Prior to Admission medications   Medication Sig Start Date End Date Taking? Authorizing Provider  buPROPion (WELLBUTRIN XL) 150 MG 24 hr tablet Take 1 tablet (150 mg total) by mouth daily. 12/14/19   Particia Nearing, PA-C  carvedilol (COREG) 25  MG tablet Take 25 mg by mouth 2 (two) times daily. 05/26/20   [provider]  Coenzyme Q10 10 MG capsule Take 10 mg by mouth daily.     [provider]  diltiazem (CARDIZEM CD) 120 MG 24 hr capsule Take 1 capsule (120 mg total) by mouth in the morning and at bedtime. 01/13/20 01/12/21  Antonieta Iba, MD  ELIQUIS 5 MG TABS tablet TAKE 1 TABLET(5 MG) BY MOUTH TWICE DAILY 06/03/20   Antonieta Iba, MD  ezetimibe (ZETIA) 10 MG tablet TAKE 1 TABLET(10 MG) BY MOUTH DAILY 07/04/20   Antonieta Iba, MD  Glucose Blood (BLOOD GLUCOSE TEST STRIPS) STRP 1 strip by In Vitro route daily. 11/24/18   Steele Sizer, MD  hydrALAZINE (APRESOLINE) 50 MG tablet Take 50 mg by mouth 3 (three) times daily.  08/05/20  08/05/21  [provider]  losartan (COZAAR) 100 MG tablet TAKE 1 TABLET(100 MG) BY MOUTH DAILY 07/04/20   Antonieta Iba, MD  magnesium oxide (MAG-OX) 400 MG tablet Take 400 mg by mouth daily.    [provider]  metFORMIN (GLUCOPHAGE) 500 MG tablet Take 500 mg by mouth daily with breakfast.  01/11/20   [provider]  Multiple Vitamin (MULTIVITAMIN) capsule Take 1 capsule by mouth daily.    [provider]  rosuvastatin (CRESTOR) 10 MG tablet Take by mouth daily.     [provider]  sildenafil (REVATIO) 20 MG tablet TAKE 1-5 TABLETS BY MOUTH DAILY AS NEEDED 12/15/19   Antonieta Iba, MD    Inpatient Medications: Scheduled Meds: . apixaban  5 mg Oral BID  . apixaban  5 mg Oral BID  . buPROPion  150 mg Oral Daily  . carvedilol  25 mg Oral BID  . cholecalciferol  1,000 Units Oral Daily  . diltiazem  30 mg Oral Q4H  . [START ON 09/08/2020] ezetimibe  10 mg Oral Daily  . [START ON 09/08/2020] losartan  100 mg Oral Daily  . magnesium oxide  400 mg Oral Daily  . multivitamin  1 capsule Oral Daily  . rosuvastatin  20 mg Oral QPM  . sodium chloride flush  3 mL Intravenous Q12H   Continuous Infusions: . sodium chloride     PRN  Meds:   Allergies:    Allergies  Allergen Reactions  . Spironolactone Other (See Comments)    Gynomastia  . Penicillins Hives and Rash       . Atorvastatin Other (See Comments)    Myalgias   . Crestor [Rosuvastatin Calcium] Other (See Comments)    Myalgias   . Simvastatin Other (See Comments)    Myalgias    Social History:   Social History   Socioeconomic History  . Marital status: Married    Spouse name: Not on file  . Number of children: Not on file  . Years of education: Not on file  . Highest education level: Not on file  Occupational History  . Not on file  Tobacco Use  . Smoking status: Former Smoker    Types: Cigars    Quit date: 12/08/2013    Years since quitting: 6.7  . Smokeless tobacco: Never Used  Vaping Use  . Vaping Use: Not on file  Substance and Sexual Activity  . Alcohol use: No    Alcohol/week: 1.0 standard drink    Types: 1 Glasses of wine per week    Comment: occasional  . Drug use: No  . Sexual activity: Not on file  Other Topics Concern  . Not on file  Social History Narrative  . Not on file   Social Determinants of Health   Financial Resource Strain: Not on file  Food Insecurity: Not on file  Transportation Needs: Not on file  Physical Activity: Not on file  Stress: Not on file  Social Connections: Not on file  Intimate Partner Violence: Not on file    Family History:    Family History  Problem Relation Age of Onset  . Heart attack Father 64       MI  . Heart disease Father        CABG  . Hypertension Mother   . Hypertension Sister      ROS:  Please see the history of present illness.  Review of Systems  Constitutional: Positive for diaphoresis and malaise/fatigue.  Respiratory: Positive for cough and sputum production. Negative for hemoptysis, shortness of breath and wheezing.   Cardiovascular: Positive for chest pain and palpitations. Negative for orthopnea, claudication, leg swelling and PND.    All other ROS  reviewed and negative.     Physical Exam/Data:   Vitals:   09/07/20 0907 09/07/20 1000 09/07/20 1015 09/07/20 1021  BP: (!) 156/106 120/74    Pulse: (!) 126 (!) 104 96 90  Resp: 16 (!) 21 (!) 23 (!) 21  Temp: 97.6 F (36.4 C)     TempSrc: Oral     SpO2: 96% 95% 93% 92%  Weight:      Height:       No intake or output data in the 24 hours ending 09/07/20 1155 Last 3 Weights 09/07/2020 08/17/2020 07/29/2020  Weight (lbs) 173 lb 180 lb 175 lb  Weight (kg) 78.472 kg 81.647 kg 79.379 kg     Body mass index is 27.92 kg/m.  General:  Well nourished, well developed, in no acute distress.  Joined today by his wife. HEENT: normal Lymph: no adenopathy Neck: no JVD  Endocrine:  No thryomegaly Vascular: No carotid bruits; FA pulses 2+ bilaterally without bruits  Cardiac:  normal S1, S2; IRIR; 1/6 systolic murmur LLSB  Lungs:  clear to auscultation bilaterally, no wheezing, rhonchi or rales  Abd: soft, nontender, no hepatomegaly  Ext: trace bilateral edema Musculoskeletal:  No deformities, BUE and BLE strength normal and equal Skin: warm and dry  Neuro:  CNs 2-12 intact, no focal abnormalities noted Psych:  Normal affect   EKG:  The EKG was personally reviewed and demonstrates:  Atrial fibrillation with rapid ventricular response, 119 bpm, incomplete right bundle branch block, left axis deviation, PVCs, poor R wave pression in the anterolateral precordial leads. Telemetry:  Telemetry was personally reviewed and demonstrates:  Atrial fibrillation with rates into the low 110s, PVCs  Relevant CV Studies: 2017 Echo - Left ventricle: The cavity size was mildly dilated. There was  mild concentric hypertrophy. Systolic function was normal. The  estimated ejection fraction was in the range of 55% to 60%. Wall  motion was normal; there were no regional wall motion  abnormalities. Features are consistent with a pseudonormal left  ventricular filling pattern, with concomitant abnormal  relaxation  and increased filling pressure (grade 2 diastolic dysfunction).  - Mitral valve: There was moderate regurgitation.  - Left atrium: The atrium was moderately dilated.  - Pulmonary arteries: Systolic pressure was mildly increased. PA  peak pressure: 40 mm Hg (S).   Laboratory Data:  High Sensitivity Troponin:   Recent Labs  Lab 09/07/20 0907  TROPONINIHS 18*     Chemistry Recent Labs  Lab 09/07/20 0907  NA 134*  K 3.7  CL 97*  CO2 25  GLUCOSE 128*  BUN 23  CREATININE 1.12  CALCIUM 9.1  GFRNONAA >60  ANIONGAP 12    No results for input(s): PROT, ALBUMIN, AST, ALT, ALKPHOS, BILITOT in the last 168 hours. Hematology Recent Labs  Lab 09/07/20 0907  WBC 7.4  RBC 4.00*  HGB 12.5*  HCT 37.1*  MCV 92.8  MCH 31.3  MCHC 33.7  RDW 11.9  PLT 261   BNPNo results for input(s): BNP, PROBNP in the last 168 hours.  DDimer No results for input(s): DDIMER in the last 168 hours.   Radiology/Studies:  No results found.   Assessment and Plan:   Paroxysmal atrial fibrillation with rapid ventricular rate --Symptomatic in current Afib. Predominantly reports fatigue  and CP with further recommendations regarding CP as below.  Reports only 1 prior episode of atrial fibrillation (at time of diagnosis). --Started on IV diltiazem in the ED for rate control. Updated echo pending to reassess EF. Per MD, if EF nl on repeat echo, transition to oral Cardizem 30 mg every 4 hours or Cardizem 60mg  3 times daily with titration until rate controlled with consolidation at discharge and potential discharge with diltiazem 180mg  daily.  --Reports compliance with Eliquis 5 mg twice daily. Continue given CHA2DS2VASc score of at least 5 (HTN, agex1, G2DD, vascular, DM2).  Given he is symptomatic in atrial fibrillation and therapeutically anticoagulated, if he remains in A. Fib with sx or rates difficult to control, will plan for DCCV this admission. --Ideally, now or in the OP setting we  would wean him off of hydralazine as outlined below, given its potential for reflex tachycardia.  Discussed that alcohol is one of the triggers for atrial fibrillation. Discussed further triggers of Afib.  Chest pain with elevated HS Tn History of an STEMI/CAD s/p PCI --No current chest pain.  Reports previous chest pain that occurred in the setting of his elevated ventricular rate, described as indigestion and as similar to that 2015 NSTEMI /intervention.  High-sensitivity troponin 18 and still cycling.  EKG without acute ST/T changes.  Consider supply demand ischemia in the setting of is elevated ventricular rate, as well as elevated BP.  Given his risk factors for cardiac etiology, including previous history of smoking and known CAD with similar CP prior to PCI, recommend further work-up of chest pain.  Echo pending as above to reassess EF and wall motion with further recommendations at that time.  Tentative plan for stress testing tomorrow.  If MPI planned for tomorrow, n.p.o. after midnight.  Continue current medications.  No ASA in the setting of anticoagulation.  Continue Coreg and Cardizem.  PRN SL nitro for CP.  HTN --Continue current Cardizem, Coreg, losartan.  Titrate short acting Cardizem as needed for BP and heart rate control the potential consolidation to diltiazem 180 mg and heart rate, BP, and EF allow.  As above, ideally would wean off of hydralazine given its tendency for reflex tachycardia.  HLD --Recheck lipids, LFTs.  Continue current statin and Zetia for now.  LDL goal of below 70.  Moderate MR --Updating echo as above. HR/BP control.   DM2 --Recently discontinued Metformin due to diarrhea.  Previous hemoglobin A1c 8.5.  SSI.  Monitor.  Continue glycemic control per IM.  History of tobacco use --Ongoing cessation advised.  Anxiety --Consider as contributing to above.      TIMI Risk Score for Unstable Angina or Non-ST Elevation MI:   The patient's TIMI risk score is  4, which indicates a 20% risk of all cause mortality, new or recurrent myocardial infarction or need for urgent revascularization in the next 14 days.      CHA2DS2-VASc Score = 5  This indicates a 7.2% annual risk of stroke. The patient's score is based upon: CHF History: Yes HTN History: Yes Diabetes History: Yes Stroke History: No Vascular Disease History: Yes Age Score: 1 Gender Score: 0         For questions or updates, please contact [facility] Please consult [email or web address] for contact info under    Signed, , PA-C  09/07/2020 11:55 AM

## 2020-09-07 NOTE — H&P (Signed)
History and Physical    Clinton Dorsey OYD:[id number] DOB: 1951-01-16 DOA: 09/07/2020  PCP: [name], MD   Patient coming from: Home  I have personally briefly reviewed patient's old medical records in [facility]  Chief Complaint: Palpitations  HPI: Clinton Dorsey is a 69 y.o. male with medical history significant for paroxysmal atrial fibrillation, diabetes mellitus, coronary artery disease, hypertension who presents to the ER for evaluation of palpitations that he noticed on the morning of his admission.  He was also diaphoretic and had some chest discomfort which was midsternal that he described as indigestion. He and his wife had gone for a wine tasting event the evening before his admission and he had about 2 glasses of wine. He denies having any nausea, no vomiting, no shortness of breath, no dizziness, no lightheadedness, no abdominal pain, no constipation, no diarrhea, no urinary symptoms, no headache, no cough, no fever, no chills or any mental status changes. Per patient he put his watch on and noted that he was in atrial fibrillation and his heart rate was in the 120s so he came to the ER for evaluation. Labs show sodium 134, potassium 3.7, chloride 97, bicarb 25, glucose 128, BUN 23, creatinine 1.12, calcium 9.1, troponin XVIII, white count 7.4, hemoglobin 12.5, hematocrit 37.1, MCV 92.8, RDW 11.9, platelet count 261 Respiratory viral panel is negative Twelve-lead EKG reviewed by me shows atrial fibrillation with a rapid ventricular rate and an incomplete right bundle branch block.    ED Course: Patient is a 69 year old Caucasian male who presents to the ER for evaluation of palpitations and found to be in rapid atrial fibrillation.  He also complained of indigestion and has a history of coronary artery disease.  Patient will be admitted to the hospital for rate control and to rule out an acute coronary syndrome.  Review of Systems: As per HPI otherwise all  systems and negative.    Past Medical History:  Diagnosis Date  . Arrhythmia   . Coronary artery disease    a. NSTEMI; b. 12/23/13 cath 11/2013: D1 80%, LCx 90% failed PCI, dRCA 99%, PL branch 99% s/p PCI/DES,  EF 55%; c. staged cardiac cath 12/2013: s/p PCI/DES to both D1 and mLCx, resdial LM 40%, ostial LCx 50%  . Depression   . Depression   . Diabetes mellitus (HCC)    a. poorly controlled  . Hyperlipidemia   . Hypertension   . Hypokalemia    a. recurrent  . Kidney stones   . MI (myocardial infarction) (HCC) 11/2013  . Mitral regurgitation    a. echo 11/2013: EF 55-60%, mild LVH, mildly dilated LA, mild TR, mild to mod MR, mildly elevated RVSP    . Obesity   . Tobacco abuse     Past Surgical History:  Procedure Laterality Date  . CARDIAC CATHETERIZATION  12/08/2013  . CORONARY ANGIOPLASTY  12/08/2013   s/p stent placement  . HERNIA REPAIR    . LAMINECTOMY    . PERCUTANEOUS CORONARY STENT INTERVENTION (PCI-S) N/A 12/23/2013   Procedure: PERCUTANEOUS CORONARY STENT INTERVENTION (PCI-S);  Surgeon: [name], MD;  Location: [facility] CATH LAB;  Service: Cardiovascular;  Laterality: N/A;  . SPINE SURGERY  2014  . TONSILLECTOMY       reports that he quit smoking about 6 years ago. His smoking use included cigars. He has never used smokeless tobacco. He reports that he does not drink alcohol and does not use drugs.  Allergies  Allergen Reactions  .  Spironolactone Other (See Comments)    Gynomastia  . Penicillins Hives and Rash       . Atorvastatin Other (See Comments)    Myalgias   . Crestor [Rosuvastatin Calcium] Other (See Comments)    Myalgias   . Simvastatin Other (See Comments)    Myalgias    Family History  Problem Relation Age of Onset  . Heart attack Father 69       MI  . Heart disease Father        CABG  . Hypertension Mother   . Hypertension Sister      Prior to Admission medications   Medication Sig Start Date End Date Taking? Authorizing Provider   buPROPion (WELLBUTRIN XL) 150 MG 24 hr tablet Take 1 tablet (150 mg total) by mouth daily. 12/14/19  Yes Particia Nearing, PA-C  carvedilol (COREG) 25 MG tablet Take 25 mg by mouth 2 (two) times daily. 05/26/20  Yes [provider]  cholecalciferol (VITAMIN D3) 25 MCG (1000 UNIT) tablet Take 1,000 Units by mouth daily.   Yes [provider]  Coenzyme Q10 10 MG capsule Take 10 mg by mouth daily.    Yes [provider]  diltiazem (CARDIZEM CD) 120 MG 24 hr capsule Take 1 capsule (120 mg total) by mouth in the morning and at bedtime. Patient taking differently: Take 120 mg by mouth daily. 01/13/20 01/12/21 Yes Gollan, Tollie Pizza, MD  ELIQUIS 5 MG TABS tablet TAKE 1 TABLET(5 MG) BY MOUTH TWICE DAILY Patient taking differently: Take 5 mg by mouth 2 (two) times daily. 06/03/20  Yes Gollan, Tollie Pizza, MD  ezetimibe (ZETIA) 10 MG tablet TAKE 1 TABLET(10 MG) BY MOUTH DAILY Patient taking differently: Take 10 mg by mouth daily. 07/04/20  Yes Gollan, Tollie Pizza, MD  Glucose Blood (BLOOD GLUCOSE TEST STRIPS) STRP 1 strip by In Vitro route daily. 11/24/18  Yes Crissman, Redge Gainer, MD  hydrALAZINE (APRESOLINE) 50 MG tablet Take 50 mg by mouth 3 (three) times daily.  08/05/20 08/05/21 Yes [provider]  losartan (COZAAR) 100 MG tablet TAKE 1 TABLET(100 MG) BY MOUTH DAILY Patient taking differently: Take 100 mg by mouth daily. 07/04/20  Yes Gollan, Tollie Pizza, MD  magnesium oxide (MAG-OX) 400 MG tablet Take 400 mg by mouth daily.   Yes [provider]  Multiple Vitamin (MULTIVITAMIN) capsule Take 1 capsule by mouth daily.   Yes [provider]  rosuvastatin (CRESTOR) 20 MG tablet Take 20 mg by mouth every evening.   Yes [provider]  sildenafil (REVATIO) 20 MG tablet TAKE 1-5 TABLETS BY MOUTH DAILY AS NEEDED Patient taking differently: Take 20-100 mg by mouth daily as needed (erectile dysfunction). 12/15/19  Yes Antonieta Iba, MD    Physical  Exam: Vitals:   09/07/20 1000 09/07/20 1015 09/07/20 1021 09/07/20 1234  BP: 120/74   (!) 139/95  Pulse: (!) 104 96 90   Resp: (!) 21 (!) 23 (!) 21   Temp:      TempSrc:      SpO2: 95% 93% 92%   Weight:      Height:         Vitals:   09/07/20 1000 09/07/20 1015 09/07/20 1021 09/07/20 1234  BP: 120/74   (!) 139/95  Pulse: (!) 104 96 90   Resp: (!) 21 (!) 23 (!) 21   Temp:      TempSrc:      SpO2: 95% 93% 92%   Weight:  Height:        Constitutional: NAD, alert and oriented x 3 Eyes: PERRL, lids and conjunctivae normal ENMT: Mucous membranes are moist.  Neck: normal, supple, no masses, no thyromegaly Respiratory: clear to auscultation bilaterally, no wheezing, no crackles. Normal respiratory effort. No accessory muscle use.  Cardiovascular: Irregularly irregular, tachycardic, no murmurs / rubs / gallops. No extremity edema. 2+ pedal pulses. No carotid bruits.  Abdomen: no tenderness, no masses palpated. No hepatosplenomegaly. Bowel sounds positive.  Musculoskeletal: no clubbing / cyanosis. No joint deformity upper and lower extremities.  Skin: no rashes, lesions, ulcers.  Neurologic: No gross focal neurologic deficit. Psychiatric: Normal mood and affect.   Labs on Admission: I have personally reviewed following labs and imaging studies  CBC: Recent Labs  Lab 09/07/20 0907  WBC 7.4  HGB 12.5*  HCT 37.1*  MCV 92.8  PLT 261   Basic Metabolic Panel: Recent Labs  Lab 09/07/20 0907  NA 134*  K 3.7  CL 97*  CO2 25  GLUCOSE 128*  BUN 23  CREATININE 1.12  CALCIUM 9.1   GFR: Estimated Creatinine Clearance: 61.4 mL/min (by C-G formula based on SCr of 1.12 mg/dL). Liver Function Tests: No results for input(s): AST, ALT, ALKPHOS, BILITOT, PROT, ALBUMIN in the last 168 hours. No results for input(s): LIPASE, AMYLASE in the last 168 hours. No results for input(s): AMMONIA in the last 168 hours. Coagulation Profile: No results for input(s): INR, PROTIME in the  last 168 hours. Cardiac Enzymes: No results for input(s): CKTOTAL, CKMB, CKMBINDEX, TROPONINI in the last 168 hours. BNP (last 3 results) No results for input(s): PROBNP in the last 8760 hours. HbA1C: No results for input(s): HGBA1C in the last 72 hours. CBG: No results for input(s): GLUCAP in the last 168 hours. Lipid Profile: No results for input(s): CHOL, HDL, LDLCALC, TRIG, CHOLHDL, LDLDIRECT in the last 72 hours. Thyroid Function Tests: No results for input(s): TSH, T4TOTAL, FREET4, T3FREE, THYROIDAB in the last 72 hours. Anemia Panel: No results for input(s): VITAMINB12, FOLATE, FERRITIN, TIBC, IRON, RETICCTPCT in the last 72 hours. Urine analysis:    Component Value Date/Time   APPEARANCEUR Clear 11/24/2018 1414   GLUCOSEU Negative 11/24/2018 1414   BILIRUBINUR Negative 11/24/2018 1414   PROTEINUR 1+ (A) 11/24/2018 1414   NITRITE Negative 11/24/2018 1414   LEUKOCYTESUR Negative 11/24/2018 1414    Radiological Exams on Admission: No results found.  EKG: Independently reviewed.  Atrial fibrillation with a rapid ventricular rate  Assessment/Plan Principal Problem:   Atrial fibrillation with rapid ventricular response (HCC) Active Problems:   Essential hypertension   CAD (coronary artery disease)   Diabetes mellitus without complication (HCC)     Atrial fibrillation with rapid ventricular rate Patient has a known history of atrial fibrillation presents for evaluation of palpitations He is noted to be in a rapid ventricular rate Continue Cardizem drip Resume oral Cardizem and carvedilol to optimize rate control Continue Eliquis as primary prophylaxis for an acute stroke Obtain 2D echocardiogram to assess LVEF Consult cardiology    Hypertension  continue Cozaar, carvedilol and diltiazem    Coronary artery disease Continue carvedilol and statins Cycle cardiac enzymes to rule out acute coronary syndrome   Depression Continue Wellbutrin    Diabetes  mellitus Diet controlled Maintain consistent carbohydrate diet Blood sugar checks with meals     DVT prophylaxis: Apixaban Code Status: Full code Family Communication: Greater than 50% of time was spent discussing patient's condition and plan of care with him and his  wife at the bedside.  All questions and concerns have been addressed.  They verbalized understanding and agree with the plan Disposition Plan: Back to previous home environment Consults called: Cardiology    Eliseo Withers MD [facility]     09/07/2020, 12:43 PM

## 2020-09-08 ENCOUNTER — Telehealth: Payer: Self-pay | Admitting: *Deleted

## 2020-09-08 DIAGNOSIS — I4891 Unspecified atrial fibrillation: Secondary | ICD-10-CM | POA: Diagnosis not present

## 2020-09-08 LAB — CBG MONITORING, ED
Glucose-Capillary: 119 mg/dL — ABNORMAL HIGH (ref 70–99)
Glucose-Capillary: 117 mg/dL — ABNORMAL HIGH (ref 70–99)

## 2020-09-08 MED ORDER — DILTIAZEM HCL ER COATED BEADS 240 MG PO CP24: 240.0000 mg | ORAL_CAPSULE | Freq: Every day | ORAL | 1 refills | Status: DC

## 2020-09-08 NOTE — Progress Notes (Signed)
Progress Note  Patient Name: Clinton Dorsey Date of Encounter: 09/08/2020  [facility] Cardiologist: [name], MD   Subjective   No Chest pain, shortness of breath or palpitations.  He continues to be in atrial fibrillation but ventricular rate is more controlled   Inpatient Medications    Scheduled Meds: . apixaban  5 mg Oral BID  . buPROPion  150 mg Oral Daily  . carvedilol  25 mg Oral BID  . cholecalciferol  1,000 Units Oral Daily  . diltiazem  60 mg Oral TID  . ezetimibe  10 mg Oral Daily  . insulin aspart  0-15 Units Subcutaneous TID WC  . losartan  50 mg Oral Daily  . magnesium oxide  400 mg Oral Daily  . multivitamin with minerals  1 tablet Oral Daily  . rosuvastatin  20 mg Oral QPM  . sodium chloride flush  3 mL Intravenous Q12H   Continuous Infusions: . sodium chloride     PRN Meds: sodium chloride, acetaminophen, ondansetron (ZOFRAN) IV, sodium chloride flush   Vital Signs    Vitals:   09/08/20 1200 09/08/20 1300 09/08/20 1319 09/08/20 1400  BP: (!) 145/98 (!) 148/105  (!) 144/94  Pulse: 73 60 74 (!) 59  Resp: (!) 25 (!) 23 20 10   Temp:   97.6 F (36.4 C)   TempSrc:   Oral   SpO2: 93% 95% 96% 94%  Weight:      Height:       No intake or output data in the 24 hours ending 09/08/20 1437 Last 3 Weights 09/07/2020 08/17/2020 07/29/2020  Weight (lbs) 173 lb 180 lb 175 lb  Weight (kg) 78.472 kg 81.647 kg 79.379 kg      Telemetry    Atrial fibrillation with ventricular rate between 80 to 100 bpm- Personally Reviewed  ECG    Not done today.- Personally Reviewed  Physical Exam   GEN: No acute distress.   Neck: No JVD Cardiac:  Irregularly irregular, no murmurs, rubs, or gallops.  Respiratory: Clear to auscultation bilaterally. GI: Soft, nontender, non-distended  MS: No edema; No deformity. Neuro:  Nonfocal  Psych: Normal affect   Labs    High Sensitivity Troponin:   Recent Labs  Lab 09/07/20 0907 09/07/20 1201 09/07/20 1533   TROPONINIHS 18* 16 17      Chemistry Recent Labs  Lab 09/07/20 0907  NA 134*  K 3.7  CL 97*  CO2 25  GLUCOSE 128*  BUN 23  CREATININE 1.12  CALCIUM 9.1  GFRNONAA >60  ANIONGAP 12     Hematology Recent Labs  Lab 09/07/20 0907  WBC 7.4  RBC 4.00*  HGB 12.5*  HCT 37.1*  MCV 92.8  MCH 31.3  MCHC 33.7  RDW 11.9  PLT 261    BNPNo results for input(s): BNP, PROBNP in the last 168 hours.   DDimer No results for input(s): DDIMER in the last 168 hours.   Radiology    ECHOCARDIOGRAM COMPLETE  Result Date: 09/07/2020    ECHOCARDIOGRAM REPORT   Patient Name:   Clinton Dorsey Date of Exam: 09/07/2020 Medical Rec #:  09/09/2020       Height:       66.0 in Accession #:    [id number]      Weight:       173.0 lb Date of Birth:  1951-01-09       BSA:          1.880 m Patient Age:  69 years        BP:           139/95 mmHg Patient Gender: M               HR:           90 bpm. Exam Location:  [facility] Procedure: 2D Echo, Color Doppler and Cardiac Doppler Indications:     Atrial Fibrillation 148.91  History:         Patient has prior history of Echocardiogram examinations, most                  recent 11/02/2015. Previous Myocardial Infarction; Risk                  Factors:Hypertension and Diabetes. Mitral regurgitation.  Sonographer:     Cristela Blue RDCS (AE) Referring Phys:  [id number] Debbe Odea Diagnosing Phys: Debbe Odea MD IMPRESSIONS  1. Left ventricular ejection fraction, by estimation, is 55 to 60%. The left ventricle has normal function. The left ventricle has no regional wall motion abnormalities. There is mild left ventricular hypertrophy. Left ventricular diastolic parameters are indeterminate.  2. Right ventricular systolic function is low normal. The right ventricular size is mildly enlarged.  3. Left atrial size was severely dilated.  4. Right atrial size was moderately dilated.  5. The mitral valve is normal in structure. Mild mitral valve regurgitation.  6. The aortic  valve is tricuspid. Aortic valve regurgitation is not visualized. Mild aortic valve sclerosis is present, with no evidence of aortic valve stenosis.  7. The inferior vena cava is normal in size with greater than 50% respiratory variability, suggesting right atrial pressure of 3 mmHg. FINDINGS  Left Ventricle: Left ventricular ejection fraction, by estimation, is 55 to 60%. The left ventricle has normal function. The left ventricle has no regional wall motion abnormalities. The left ventricular internal cavity size was normal in size. There is  mild left ventricular hypertrophy. Left ventricular diastolic parameters are indeterminate. Right Ventricle: The right ventricular size is mildly enlarged. No increase in right ventricular wall thickness. Right ventricular systolic function is low normal. Left Atrium: Left atrial size was severely dilated. Right Atrium: Right atrial size was moderately dilated. Pericardium: There is no evidence of pericardial effusion. Mitral Valve: The mitral valve is normal in structure. Mild mitral valve regurgitation. Tricuspid Valve: The tricuspid valve is normal in structure. Tricuspid valve regurgitation is mild. Aortic Valve: The aortic valve is tricuspid. Aortic valve regurgitation is not visualized. Mild aortic valve sclerosis is present, with no evidence of aortic valve stenosis. Aortic valve mean gradient measures 5.3 mmHg. Aortic valve peak gradient measures 9.3 mmHg. Aortic valve area, by VTI measures 1.51 cm. Pulmonic Valve: The pulmonic valve was not well visualized. Pulmonic valve regurgitation is not visualized. Aorta: The aortic root is normal in size and structure. Venous: The inferior vena cava is normal in size with greater than 50% respiratory variability, suggesting right atrial pressure of 3 mmHg. IAS/Shunts: No atrial level shunt detected by color flow Doppler.  LEFT VENTRICLE PLAX 2D LVIDd:         4.69 cm LVIDs:         3.08 cm LV PW:         1.60 cm LV IVS:         1.56 cm LVOT diam:     2.00 cm LV SV:         38 LV SV Index:   20 LVOT Area:  3.14 cm  RIGHT VENTRICLE RV Basal diam:  4.74 cm RV S prime:     16.80 cm/s TAPSE (M-mode): 3.2 cm LEFT ATRIUM              Index       RIGHT ATRIUM           Index LA diam:        5.30 cm  2.82 cm/m  RA Area:     29.30 cm LA Vol (A2C):   126.0 ml 67.01 ml/m RA Volume:   98.30 ml  52.28 ml/m LA Vol (A4C):   82.9 ml  44.09 ml/m LA Biplane Vol: 103.0 ml 54.78 ml/m  AORTIC VALVE                    PULMONIC VALVE AV Area (Vmax):    1.48 cm     PV Vmax:        0.77 m/s AV Area (Vmean):   1.38 cm     PV Peak grad:   2.4 mmHg AV Area (VTI):     1.51 cm     RVOT Peak grad: 3 mmHg AV Vmax:           152.33 cm/s AV Vmean:          109.000 cm/s AV VTI:            0.250 m AV Peak Grad:      9.3 mmHg AV Mean Grad:      5.3 mmHg LVOT Vmax:         71.70 cm/s LVOT Vmean:        47.800 cm/s LVOT VTI:          0.120 m LVOT/AV VTI ratio: 0.48  AORTA Ao Root diam: 3.10 cm MITRAL VALVE                TRICUSPID VALVE MV Area (PHT): 3.31 cm     TR Peak grad:   37.7 mmHg MV Decel Time: 229 msec     TR Vmax:        307.00 cm/s MV E velocity: 109.00 cm/s                             SHUNTS                             Systemic VTI:  0.12 m                             Systemic Diam: 2.00 cm Debbe Odea MD Electronically signed by Debbe Odea MD Signature Date/Time: 09/07/2020/2:47:08 PM    Final     Cardiac Studies   Echocardiogram done yesterday showed normal LV systolic function and no significant valvular abnormalities.  Patient Profile     69 y.o. male with history of coronary artery disease status post drug-eluting stent placement to the left circumflex, diagonal and distal RCA in 2015, paroxysmal atrial fibrillation, essential hypertension and type 2 diabetes who presented with A. fib with RVR.  Assessment & Plan    1.  Paroxysmal atrial fibrillation with rapid ventricular response: He continues to be in atrial  fibrillation but ventricular rate is more controlled after decreasing the dose of oral diltiazem.  His symptoms have improved.  He is on long-term anticoagulation with Eliquis which should  be continued. The patient can be discharged home on carvedilol 25 mg twice daily and diltiazem extended release 240 mg once daily.  He was on 120 mg at home.  Continue other antihypertensive medications. We will arrange for follow-up in our office with Dr. Mariah Milling within 1 week.  If he remains in atrial fibrillation, cardioversion can be considered.  Keep him off hydralazine. If the patient starts having more frequent episodes of atrial fibrillation, an antiarrhythmic medication might be needed.  2.  Chest pain: In only the setting of tachycardia and has resolved since then.  Troponin was normal.  Echo showed normal LV systolic function and wall motion.  No need for further ischemic cardiac evaluation.   For questions or updates, please contact [facility] Please consult [email or web address] for contact info under        Signed, Lorine Bears, MD  09/08/2020, 2:37 PM

## 2020-09-08 NOTE — ED Notes (Signed)
MD at bedside. 

## 2020-09-08 NOTE — Telephone Encounter (Addendum)
Currently admitted per epic will need fu next week with call

## 2020-09-08 NOTE — ED Notes (Signed)
Sent a message to floor nurses to see if they are ready for the pt. Pt resting comfortably and in NAD at this time.  VSS.

## 2020-09-08 NOTE — Telephone Encounter (Signed)
-----   Message from [name], MD sent at 09/08/2020  2:42 PM EST ----- The patient is being discharged from [facility] for atrial fibrillation.  TCM follow-up needed in 1 week with Dr. Mariah Milling or APP.

## 2020-09-08 NOTE — Discharge Instructions (Signed)
Advised to follow-up with primary care physician in 1 week.   Advised to take Cardizem CD 240 mg daily for heart rate control. Advised to follow-up with cardiology in 1 week, appointment has been made.

## 2020-09-08 NOTE — Telephone Encounter (Signed)
TCM....  Patient is being discharged   They saw Kirke Corin  They are scheduled to see Alycia Rossetti 01-04 at 25  They were seen for Afib  They need to be seen within 1 week   Added to wait list   Please call

## 2020-09-08 NOTE — ED Notes (Signed)
Took over care of pt. Pt resting comfortably and in NAD at this time. VSS. Awaiting further orders. Will continue to monitor.  

## 2020-09-08 NOTE — Telephone Encounter (Signed)
Patient discharged today.  Need to call on Monday.

## 2020-09-08 NOTE — Discharge Summary (Signed)
Physician Discharge Summary  Eoin Andrada KGS:[id number] DOB: [birth year] DOA: 09/07/2020  PCP: [name], MD  Admit date: 09/07/2020   Discharge date: 09/08/2020  Admitted From: Home Disposition: Home  Recommendations for Outpatient Follow-up:  1. Follow up with PCP in 1-2 weeks 2. Please obtain BMP/CBC in one week 3. Advised to take Cardizem CD 240 mg daily for heart rate control. 4. Advised to follow-up with cardiology Dr. Mariah Milling in 1 week,  5. Appointment has been made.  Home Health: None Equipment/Devices: None  Discharge Condition: Stable CODE STATUS:Full code Diet recommendation: Heart Healthy   Brief [facility] course: This 69 years old male with PMH significant for paroxysmal A. Fib on eliquis, diabetes, CAD, hypertension who presents in the ED for the evaluation of palpitations.  Patient reports he and his wife went for wine tasting after he came back,  he noticed some chest discomfort associated with indigestion and diaphoresis.  Patient reports he has Apple Watch and he noticed,  he was in atrial fibrillation and his heart rate was in 130s so he came to the ED.  EKG shows atrial fibrillation with RVR and incomplete right bundle branch block.  Patient was admitted for A. fib with RVR,  started on Cardizem drip,  Patient was on Eliquis prior to hospitalization.  Cardiology consulted.  Heart rate improved, Cardizem changed to Cardizem 60 mg every 8 hours.  Echocardiogram shows LVEF 55 to 60%,  no regional wall motion abnormalities.  Patient is cleared from cardiology to be discharged.  Patient is being discharged on Cardizem CD 240 daily.  Patient will follow up with Dr. Mariah Milling in 1 week.  Advised to continue Eliquis.  If patient continues to remain in A. fib on next appointment,  he might be considered for cardioversion.  He was managed for below problems.  Discharge Diagnoses:  Principal Problem:   Atrial fibrillation with rapid ventricular response  (HCC) Active Problems:   Essential hypertension   CAD (coronary artery disease)   Diabetes mellitus without complication (HCC)   Atrial fibrillation with rapid ventricular rate Patient has a known history of atrial fibrillation presents for evaluation of palpitations He is noted to be in a rapid ventricular rate. Started on Cardizem drip.  Heart rate is improving Resume oral Cardizem and carvedilol to optimize rate control Continue Eliquis as primary prophylaxis for an acute stroke 2D echocardiogram shows LVEF 55 to 60%.  No regional wall motion abnormality. Cardiology consulted.  Heart rate improved. Patient went into NSR. Patient is being discharged on Cardizem CD 240s daily.   Hypertension continue Cozaar, carvedilol and diltiazem  Coronary artery disease Continue carvedilol and statins Cycle cardiac enzymes to rule out acute coronary syndrome   Depression Continue Wellbutrin  Diabetes mellitus Diet controlled Maintain consistent carbohydrate diet Blood sugar checks with meals.  Discharge Instructions  Discharge Instructions    Amb referral to AFIB Clinic   Complete by: As directed    Call MD for:  difficulty breathing, headache or visual disturbances   Complete by: As directed    Call MD for:  persistant dizziness or light-headedness   Complete by: As directed    Call MD for:  persistant nausea and vomiting   Complete by: As directed    Diet - low sodium heart healthy   Complete by: As directed    Diet Carb Modified   Complete by: As directed    Discharge instructions   Complete by: As directed    Advised to follow-up with  primary care physician in 1 week.   Advised to take Cardizem CD 240 mg daily for heart rate control. Advised to follow-up with cardiology in 1 week, appointment has been made.   Increase activity slowly   Complete by: As directed      Allergies as of 09/08/2020      Reactions   Spironolactone Other (See Comments)   Gynomastia    Penicillins Hives, Rash      Atorvastatin Other (See Comments)   Myalgias   Crestor [rosuvastatin Calcium] Other (See Comments)   Myalgias    Simvastatin Other (See Comments)   Myalgias      Medication List    STOP taking these medications   hydrALAZINE 50 MG tablet Commonly known as: APRESOLINE     TAKE these medications   BLOOD GLUCOSE TEST STRIPS Strp 1 strip by In Vitro route daily.   buPROPion 150 MG 24 hr tablet Commonly known as: WELLBUTRIN XL Take 1 tablet (150 mg total) by mouth daily.   carvedilol 25 MG tablet Commonly known as: COREG Take 25 mg by mouth 2 (two) times daily.   cholecalciferol 25 MCG (1000 UNIT) tablet Commonly known as: VITAMIN D3 Take 1,000 Units by mouth daily.   Coenzyme Q10 10 MG capsule Take 10 mg by mouth daily.   diltiazem 240 MG 24 hr capsule Commonly known as: Cartia XT Take 1 capsule (240 mg total) by mouth daily. What changed:   medication strength  how much to take  when to take this   Eliquis 5 MG Tabs tablet Generic drug: apixaban TAKE 1 TABLET(5 MG) BY MOUTH TWICE DAILY What changed: See the new instructions.   ezetimibe 10 MG tablet Commonly known as: ZETIA TAKE 1 TABLET(10 MG) BY MOUTH DAILY What changed: See the new instructions.   losartan 100 MG tablet Commonly known as: COZAAR TAKE 1 TABLET(100 MG) BY MOUTH DAILY What changed: See the new instructions.   magnesium oxide 400 MG tablet Commonly known as: MAG-OX Take 400 mg by mouth daily.   multivitamin capsule Take 1 capsule by mouth daily.   rosuvastatin 20 MG tablet Commonly known as: CRESTOR Take 20 mg by mouth every evening.   sildenafil 20 MG tablet Commonly known as: REVATIO TAKE 1-5 TABLETS BY MOUTH DAILY AS NEEDED What changed: See the new instructions.       Follow-up Information    [name], MD Follow up in 1 week(s).   Specialty: Internal Medicine Contact information: [address] [facility] [place] [address] [phone]        Antonieta Iba, MD .   Specialty: Cardiology Contact information: [address] [place] [address] [phone]              Allergies  Allergen Reactions  . Spironolactone Other (See Comments)    Gynomastia  . Penicillins Hives and Rash       . Atorvastatin Other (See Comments)    Myalgias   . Crestor [Rosuvastatin Calcium] Other (See Comments)    Myalgias   . Simvastatin Other (See Comments)    Myalgias    Consultations:  Cardiology   Procedures/Studies: ECHOCARDIOGRAM COMPLETE  Result Date: 09/07/2020    ECHOCARDIOGRAM REPORT   Patient Name:   DEFORREST BOGLE Date of Exam: 09/07/2020 Medical Rec #:  [id number]       Height:       66.0 in Accession #:    [id number]  Weight:       173.0 lb Date of Birth:  Feb 16, 1951       BSA:          1.880 m Patient Age:    69 years        BP:           139/95 mmHg Patient Gender: M               HR:           90 bpm. Exam Location:  [facility] Procedure: 2D Echo, Color Doppler and Cardiac Doppler Indications:     Atrial Fibrillation 148.91  History:         Patient has prior history of Echocardiogram examinations, most                  recent 11/02/2015. Previous Myocardial Infarction; Risk                  Factors:Hypertension and Diabetes. Mitral regurgitation.  Sonographer:     Cristela Blue RDCS (AE) Referring Phys:  [id number] Debbe Odea Diagnosing Phys: Debbe Odea MD IMPRESSIONS  1. Left ventricular ejection fraction, by estimation, is 55 to 60%. The left ventricle has normal function. The left ventricle has no regional wall motion abnormalities. There is mild left ventricular hypertrophy. Left ventricular diastolic parameters are indeterminate.  2. Right ventricular systolic function is low normal. The right ventricular size is mildly enlarged.  3. Left atrial size was severely dilated.  4. Right atrial size was moderately dilated.  5. The mitral valve is normal  in structure. Mild mitral valve regurgitation.  6. The aortic valve is tricuspid. Aortic valve regurgitation is not visualized. Mild aortic valve sclerosis is present, with no evidence of aortic valve stenosis.  7. The inferior vena cava is normal in size with greater than 50% respiratory variability, suggesting right atrial pressure of 3 mmHg. FINDINGS  Left Ventricle: Left ventricular ejection fraction, by estimation, is 55 to 60%. The left ventricle has normal function. The left ventricle has no regional wall motion abnormalities. The left ventricular internal cavity size was normal in size. There is  mild left ventricular hypertrophy. Left ventricular diastolic parameters are indeterminate. Right Ventricle: The right ventricular size is mildly enlarged. No increase in right ventricular wall thickness. Right ventricular systolic function is low normal. Left Atrium: Left atrial size was severely dilated. Right Atrium: Right atrial size was moderately dilated. Pericardium: There is no evidence of pericardial effusion. Mitral Valve: The mitral valve is normal in structure. Mild mitral valve regurgitation. Tricuspid Valve: The tricuspid valve is normal in structure. Tricuspid valve regurgitation is mild. Aortic Valve: The aortic valve is tricuspid. Aortic valve regurgitation is not visualized. Mild aortic valve sclerosis is present, with no evidence of aortic valve stenosis. Aortic valve mean gradient measures 5.3 mmHg. Aortic valve peak gradient measures 9.3 mmHg. Aortic valve area, by VTI measures 1.51 cm. Pulmonic Valve: The pulmonic valve was not well visualized. Pulmonic valve regurgitation is not visualized. Aorta: The aortic root is normal in size and structure. Venous: The inferior vena cava is normal in size with greater than 50% respiratory variability, suggesting right atrial pressure of 3 mmHg. IAS/Shunts: No atrial level shunt detected by color flow Doppler.  LEFT VENTRICLE PLAX 2D LVIDd:         4.69  cm LVIDs:         3.08 cm LV PW:         1.60 cm LV  IVS:        1.56 cm LVOT diam:     2.00 cm LV SV:         38 LV SV Index:   20 LVOT Area:     3.14 cm  RIGHT VENTRICLE RV Basal diam:  4.74 cm RV S prime:     16.80 cm/s TAPSE (M-mode): 3.2 cm LEFT ATRIUM              Index       RIGHT ATRIUM           Index LA diam:        5.30 cm  2.82 cm/m  RA Area:     29.30 cm LA Vol (A2C):   126.0 ml 67.01 ml/m RA Volume:   98.30 ml  52.28 ml/m LA Vol (A4C):   82.9 ml  44.09 ml/m LA Biplane Vol: 103.0 ml 54.78 ml/m  AORTIC VALVE                    PULMONIC VALVE AV Area (Vmax):    1.48 cm     PV Vmax:        0.77 m/s AV Area (Vmean):   1.38 cm     PV Peak grad:   2.4 mmHg AV Area (VTI):     1.51 cm     RVOT Peak grad: 3 mmHg AV Vmax:           152.33 cm/s AV Vmean:          109.000 cm/s AV VTI:            0.250 m AV Peak Grad:      9.3 mmHg AV Mean Grad:      5.3 mmHg LVOT Vmax:         71.70 cm/s LVOT Vmean:        47.800 cm/s LVOT VTI:          0.120 m LVOT/AV VTI ratio: 0.48  AORTA Ao Root diam: 3.10 cm MITRAL VALVE                TRICUSPID VALVE MV Area (PHT): 3.31 cm     TR Peak grad:   37.7 mmHg MV Decel Time: 229 msec     TR Vmax:        307.00 cm/s MV E velocity: 109.00 cm/s                             SHUNTS                             Systemic VTI:  0.12 m                             Systemic Diam: 2.00 cm Debbe Odea MD Electronically signed by Debbe Odea MD Signature Date/Time: 09/07/2020/2:47:08 PM    Final     Echocardiogram   Subjective: Patient was seen and examined at bedside.  Overnight events noted.  Patient's heart rate is improved.  He denies any chest pain,  shortness of breath and palpitations.  He wants to be discharged home.  Discharge Exam: Vitals:   09/08/20 1400 09/08/20 1500  BP: (!) 144/94 (!) 146/94  Pulse: (!) 59 88  Resp: 10 19  Temp:    SpO2: 94% 93%  Vitals:   09/08/20 1300 09/08/20 1319 09/08/20 1400 09/08/20 1500  BP: (!) 148/105  (!) 144/94 (!)  146/94  Pulse: 60 74 (!) 59 88  Resp: (!) 23 20 10 19   Temp:  97.6 F (36.4 C)    TempSrc:  Oral    SpO2: 95% 96% 94% 93%  Weight:      Height:        General: Pt is alert, awake, not in acute distress Cardiovascular: RRR, S1/S2 +, no rubs, no gallops Respiratory: CTA bilaterally, no wheezing, no rhonchi Abdominal: Soft, NT, ND, bowel sounds + Extremities: no edema, no cyanosis    The results of significant diagnostics from this hospitalization (including imaging, microbiology, ancillary and laboratory) are listed below for reference.     Microbiology: Recent Results (from the past 240 hour(s))  Resp Panel by RT-PCR (Flu A&B, Covid) Nasopharyngeal Swab     Status: None   Collection Time: 09/07/20 10:24 AM   Specimen: Nasopharyngeal Swab; Nasopharyngeal(NP) swabs in vial transport medium  Result Value Ref Range Status   SARS Coronavirus 2 by RT PCR NEGATIVE NEGATIVE Final    Comment: (NOTE) SARS-CoV-2 target nucleic acids are NOT DETECTED.  The SARS-CoV-2 RNA is generally detectable in upper respiratory specimens during the acute phase of infection. The lowest concentration of SARS-CoV-2 viral copies this assay can detect is 138 copies/mL. A negative result does not preclude SARS-Cov-2 infection and should not be used as the sole basis for treatment or other patient management decisions. A negative result may occur with  improper specimen collection/handling, submission of specimen other than nasopharyngeal swab, presence of viral mutation(s) within the areas targeted by this assay, and inadequate number of viral copies(<138 copies/mL). A negative result must be combined with clinical observations, patient history, and epidemiological information. The expected result is Negative.  Fact Sheet for Patients:  BloggerCourse.com  Fact Sheet for Healthcare Providers:  SeriousBroker.it  This test is no t yet approved or  cleared by the [place] FDA and  has been authorized for detection and/or diagnosis of SARS-CoV-2 by FDA under an Emergency Use Authorization (EUA). This EUA will remain  in effect (meaning this test can be used) for the duration of the COVID-19 declaration under Section 564(b)(1) of the Act, 21 U.S.C.section 360bbb-3(b)(1), unless the authorization is terminated  or revoked sooner.       Influenza A by PCR NEGATIVE NEGATIVE Final   Influenza B by PCR NEGATIVE NEGATIVE Final    Comment: (NOTE) The Xpert Xpress SARS-CoV-2/FLU/RSV plus assay is intended as an aid in the diagnosis of influenza from Nasopharyngeal swab specimens and should not be used as a sole basis for treatment. Nasal washings and aspirates are unacceptable for Xpert Xpress SARS-CoV-2/FLU/RSV testing.  Fact Sheet for Patients: BloggerCourse.com  Fact Sheet for Healthcare Providers: SeriousBroker.it  This test is not yet approved or cleared by the [place] FDA and has been authorized for detection and/or diagnosis of SARS-CoV-2 by FDA under an Emergency Use Authorization (EUA). This EUA will remain in effect (meaning this test can be used) for the duration of the COVID-19 declaration under Section 564(b)(1) of the Act, 21 U.S.C. section 360bbb-3(b)(1), unless the authorization is terminated or revoked.  Performed at [facility], [address] [place], [place] [address]      Labs: BNP (last 3 results) No results for input(s): BNP in the last 8760 hours. Basic Metabolic Panel: Recent Labs  Lab 09/07/20 0907  NA 134*  K 3.7  CL 97*  CO2 25  GLUCOSE 128*  BUN 23  CREATININE 1.12  CALCIUM 9.1   Liver Function Tests: No results for input(s): AST, ALT, ALKPHOS, BILITOT, PROT, ALBUMIN in the last 168 hours. No results for input(s): LIPASE, AMYLASE in the last 168 hours. No results for input(s): AMMONIA in the last 168  hours. CBC: Recent Labs  Lab 09/07/20 0907  WBC 7.4  HGB 12.5*  HCT 37.1*  MCV 92.8  PLT 261   Cardiac Enzymes: No results for input(s): CKTOTAL, CKMB, CKMBINDEX, TROPONINI in the last 168 hours. BNP: Invalid input(s): POCBNP CBG: Recent Labs  Lab 09/07/20 1935 09/08/20 0824 09/08/20 1302  GLUCAP 221* 119* 117*   D-Dimer No results for input(s): DDIMER in the last 72 hours. Hgb A1c Recent Labs    09/07/20 0907  HGBA1C 6.4*   Lipid Profile No results for input(s): CHOL, HDL, LDLCALC, TRIG, CHOLHDL, LDLDIRECT in the last 72 hours. Thyroid function studies Recent Labs    09/07/20 1201  TSH 1.782   Anemia work up No results for input(s): VITAMINB12, FOLATE, FERRITIN, TIBC, IRON, RETICCTPCT in the last 72 hours. Urinalysis    Component Value Date/Time   APPEARANCEUR Clear 11/24/2018 1414   GLUCOSEU Negative 11/24/2018 1414   BILIRUBINUR Negative 11/24/2018 1414   PROTEINUR 1+ (A) 11/24/2018 1414   NITRITE Negative 11/24/2018 1414   LEUKOCYTESUR Negative 11/24/2018 1414   Sepsis Labs Invalid input(s): PROCALCITONIN,  WBC,  LACTICIDVEN Microbiology Recent Results (from the past 240 hour(s))  Resp Panel by RT-PCR (Flu A&B, Covid) Nasopharyngeal Swab     Status: None   Collection Time: 09/07/20 10:24 AM   Specimen: Nasopharyngeal Swab; Nasopharyngeal(NP) swabs in vial transport medium  Result Value Ref Range Status   SARS Coronavirus 2 by RT PCR NEGATIVE NEGATIVE Final    Comment: (NOTE) SARS-CoV-2 target nucleic acids are NOT DETECTED.  The SARS-CoV-2 RNA is generally detectable in upper respiratory specimens during the acute phase of infection. The lowest concentration of SARS-CoV-2 viral copies this assay can detect is 138 copies/mL. A negative result does not preclude SARS-Cov-2 infection and should not be used as the sole basis for treatment or other patient management decisions. A negative result may occur with  improper specimen collection/handling,  submission of specimen other than nasopharyngeal swab, presence of viral mutation(s) within the areas targeted by this assay, and inadequate number of viral copies(<138 copies/mL). A negative result must be combined with clinical observations, patient history, and epidemiological information. The expected result is Negative.  Fact Sheet for Patients:  BloggerCourse.com  Fact Sheet for Healthcare Providers:  SeriousBroker.it  This test is no t yet approved or cleared by the [place] FDA and  has been authorized for detection and/or diagnosis of SARS-CoV-2 by FDA under an Emergency Use Authorization (EUA). This EUA will remain  in effect (meaning this test can be used) for the duration of the COVID-19 declaration under Section 564(b)(1) of the Act, 21 U.S.C.section 360bbb-3(b)(1), unless the authorization is terminated  or revoked sooner.       Influenza A by PCR NEGATIVE NEGATIVE Final   Influenza B by PCR NEGATIVE NEGATIVE Final    Comment: (NOTE) The Xpert Xpress SARS-CoV-2/FLU/RSV plus assay is intended as an aid in the diagnosis of influenza from Nasopharyngeal swab specimens and should not be used as a sole basis for treatment. Nasal washings and aspirates are unacceptable for Xpert Xpress SARS-CoV-2/FLU/RSV testing.  Fact Sheet for Patients: BloggerCourse.com  Fact Sheet for Healthcare Providers: SeriousBroker.it  This test is not yet approved or cleared by the [place] and has been authorized for detection and/or diagnosis of SARS-CoV-2 by FDA under an Emergency Use Authorization (EUA). This EUA will remain in effect (meaning this test can be used) for the duration of the COVID-19 declaration under Section 564(b)(1) of the Act, 21 U.S.C. section 360bbb-3(b)(1), unless the authorization is terminated or revoked.  Performed at [facility], [address], [place], [place] [address]      Time coordinating discharge: Over 30 minutes  SIGNED:   Cipriano Bunker, MD  [facility] 09/08/2020, 3:40 PM Pager   If 7PM-7AM, please contact night-coverage [email or web address]

## 2020-09-12 ENCOUNTER — Telehealth: Payer: Self-pay

## 2020-09-12 DIAGNOSIS — N62 Hypertrophy of breast: Secondary | ICD-10-CM | POA: Diagnosis not present

## 2020-09-12 NOTE — Telephone Encounter (Signed)
Patient contacted regarding discharge from [facility] on 09/08/20.  Patient understands to follow up with provider Eula Listen, PA on 09/20/20 at 11 am at [facility]. Patient understands discharge instructions? yes Patient understands medications and regiment? yes Patient understands to bring all medications to this visit? yes  Pt state he can tell he's still in Afib but feels ok. Denies SOB or any other symptoms. He report HR is averaging between 60-90. Pt state he is suppose to go to [facility] this weekend for his anniversary and wanted to check with Dr. Mariah Milling before doing so.   Will forward message for approval.

## 2020-09-12 NOTE — Telephone Encounter (Signed)
Return to work note uploaded to [employer] as requested. Dr. Mariah Milling verbalized okay for pt to return after ER visit for Afib. Has f/u with Eula Listen, PA-C on 09/20/2020.

## 2020-09-12 NOTE — Telephone Encounter (Signed)
Patient calling.  Patient states he was in the hospital for Afib.  He states work is requesting a note before he can come back to work.  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  He said if the note could be put through mychart that would be perfect that he could print it off .

## 2020-09-12 NOTE — Telephone Encounter (Signed)
Dr. Mariah Milling said that is ok to write a work note if he was in the hospital for a-fib. I just wanted to clarify that he is only asking for a note for while he was in the hospital? If so, then yes that is fine.

## 2020-09-17 DIAGNOSIS — E119 Type 2 diabetes mellitus without complications: Secondary | ICD-10-CM | POA: Diagnosis not present

## 2020-09-18 NOTE — H&P (View-Only) (Signed)
Cardiology Office Note    Date:  09/20/2020   ID:  Clinton Dorsey, DOB [birth year], MRN [id number]  PCP:  [name], MD  Cardiologist:  [name], MD  Electrophysiologist:  None   Chief Complaint: Hospital follow up  History of Present Illness:   Clinton Dorsey is a 70 y.o. male with history of CAD with NSTEMI in 11/2013 s/p PCI/DES to RPL2 on 12/08/2013 and staged PCI to mid LCx and D1 on 12/23/2013, DM2, persistent Afib on Eliquis, HTN, HLD, strong family history of CAD, tobacco use, and obesity who presents for hospital follow up after recent admission to [facility] for Afib with RVR as below.   He was admitted to [facility] 11/2013 for NSTEMI. Echo 12/08/2013 showed EF of 55-60%, mild LVH, mildly dilated LA, mild TR, mild to moderate MR, RVSP mildly elevated. He underwemt cardiac cath that showed D1 80%, LCx 90% with failed attempt to place stent, distal RCA/ostial PL branch 99% s/p PCI/DES. EF 55%. He underwent staged PCI given the above failed stent to LCx and radiation load in 12/2013 at [facility] with successful PCI/DES to both midLCx and D1. There was residual 40% left main stenosis and 50% ostial LCx stenosis. He was seen in 10/2015 with palpitations. Echo at that time showed an EF of 55-60%, mild concentric LVH, no RWMA, Gr2DD, moderate mitral regurgitation, moderately dilated left atrium, and a PASP of 40 mmHg. 48-hour Holter showed sinus rhythm, frequent PACs and PVCs, rare short runs of narrow complex tachycardia and NSVT. Repeat 48-hour Holter in 10/2015 showed sinus rhythm, PACs/PVCs with runs of bigeminy, short episode of idioventricular rhythm, and several episodes of SVT lasting up to 6 beats. More recently, he was seen in the ED in 07/2019 for palpitations and told he was in Afib. At that time he was placed on Eliquis and converted to sinus rhythm with diltiazem.  He was most recently seen in the office on 08/17/2020 with continued, though improved elevated BP readings. No changes were made  at that time. EKG showed sinus bradycardia, 59 bpm. It was noted underlying sinus bradycardia has previously precluded escalation of Cardizem.  He was admitted from 12/22-12/23/2021 with palpitations and chest pressure with an irregular heart beat noted by his watch. He was noted to be in Afib with RVR with ventricular rates in the 120s bpm. Initial HS-Tn 18 with a delta troponin of 16. Echo during the admission showed an EF of 55-60%, no RWMA, mild LVH, indeterminate LV diastolic function parameters, low normal RVSF with a mildly enlarged RV cavity size, severely dilated left atrium measuring 53 mm, moderately dilated right atrium, mild mitral regurgitation, mild aortic valve sclerosis without evidence of stenosis, and an estimated right atrial pressure of 3 mmHg. He remained in Afib during the admission with controlled ventricular response following titration of Cardizem.    He comes in doing well from a cardiac perspective.  Occasionally he does note some mild tachypalpitations when laying down in the evening hours though these are improved compared to what he was experiencing leading up to his hospital admission.  He has tolerated the dose adjustment of Cardizem CD without issues.  He remains on Eliquis 5 mg twice daily and has not missed any doses within the past 3 to 4 weeks.  He is tolerating anticoagulation without issues.  He does note his BP is typically elevated at home and states it is always been like this.  He prefers no medication changes at this time.   Labs  independently reviewed: 08/2020 - TSH normal, A1c 6.4, HGB 12.5, PLT 261, potassium 3.7, BUN 23, SCr 1.12 05/2020 - albumin 4.4, AST/ALT normal, TC 143, TG 119, HDL 42, LDL 77  Past Medical History:  Diagnosis Date  . Arrhythmia   . Coronary artery disease    a. NSTEMI; b. 12/23/13 cath 11/2013: D1 80%, LCx 90% failed PCI, dRCA 99%, PL branch 99% s/p PCI/DES,  EF 55%; c. staged cardiac cath 12/2013: s/p PCI/DES to both D1 and mLCx,  resdial LM 40%, ostial LCx 50%  . Depression   . Depression   . Diabetes mellitus (HCC)    a. poorly controlled  . Hyperlipidemia   . Hypertension   . Hypokalemia    a. recurrent  . Kidney stones   . MI (myocardial infarction) (HCC) 11/2013  . Mitral regurgitation    a. echo 11/2013: EF 55-60%, mild LVH, mildly dilated LA, mild TR, mild to mod MR, mildly elevated RVSP    . Obesity   . Tobacco abuse     Past Surgical History:  Procedure Laterality Date  . CARDIAC CATHETERIZATION  12/08/2013  . CORONARY ANGIOPLASTY  12/08/2013   s/p stent placement  . HERNIA REPAIR    . LAMINECTOMY    . PERCUTANEOUS CORONARY STENT INTERVENTION (PCI-S) N/A 12/23/2013   Procedure: PERCUTANEOUS CORONARY STENT INTERVENTION (PCI-S);  Surgeon: Clinton A Arida, MD;  Location: [facility] CATH LAB;  Service: Cardiovascular;  Laterality: N/A;  . SPINE SURGERY  2014  . TONSILLECTOMY      Current Medications: Current Meds  Medication Sig  . buPROPion (WELLBUTRIN XL) 150 MG 24 hr tablet Take 1 tablet (150 mg total) by mouth daily.  . carvedilol (COREG) 25 MG tablet Take 25 mg by mouth 2 (two) times daily.  . cholecalciferol (VITAMIN D3) 25 MCG (1000 UNIT) tablet Take 1,000 Units by mouth daily.  . Coenzyme Q10 10 MG capsule Take 10 mg by mouth daily.   . diltiazem (CARDIZEM) 30 MG tablet Take 1 tablet (30 mg) by mouth three times a day as needed for heart rates > 130 bpm  . diltiazem (CARTIA XT) 240 MG 24 hr capsule Take 1 capsule (240 mg total) by mouth daily.  . ELIQUIS 5 MG TABS tablet TAKE 1 TABLET(5 MG) BY MOUTH TWICE DAILY  . ezetimibe (ZETIA) 10 MG tablet TAKE 1 TABLET(10 MG) BY MOUTH DAILY  . Glucose Blood (BLOOD GLUCOSE TEST STRIPS) STRP 1 strip by In Vitro route daily.  . losartan (COZAAR) 100 MG tablet TAKE 1 TABLET(100 MG) BY MOUTH DAILY  . magnesium oxide (MAG-OX) 400 MG tablet Take 400 mg by mouth daily.  . metFORMIN (GLUCOPHAGE) 500 MG tablet Take 500 mg by mouth daily with supper.  . Multiple  Vitamin (MULTIVITAMIN) capsule Take 1 capsule by mouth daily.  . rosuvastatin (CRESTOR) 20 MG tablet Take 20 mg by mouth every evening.  . sildenafil (REVATIO) 20 MG tablet TAKE 1-5 TABLETS BY MOUTH DAILY AS NEEDED    Allergies:   Spironolactone, Penicillins, Atorvastatin, Crestor [rosuvastatin calcium], and Simvastatin   Social History   Socioeconomic History  . Marital status: Married    Spouse name: Not on file  . Number of children: Not on file  . Years of education: Not on file  . Highest education level: Not on file  Occupational History  . Not on file  Tobacco Use  . Smoking status: Former Smoker    Types: Cigars    Quit date: 12/08/2013    Years   since quitting: 6.7  . Smokeless tobacco: Never Used  Vaping Use  . Vaping Use: Not on file  Substance and Sexual Activity  . Alcohol use: No    Alcohol/week: 1.0 standard drink    Types: 1 Glasses of wine per week    Comment: occasional  . Drug use: No  . Sexual activity: Not on file  Other Topics Concern  . Not on file  Social History Narrative  . Not on file   Social Determinants of Health   Financial Resource Strain: Not on file  Food Insecurity: Not on file  Transportation Needs: Not on file  Physical Activity: Not on file  Stress: Not on file  Social Connections: Not on file     Family History:  The patient's family history includes Heart attack (age of onset: 110) in his father; Heart disease in his father; Hypertension in his mother and sister.  ROS:   Review of Systems  Constitutional: Negative for chills, diaphoresis, fever, malaise/fatigue and weight loss.  HENT: Negative for congestion.   Eyes: Negative for discharge and redness.  Respiratory: Negative for cough, sputum production, shortness of breath and wheezing.   Cardiovascular: Negative for chest pain, palpitations, orthopnea, claudication, leg swelling and PND.  Gastrointestinal: Negative for abdominal pain, blood in stool, heartburn, melena,  nausea and vomiting.  Musculoskeletal: Negative for falls and myalgias.  Skin: Negative for rash.  Neurological: Negative for dizziness, tingling, tremors, sensory change, speech change, focal weakness, loss of consciousness and weakness.  Endo/Heme/Allergies: Does not bruise/bleed easily.  Psychiatric/Behavioral: Negative for substance abuse. The patient is not nervous/anxious.   All other systems reviewed and are negative.    EKGs/Labs/Other Studies Reviewed:    Studies reviewed were summarized above. The additional studies were reviewed today:  2D echo 12/22/201: 1. Left ventricular ejection fraction, by estimation, is 55 to 60%. The  left ventricle has normal function. The left ventricle has no regional  wall motion abnormalities. There is mild left ventricular hypertrophy.  Left ventricular diastolic parameters  are indeterminate.  2. Right ventricular systolic function is low normal. The right  ventricular size is mildly enlarged.  3. Left atrial size was severely dilated.  4. Right atrial size was moderately dilated.  5. The mitral valve is normal in structure. Mild mitral valve  regurgitation.  6. The aortic valve is tricuspid. Aortic valve regurgitation is not  visualized. Mild aortic valve sclerosis is present, with no evidence of  aortic valve stenosis.  7. The inferior vena cava is normal in size with greater than 50%  respiratory variability, suggesting right atrial pressure of 3 mmHg. __________  48-hour Holter 10/2015: Average heart rate 65 bpm, maximum heart rate 121 bpm Normal sinus rhythm with APCs and PVCs, including couplets, runs of bigeminy Short episodes of Idioventricular escape rhythm noted rate of 53 bpm Several episodes of episodes of supraventricular tachycardia, 6 beats __________  48-hour Holter 10/2015: Normal sinus rhythm, Frequent APCs and PVCs, Rare short runs of narrow complex tachycardia, Rare short runs of NSVT __________  2D  echo 10/2015: - Left ventricle: The cavity size was mildly dilated. There was  mild concentric hypertrophy. Systolic function was normal. The  estimated ejection fraction was in the range of 55% to 60%. Wall  motion was normal; there were no regional wall motion  abnormalities. Features are consistent with a pseudonormal left  ventricular filling pattern, with concomitant abnormal relaxation  and increased filling pressure (grade 2 diastolic dysfunction).  - Mitral valve: There  was moderate regurgitation.  - Left atrium: The atrium was moderately dilated.  - Pulmonary arteries: Systolic pressure was mildly increased. PA  peak pressure: 40 mm Hg (S). __________  [facility] 12/2013: PCI to mid LCx and D1 __________  [facility] 11/2013: D1 80%, mid LCx 90%, RPL2 99% s/p PCI/DES   EKG:  EKG is ordered today.  The EKG ordered today demonstrates A. fib, 74 bpm, cannot exclude prior septal infarct, no acute ST-T changes  Recent Labs: 09/07/2020: BUN 23; Creatinine, Ser 1.12; Hemoglobin 12.5; Platelets 261; Potassium 3.7; Sodium 134; TSH 1.782  Recent Lipid Panel    Component Value Date/Time   CHOL 121 11/24/2018 1453   CHOL 164 07/10/2016 1525   CHOL 182 12/07/2013 0956   TRIG 133 11/24/2018 1453   TRIG 108 07/10/2016 1525   TRIG 224 (H) 12/07/2013 0956   HDL 39 (L) 11/24/2018 1453   HDL 33 (L) 12/07/2013 0956   CHOLHDL 3.1 11/24/2018 1453   VLDL 22 07/10/2016 1525   VLDL 45 (H) 12/07/2013 0956   LDLCALC 55 11/24/2018 1453   LDLCALC 104 (H) 12/07/2013 0956    PHYSICAL EXAM:    VS:  BP (!) 162/102 (BP Location: Left Arm, Patient Position: Sitting, Cuff Size: Normal)   Pulse 74   Ht 5\' 5"  (1.651 m)   Wt 172 lb (78 kg)   SpO2 98%   BMI 28.62 kg/m   BMI: Body mass index is 28.62 kg/m.  Physical Exam Vitals reviewed.  Constitutional:      Appearance: He is well-developed and well-nourished.  HENT:     Head: Normocephalic and atraumatic.  Eyes:     General:        Right  eye: No discharge.        Left eye: No discharge.  Neck:     Vascular: No JVD.  Cardiovascular:     Rate and Rhythm: Normal rate. Rhythm irregularly irregular.     Pulses: No midsystolic click and no opening snap.          Posterior tibial pulses are 2+ on the right side and 2+ on the left side.     Heart sounds: Normal heart sounds, S1 normal and S2 normal. Heart sounds not distant. No murmur heard. No friction rub.  Pulmonary:     Effort: Pulmonary effort is normal. No respiratory distress.     Breath sounds: Normal breath sounds. No decreased breath sounds, wheezing or rales.  Chest:     Chest wall: No tenderness.  Abdominal:     General: There is no distension.     Palpations: Abdomen is soft.     Tenderness: There is no abdominal tenderness.  Musculoskeletal:        General: No edema.     Cervical back: Normal range of motion.  Skin:    General: Skin is warm and dry.     Nails: There is no clubbing or cyanosis.  Neurological:     Mental Status: He is alert and oriented to person, place, and time.  Psychiatric:        Mood and Affect: Mood and affect normal.        Speech: Speech normal.        Behavior: Behavior normal.        Thought Content: Thought content normal.        Judgment: Judgment normal.     Wt Readings from Last 3 Encounters:  09/20/20 172 lb (78 kg)  09/07/20 173 lb (  78.5 kg)  08/17/20 180 lb (81.6 kg)     ASSESSMENT & PLAN:   1. Persistent Afib: He remains in A. fib with controlled ventricular response.  We had a discussion today regarding rate versus rhythm control strategies.  Given this is only his second documented episode of A. fib we will pursue DCCV later this week with his primary cardiologist.  It was discussed with him that given his left atrial dimension this may not be successful or hold long-term and he may require antiarrhythmic therapy.  Alternatively, if cardioversion is unsuccessful, if his ventricular rates remain well controlled, and  if he remains asymptomatic rate control strategy could be pursued down the road.  When he is in sinus rhythm he does have a mildly bradycardic heart rate typically in the upper 50s bpm.  Given this, he may need dose adjustment of his Cardizem following DCCV.  He was provided a prescription for short acting diltiazem 30 mg to be taken 3 times daily as needed for sustained tachypalpitations.  He will remain on Eliquis indefinitely given a CHA2DS2-VASc of 4 (HTN, age x 1, DM, vascular disease).  He denies missing any doses of anticoagulation.  He will remain out of work until after his cardioversion with a return to work date of 09/26/2020.  2. CAD involving the native coronary arteries without angina: He is doing well without any symptoms concerning for angina.  He remains on Eliquis in place of aspirin given underlying A. fib and in an effort to reduce bleeding risk.  He will otherwise continue secondary prevention with carvedilol, losartan, Crestor, and Zetia.  No indication for ischemic testing at this time.  3. HTN: Blood pressure is suboptimally controlled at triage at 162/102.  He prefers no medication changes at this time.  He remains on carvedilol, Cardizem CD, and losartan.  If needed, could consider addition of chlorthalidone with KCl supplementation.  4. HLD: LDL 77 from 05/2020 with normal LFT at that time.  Now back on Crestor 20 mg daily and remains on Zetia.  Goal LDL less than 70.  Consider rechecking fasting lipid and liver function at next visit.  This was not discussed in detail at today's visit.  Disposition: F/u with Dr. Mariah Milling or an APP in 3 weeks following cardioversion.   Medication Adjustments/Labs and Tests Ordered: Current medicines are reviewed at length with the patient today.  Concerns regarding medicines are outlined above. Medication changes, Labs and Tests ordered today are summarized above and listed in the Patient Instructions accessible in Encounters.   Signed, Eula Listen, PA-C 09/20/2020 11:57 AM     [facility] [place] [address] [place], [place] [address] [phone]

## 2020-09-18 NOTE — Progress Notes (Signed)
Cardiology Office Note    Date:  09/20/2020   ID:  Clinton Dorsey, DOB [birth year], MRN [id number]  PCP:  [name], MD  Cardiologist:  [name], MD  Electrophysiologist:  None   Chief Complaint: Hospital follow up  History of Present Illness:   Clinton Dorsey is a 70 y.o. male with history of CAD with NSTEMI in 11/2013 s/p PCI/DES to RPL2 on 12/08/2013 and staged PCI to mid LCx and D1 on 12/23/2013, DM2, persistent Afib on Eliquis, HTN, HLD, strong family history of CAD, tobacco use, and obesity who presents for hospital follow up after recent admission to [facility] for Afib with RVR as below.   He was admitted to [facility] 11/2013 for NSTEMI. Echo 12/08/2013 showed EF of 55-60%, mild LVH, mildly dilated LA, mild TR, mild to moderate MR, RVSP mildly elevated. He underwemt cardiac cath that showed D1 80%, LCx 90% with failed attempt to place stent, distal RCA/ostial PL branch 99% s/p PCI/DES. EF 55%. He underwent staged PCI given the above failed stent to LCx and radiation load in 12/2013 at [facility] with successful PCI/DES to both midLCx and D1. There was residual 40% left main stenosis and 50% ostial LCx stenosis. He was seen in 10/2015 with palpitations. Echo at that time showed an EF of 55-60%, mild concentric LVH, no RWMA, Gr2DD, moderate mitral regurgitation, moderately dilated left atrium, and a PASP of 40 mmHg. 48-hour Holter showed sinus rhythm, frequent PACs and PVCs, rare short runs of narrow complex tachycardia and NSVT. Repeat 48-hour Holter in 10/2015 showed sinus rhythm, PACs/PVCs with runs of bigeminy, short episode of idioventricular rhythm, and several episodes of SVT lasting up to 6 beats. More recently, he was seen in the ED in 07/2019 for palpitations and told he was in Afib. At that time he was placed on Eliquis and converted to sinus rhythm with diltiazem.  He was most recently seen in the office on 08/17/2020 with continued, though improved elevated BP readings. No changes were made  at that time. EKG showed sinus bradycardia, 59 bpm. It was noted underlying sinus bradycardia has previously precluded escalation of Cardizem.  He was admitted from 12/22-12/23/2021 with palpitations and chest pressure with an irregular heart beat noted by his watch. He was noted to be in Afib with RVR with ventricular rates in the 120s bpm. Initial HS-Tn 18 with a delta troponin of 16. Echo during the admission showed an EF of 55-60%, no RWMA, mild LVH, indeterminate LV diastolic function parameters, low normal RVSF with a mildly enlarged RV cavity size, severely dilated left atrium measuring 53 mm, moderately dilated right atrium, mild mitral regurgitation, mild aortic valve sclerosis without evidence of stenosis, and an estimated right atrial pressure of 3 mmHg. He remained in Afib during the admission with controlled ventricular response following titration of Cardizem.    He comes in doing well from a cardiac perspective.  Occasionally he does note some mild tachypalpitations when laying down in the evening hours though these are improved compared to what he was experiencing leading up to his hospital admission.  He has tolerated the dose adjustment of Cardizem CD without issues.  He remains on Eliquis 5 mg twice daily and has not missed any doses within the past 3 to 4 weeks.  He is tolerating anticoagulation without issues.  He does note his BP is typically elevated at home and states it is always been like this.  He prefers no medication changes at this time.   Labs  independently reviewed: 08/2020 - TSH normal, A1c 6.4, HGB 12.5, PLT 261, potassium 3.7, BUN 23, SCr 1.12 05/2020 - albumin 4.4, AST/ALT normal, TC 143, TG 119, HDL 42, LDL 77  Past Medical History:  Diagnosis Date  . Arrhythmia   . Coronary artery disease    a. NSTEMI; b. 12/23/13 cath 11/2013: D1 80%, LCx 90% failed PCI, dRCA 99%, PL branch 99% s/p PCI/DES,  EF 55%; c. staged cardiac cath 12/2013: s/p PCI/DES to both D1 and mLCx,  resdial LM 40%, ostial LCx 50%  . Depression   . Depression   . Diabetes mellitus (HCC)    a. poorly controlled  . Hyperlipidemia   . Hypertension   . Hypokalemia    a. recurrent  . Kidney stones   . MI (myocardial infarction) (HCC) 11/2013  . Mitral regurgitation    a. echo 11/2013: EF 55-60%, mild LVH, mildly dilated LA, mild TR, mild to mod MR, mildly elevated RVSP    . Obesity   . Tobacco abuse     Past Surgical History:  Procedure Laterality Date  . CARDIAC CATHETERIZATION  12/08/2013  . CORONARY ANGIOPLASTY  12/08/2013   s/p stent placement  . HERNIA REPAIR    . LAMINECTOMY    . PERCUTANEOUS CORONARY STENT INTERVENTION (PCI-S) N/A 12/23/2013   Procedure: PERCUTANEOUS CORONARY STENT INTERVENTION (PCI-S);  Surgeon: [name], MD;  Location: [facility] CATH LAB;  Service: Cardiovascular;  Laterality: N/A;  . SPINE SURGERY  2014  . TONSILLECTOMY      Current Medications: Current Meds  Medication Sig  . buPROPion (WELLBUTRIN XL) 150 MG 24 hr tablet Take 1 tablet (150 mg total) by mouth daily.  . carvedilol (COREG) 25 MG tablet Take 25 mg by mouth 2 (two) times daily.  . cholecalciferol (VITAMIN D3) 25 MCG (1000 UNIT) tablet Take 1,000 Units by mouth daily.  . Coenzyme Q10 10 MG capsule Take 10 mg by mouth daily.   Marland Kitchen diltiazem (CARDIZEM) 30 MG tablet Take 1 tablet (30 mg) by mouth three times a day as needed for heart rates > 130 bpm  . diltiazem (CARTIA XT) 240 MG 24 hr capsule Take 1 capsule (240 mg total) by mouth daily.  Marland Kitchen ELIQUIS 5 MG TABS tablet TAKE 1 TABLET(5 MG) BY MOUTH TWICE DAILY  . ezetimibe (ZETIA) 10 MG tablet TAKE 1 TABLET(10 MG) BY MOUTH DAILY  . Glucose Blood (BLOOD GLUCOSE TEST STRIPS) STRP 1 strip by In Vitro route daily.  Marland Kitchen losartan (COZAAR) 100 MG tablet TAKE 1 TABLET(100 MG) BY MOUTH DAILY  . magnesium oxide (MAG-OX) 400 MG tablet Take 400 mg by mouth daily.  . metFORMIN (GLUCOPHAGE) 500 MG tablet Take 500 mg by mouth daily with supper.  . Multiple  Vitamin (MULTIVITAMIN) capsule Take 1 capsule by mouth daily.  . rosuvastatin (CRESTOR) 20 MG tablet Take 20 mg by mouth every evening.  . sildenafil (REVATIO) 20 MG tablet TAKE 1-5 TABLETS BY MOUTH DAILY AS NEEDED    Allergies:   Spironolactone, Penicillins, Atorvastatin, Crestor [rosuvastatin calcium], and Simvastatin   Social History   Socioeconomic History  . Marital status: Married    Spouse name: Not on file  . Number of children: Not on file  . Years of education: Not on file  . Highest education level: Not on file  Occupational History  . Not on file  Tobacco Use  . Smoking status: Former Smoker    Types: Cigars    Quit date: 12/08/2013    Years  since quitting: 6.7  . Smokeless tobacco: Never Used  Vaping Use  . Vaping Use: Not on file  Substance and Sexual Activity  . Alcohol use: No    Alcohol/week: 1.0 standard drink    Types: 1 Glasses of wine per week    Comment: occasional  . Drug use: No  . Sexual activity: Not on file  Other Topics Concern  . Not on file  Social History Narrative  . Not on file   Social Determinants of Health   Financial Resource Strain: Not on file  Food Insecurity: Not on file  Transportation Needs: Not on file  Physical Activity: Not on file  Stress: Not on file  Social Connections: Not on file     Family History:  The patient's family history includes Heart attack (age of onset: 110) in his father; Heart disease in his father; Hypertension in his mother and sister.  ROS:   Review of Systems  Constitutional: Negative for chills, diaphoresis, fever, malaise/fatigue and weight loss.  HENT: Negative for congestion.   Eyes: Negative for discharge and redness.  Respiratory: Negative for cough, sputum production, shortness of breath and wheezing.   Cardiovascular: Negative for chest pain, palpitations, orthopnea, claudication, leg swelling and PND.  Gastrointestinal: Negative for abdominal pain, blood in stool, heartburn, melena,  nausea and vomiting.  Musculoskeletal: Negative for falls and myalgias.  Skin: Negative for rash.  Neurological: Negative for dizziness, tingling, tremors, sensory change, speech change, focal weakness, loss of consciousness and weakness.  Endo/Heme/Allergies: Does not bruise/bleed easily.  Psychiatric/Behavioral: Negative for substance abuse. The patient is not nervous/anxious.   All other systems reviewed and are negative.    EKGs/Labs/Other Studies Reviewed:    Studies reviewed were summarized above. The additional studies were reviewed today:  2D echo 12/22/201: 1. Left ventricular ejection fraction, by estimation, is 55 to 60%. The  left ventricle has normal function. The left ventricle has no regional  wall motion abnormalities. There is mild left ventricular hypertrophy.  Left ventricular diastolic parameters  are indeterminate.  2. Right ventricular systolic function is low normal. The right  ventricular size is mildly enlarged.  3. Left atrial size was severely dilated.  4. Right atrial size was moderately dilated.  5. The mitral valve is normal in structure. Mild mitral valve  regurgitation.  6. The aortic valve is tricuspid. Aortic valve regurgitation is not  visualized. Mild aortic valve sclerosis is present, with no evidence of  aortic valve stenosis.  7. The inferior vena cava is normal in size with greater than 50%  respiratory variability, suggesting right atrial pressure of 3 mmHg. __________  48-hour Holter 10/2015: Average heart rate 65 bpm, maximum heart rate 121 bpm Normal sinus rhythm with APCs and PVCs, including couplets, runs of bigeminy Short episodes of Idioventricular escape rhythm noted rate of 53 bpm Several episodes of episodes of supraventricular tachycardia, 6 beats __________  48-hour Holter 10/2015: Normal sinus rhythm, Frequent APCs and PVCs, Rare short runs of narrow complex tachycardia, Rare short runs of NSVT __________  2D  echo 10/2015: - Left ventricle: The cavity size was mildly dilated. There was  mild concentric hypertrophy. Systolic function was normal. The  estimated ejection fraction was in the range of 55% to 60%. Wall  motion was normal; there were no regional wall motion  abnormalities. Features are consistent with a pseudonormal left  ventricular filling pattern, with concomitant abnormal relaxation  and increased filling pressure (grade 2 diastolic dysfunction).  - Mitral valve: There  was moderate regurgitation.  - Left atrium: The atrium was moderately dilated.  - Pulmonary arteries: Systolic pressure was mildly increased. PA  peak pressure: 40 mm Hg (S). __________  [facility] 12/2013: PCI to mid LCx and D1 __________  [facility] 11/2013: D1 80%, mid LCx 90%, RPL2 99% s/p PCI/DES   EKG:  EKG is ordered today.  The EKG ordered today demonstrates A. fib, 74 bpm, cannot exclude prior septal infarct, no acute ST-T changes  Recent Labs: 09/07/2020: BUN 23; Creatinine, Ser 1.12; Hemoglobin 12.5; Platelets 261; Potassium 3.7; Sodium 134; TSH 1.782  Recent Lipid Panel    Component Value Date/Time   CHOL 121 11/24/2018 1453   CHOL 164 07/10/2016 1525   CHOL 182 12/07/2013 0956   TRIG 133 11/24/2018 1453   TRIG 108 07/10/2016 1525   TRIG 224 (H) 12/07/2013 0956   HDL 39 (L) 11/24/2018 1453   HDL 33 (L) 12/07/2013 0956   CHOLHDL 3.1 11/24/2018 1453   VLDL 22 07/10/2016 1525   VLDL 45 (H) 12/07/2013 0956   LDLCALC 55 11/24/2018 1453   LDLCALC 104 (H) 12/07/2013 0956    PHYSICAL EXAM:    VS:  BP (!) 162/102 (BP Location: Left Arm, Patient Position: Sitting, Cuff Size: Normal)   Pulse 74   Ht 5\' 5"  (1.651 m)   Wt 172 lb (78 kg)   SpO2 98%   BMI 28.62 kg/m   BMI: Body mass index is 28.62 kg/m.  Physical Exam Vitals reviewed.  Constitutional:      Appearance: He is well-developed and well-nourished.  HENT:     Head: Normocephalic and atraumatic.  Eyes:     General:        Right  eye: No discharge.        Left eye: No discharge.  Neck:     Vascular: No JVD.  Cardiovascular:     Rate and Rhythm: Normal rate. Rhythm irregularly irregular.     Pulses: No midsystolic click and no opening snap.          Posterior tibial pulses are 2+ on the right side and 2+ on the left side.     Heart sounds: Normal heart sounds, S1 normal and S2 normal. Heart sounds not distant. No murmur heard. No friction rub.  Pulmonary:     Effort: Pulmonary effort is normal. No respiratory distress.     Breath sounds: Normal breath sounds. No decreased breath sounds, wheezing or rales.  Chest:     Chest wall: No tenderness.  Abdominal:     General: There is no distension.     Palpations: Abdomen is soft.     Tenderness: There is no abdominal tenderness.  Musculoskeletal:        General: No edema.     Cervical back: Normal range of motion.  Skin:    General: Skin is warm and dry.     Nails: There is no clubbing or cyanosis.  Neurological:     Mental Status: He is alert and oriented to person, place, and time.  Psychiatric:        Mood and Affect: Mood and affect normal.        Speech: Speech normal.        Behavior: Behavior normal.        Thought Content: Thought content normal.        Judgment: Judgment normal.     Wt Readings from Last 3 Encounters:  09/20/20 172 lb (78 kg)  09/07/20 173 lb (  78.5 kg)  08/17/20 180 lb (81.6 kg)     ASSESSMENT & PLAN:   1. Persistent Afib: He remains in A. fib with controlled ventricular response.  We had a discussion today regarding rate versus rhythm control strategies.  Given this is only his second documented episode of A. fib we will pursue DCCV later this week with his primary cardiologist.  It was discussed with him that given his left atrial dimension this may not be successful or hold long-term and he may require antiarrhythmic therapy.  Alternatively, if cardioversion is unsuccessful, if his ventricular rates remain well controlled, and  if he remains asymptomatic rate control strategy could be pursued down the road.  When he is in sinus rhythm he does have a mildly bradycardic heart rate typically in the upper 50s bpm.  Given this, he may need dose adjustment of his Cardizem following DCCV.  He was provided a prescription for short acting diltiazem 30 mg to be taken 3 times daily as needed for sustained tachypalpitations.  He will remain on Eliquis indefinitely given a CHA2DS2-VASc of 4 (HTN, age x 1, DM, vascular disease).  He denies missing any doses of anticoagulation.  He will remain out of work until after his cardioversion with a return to work date of 09/26/2020.  2. CAD involving the native coronary arteries without angina: He is doing well without any symptoms concerning for angina.  He remains on Eliquis in place of aspirin given underlying A. fib and in an effort to reduce bleeding risk.  He will otherwise continue secondary prevention with carvedilol, losartan, Crestor, and Zetia.  No indication for ischemic testing at this time.  3. HTN: Blood pressure is suboptimally controlled at triage at 162/102.  He prefers no medication changes at this time.  He remains on carvedilol, Cardizem CD, and losartan.  If needed, could consider addition of chlorthalidone with KCl supplementation.  4. HLD: LDL 77 from 05/2020 with normal LFT at that time.  Now back on Crestor 20 mg daily and remains on Zetia.  Goal LDL less than 70.  Consider rechecking fasting lipid and liver function at next visit.  This was not discussed in detail at today's visit.  Disposition: F/u with Dr. Mariah Milling or an APP in 3 weeks following cardioversion.   Medication Adjustments/Labs and Tests Ordered: Current medicines are reviewed at length with the patient today.  Concerns regarding medicines are outlined above. Medication changes, Labs and Tests ordered today are summarized above and listed in the Patient Instructions accessible in Encounters.   Signed, Eula Listen, PA-C 09/20/2020 11:57 AM     [facility] [place] [address] [place], [place] [address] [phone]

## 2020-09-20 ENCOUNTER — Other Ambulatory Visit: Payer: Self-pay

## 2020-09-20 ENCOUNTER — Ambulatory Visit (INDEPENDENT_AMBULATORY_CARE_PROVIDER_SITE_OTHER): Payer: BC Managed Care – PPO | Admitting: Physician Assistant

## 2020-09-20 ENCOUNTER — Other Ambulatory Visit
Admission: RE | Admit: 2020-09-20 | Discharge: 2020-09-20 | Disposition: A | Discharge status: Home / Self Care | Payer: BC Managed Care – PPO | Source: Ambulatory Visit | Attending: Physician Assistant | Admitting: Physician Assistant

## 2020-09-20 ENCOUNTER — Encounter: Payer: Self-pay | Admitting: Physician Assistant

## 2020-09-20 VITALS — BP 162/102 | HR 74 | Ht 65.0 in | Wt 172.0 lb

## 2020-09-20 DIAGNOSIS — I10 Essential (primary) hypertension: Secondary | ICD-10-CM | POA: Diagnosis not present

## 2020-09-20 DIAGNOSIS — Z7901 Long term (current) use of anticoagulants: Secondary | ICD-10-CM | POA: Diagnosis not present

## 2020-09-20 DIAGNOSIS — Z7984 Long term (current) use of oral hypoglycemic drugs: Secondary | ICD-10-CM | POA: Diagnosis not present

## 2020-09-20 DIAGNOSIS — Z20822 Contact with and (suspected) exposure to covid-19: Secondary | ICD-10-CM | POA: Insufficient documentation

## 2020-09-20 DIAGNOSIS — I2510 Atherosclerotic heart disease of native coronary artery without angina pectoris: Secondary | ICD-10-CM

## 2020-09-20 DIAGNOSIS — I4819 Other persistent atrial fibrillation: Secondary | ICD-10-CM | POA: Diagnosis not present

## 2020-09-20 DIAGNOSIS — E119 Type 2 diabetes mellitus without complications: Secondary | ICD-10-CM | POA: Diagnosis not present

## 2020-09-20 DIAGNOSIS — Z01812 Encounter for preprocedural laboratory examination: Secondary | ICD-10-CM | POA: Insufficient documentation

## 2020-09-20 DIAGNOSIS — E785 Hyperlipidemia, unspecified: Secondary | ICD-10-CM | POA: Diagnosis not present

## 2020-09-20 DIAGNOSIS — Z8249 Family history of ischemic heart disease and other diseases of the circulatory system: Secondary | ICD-10-CM | POA: Diagnosis not present

## 2020-09-20 DIAGNOSIS — Z79899 Other long term (current) drug therapy: Secondary | ICD-10-CM | POA: Diagnosis not present

## 2020-09-20 DIAGNOSIS — Z880 Allergy status to penicillin: Secondary | ICD-10-CM | POA: Diagnosis not present

## 2020-09-20 DIAGNOSIS — Z87891 Personal history of nicotine dependence: Secondary | ICD-10-CM | POA: Diagnosis not present

## 2020-09-20 LAB — SARS CORONAVIRUS 2 (TAT 6-24 HRS): SARS Coronavirus 2: NEGATIVE

## 2020-09-20 MED ORDER — DILTIAZEM HCL 30 MG PO TABS: ORAL_TABLET | ORAL | 1 refills | Status: DC

## 2020-09-20 NOTE — Patient Instructions (Signed)
Medication Instructions:  - Your physician has recommended you make the following change in your medication:   1) START dilitiazem 30 mg- take 1 tablet by mouth three times a day as needed for heart rates > 130 bpm  *If you need a refill on your cardiac medications before your next appointment, please call your pharmacy*   Lab Work: - Pre Procedure COVID swab: Today (until 1:00 pm)  [facility] Drive up test only, staff will come out to the car to swab you  If you have labs (blood work) drawn today and your tests are completely normal, you will receive your results only by: Marland Kitchen MyChart Message (if you have MyChart) OR . A paper copy in the mail If you have any lab test that is abnormal or we need to change your treatment, we will call you to review the results.   Testing/Procedures: - Your physician has recommended that you have a Cardioversion (DCCV). Electrical Cardioversion uses a jolt of electricity to your heart either through paddles or wired patches attached to your chest. This is a controlled, usually prescheduled, procedure. Defibrillation is done under light anesthesia in the hospital, and you usually go home the day of the procedure. This is done to get your heart back into a normal rhythm. You are not awake for the procedure. Please see the instruction sheet given to you today.  You are scheduled for a Cardioversion on Thursday 09/22/20 with Dr. Mariah Milling.  Please [facility] at the [facility] at 7:00 a.m. on the day of your procedure.  DIET INSTRUCTIONS:  Nothing to eat or drink after midnight the night prior to your procedure         1) Labs: as above  2) Medications:  You may take all of your regular medications the morning of your procedure with enough water to get them down safely, unless listed below:  - HOLD metformin the morning of your procedure  3) Must have a responsible person to drive you home.  4) Bring a current list of your medications and  current insurance cards.    If you have any questions after you get home, please call the office at [phone]  Follow-Up: At [facility], you and your health needs are our priority.  As part of our continuing mission to provide you with exceptional heart care, we have created designated Provider Care Teams.  These Care Teams include your primary Cardiologist (physician) and Advanced Practice Providers (APPs -  Physician Assistants and Nurse Practitioners) who all work together to provide you with the care you need, when you need it.  We recommend signing up for the patient portal called "MyChart".  Sign up information is provided on this After Visit Summary.  MyChart is used to connect with patients for Virtual Visits (Telemedicine).  Patients are able to view lab/test results, encounter notes, upcoming appointments, etc.  Non-urgent messages can be sent to your provider as well.   To learn more about what you can do with MyChart, go to [email or web address].    Your next appointment:   3 week(s)  The format for your next appointment:   In Person  Provider:   You may see [name], MD or one of the following Advanced Practice Providers on your designated Care Team:    Nicolasa Ducking, NP  Eula Listen, PA-C  Marisue Ivan, PA-C  Cadence Ten Broeck, [place]  Gillian Shields, NP    Other Instructions

## 2020-09-22 ENCOUNTER — Encounter
Admission: RE | Disposition: A | Discharge status: Home / Self Care | Payer: Self-pay | Source: Home / Self Care | Attending: Cardiovascular Disease

## 2020-09-22 ENCOUNTER — Ambulatory Visit: Payer: BC Managed Care – PPO | Admitting: Anesthesiology

## 2020-09-22 ENCOUNTER — Other Ambulatory Visit: Payer: Self-pay

## 2020-09-22 ENCOUNTER — Encounter: Payer: Self-pay | Admitting: Cardiovascular Disease

## 2020-09-22 ENCOUNTER — Ambulatory Visit
Admission: RE | Admit: 2020-09-22 | Discharge: 2020-09-22 | Disposition: A | Discharge status: Home / Self Care | Payer: BC Managed Care – PPO | Attending: Cardiovascular Disease | Admitting: Cardiovascular Disease

## 2020-09-22 DIAGNOSIS — Z8249 Family history of ischemic heart disease and other diseases of the circulatory system: Secondary | ICD-10-CM | POA: Insufficient documentation

## 2020-09-22 DIAGNOSIS — Z880 Allergy status to penicillin: Secondary | ICD-10-CM | POA: Insufficient documentation

## 2020-09-22 DIAGNOSIS — I10 Essential (primary) hypertension: Secondary | ICD-10-CM | POA: Insufficient documentation

## 2020-09-22 DIAGNOSIS — I4819 Other persistent atrial fibrillation: Secondary | ICD-10-CM | POA: Insufficient documentation

## 2020-09-22 DIAGNOSIS — Z7901 Long term (current) use of anticoagulants: Secondary | ICD-10-CM | POA: Insufficient documentation

## 2020-09-22 DIAGNOSIS — I2510 Atherosclerotic heart disease of native coronary artery without angina pectoris: Secondary | ICD-10-CM | POA: Insufficient documentation

## 2020-09-22 DIAGNOSIS — Z7984 Long term (current) use of oral hypoglycemic drugs: Secondary | ICD-10-CM | POA: Diagnosis not present

## 2020-09-22 DIAGNOSIS — Z87891 Personal history of nicotine dependence: Secondary | ICD-10-CM | POA: Diagnosis not present

## 2020-09-22 DIAGNOSIS — Z20822 Contact with and (suspected) exposure to covid-19: Secondary | ICD-10-CM | POA: Insufficient documentation

## 2020-09-22 DIAGNOSIS — I4891 Unspecified atrial fibrillation: Secondary | ICD-10-CM | POA: Diagnosis not present

## 2020-09-22 DIAGNOSIS — E785 Hyperlipidemia, unspecified: Secondary | ICD-10-CM | POA: Diagnosis not present

## 2020-09-22 DIAGNOSIS — Z79899 Other long term (current) drug therapy: Secondary | ICD-10-CM | POA: Insufficient documentation

## 2020-09-22 DIAGNOSIS — E119 Type 2 diabetes mellitus without complications: Secondary | ICD-10-CM | POA: Diagnosis not present

## 2020-09-22 HISTORY — PX: CARDIOVERSION: SHX1299

## 2020-09-22 LAB — GLUCOSE, CAPILLARY: Glucose-Capillary: 163 mg/dL — ABNORMAL HIGH (ref 70–99)

## 2020-09-22 SURGERY — CARDIOVERSION
Anesthesia: General

## 2020-09-22 MED ORDER — SODIUM CHLORIDE 0.9 % IV SOLN: INTRAVENOUS | Status: DC | PRN

## 2020-09-22 MED ORDER — PROPOFOL 10 MG/ML IV BOLUS
INTRAVENOUS | Status: DC | PRN
  Administered 2020-09-22: 50 mg via INTRAVENOUS

## 2020-09-22 MED ORDER — PROPOFOL 10 MG/ML IV BOLUS
INTRAVENOUS | Status: AC
  Filled 2020-09-22: qty 20

## 2020-09-22 NOTE — Discharge Instructions (Signed)
Electrical Cardioversion Electrical cardioversion is the delivery of a jolt of electricity to restore a normal rhythm to the heart. A rhythm that is too fast or is not regular keeps the heart from pumping well. In this procedure, sticky patches or metal paddles are placed on the chest to deliver electricity to the heart from a device. This procedure may be done in an emergency if:  There is low or no blood pressure as a result of the heart rhythm.  Normal rhythm must be restored as fast as possible to protect the brain and heart from further damage.  It may save a life. This may also be a scheduled procedure for irregular or fast heart rhythms that are not immediately life-threatening. Tell a health care provider about:  Any allergies you have.  All medicines you are taking, including vitamins, herbs, eye drops, creams, and over-the-counter medicines.  Any problems you or family members have had with anesthetic medicines.  Any blood disorders you have.  Any surgeries you have had.  Any medical conditions you have.  Whether you are pregnant or may be pregnant. What are the risks? Generally, this is a safe procedure. However, problems may occur, including:  Allergic reactions to medicines.  A blood clot that breaks free and travels to other parts of your body.  The possible return of an abnormal heart rhythm within hours or days after the procedure.  Your heart stopping (cardiac arrest). This is rare. What happens before the procedure? Medicines  Your health care provider may have you start taking: ? Blood-thinning medicines (anticoagulants) so your blood does not clot as easily. ? Medicines to help stabilize your heart rate and rhythm.  Ask your health care provider about: ? Changing or stopping your regular medicines. This is especially important if you are taking diabetes medicines or blood thinners. ? Taking medicines such as aspirin and ibuprofen. These medicines can  thin your blood. Do not take these medicines unless your health care provider tells you to take them. ? Taking over-the-counter medicines, vitamins, herbs, and supplements. General instructions  Follow instructions from your health care provider about eating or drinking restrictions.  Plan to have someone take you home from the hospital or clinic.  If you will be going home right after the procedure, plan to have someone with you for 24 hours.  Ask your health care provider what steps will be taken to help prevent infection. These may include washing your skin with a germ-killing soap. What happens during the procedure?   An IV will be inserted into one of your veins.  Sticky patches (electrodes) or metal paddles may be placed on your chest.  You will be given a medicine to help you relax (sedative).  An electrical shock will be delivered. The procedure may vary among health care providers and hospitals. What can I expect after the procedure?  Your blood pressure, heart rate, breathing rate, and blood oxygen level will be monitored until you leave the hospital or clinic.  Your heart rhythm will be watched to make sure it does not change.  You may have some redness on the skin where the shocks were given. Follow these instructions at home:  Do not drive for 24 hours if you were given a sedative during your procedure.  Take over-the-counter and prescription medicines only as told by your health care provider.  Ask your health care provider how to check your pulse. Check it often.  Rest for 48 hours after the procedure or   as told by your health care provider.  Avoid or limit your caffeine use as told by your health care provider.  Keep all follow-up visits as told by your health care provider. This is important. Contact a health care provider if:  You feel like your heart is beating too quickly or your pulse is not regular.  You have a serious muscle cramp that does not go  away. Get help right away if:  You have discomfort in your chest.  You are dizzy or you feel faint.  You have trouble breathing or you are short of breath.  Your speech is slurred.  You have trouble moving an arm or leg on one side of your body.  Your fingers or toes turn cold or blue. Summary  Electrical cardioversion is the delivery of a jolt of electricity to restore a normal rhythm to the heart.  This procedure may be done right away in an emergency or may be a scheduled procedure if the condition is not an emergency.  Generally, this is a safe procedure.  After the procedure, check your pulse often as told by your health care provider. This information is not intended to replace advice given to you by your health care provider. Make sure you discuss any questions you have with your health care provider. Document Revised: 04/06/2019 Document Reviewed: 04/06/2019 Elsevier Patient Education  2020 [employer]  

## 2020-09-22 NOTE — Transfer of Care (Signed)
Immediate Anesthesia Transfer of Care Note  Patient: Clinton Dorsey  Procedure(s) Performed: CARDIOVERSION (N/A )  Patient Location: PACU and Cath Lab  Anesthesia Type:General  Level of Consciousness: drowsy  Airway & Oxygen Therapy: Patient Spontanous Breathing and Patient connected to nasal cannula oxygen  Post-op Assessment: Report given to RN  Post vital signs: stable  Last Vitals:  Vitals Value Taken Time  BP 134/92 09/22/20 0750  Temp    Pulse 44 09/22/20 0752  Resp 26 09/22/20 0752  SpO2 95 % 09/22/20 0752    Last Pain:  Vitals:   09/22/20 0722  PainSc: 0-No pain         Complications: No complications documented.

## 2020-09-22 NOTE — CV Procedure (Signed)
Cardioversion procedure note For atrial fibrillation, persistent.  Procedure Details:  Consent: Risks of procedure as well as the alternatives and risks of each were explained to the (patient/caregiver).  Consent for procedure obtained.  Time Out: Verified patient identification, verified procedure, site/side was marked, verified correct patient position, special equipment/implants available, medications/allergies/relevent history reviewed, required imaging and test results available.  Performed  Patient placed on cardiac monitor, pulse oximetry, supplemental oxygen as necessary.   Sedation given: propofol IV, Dr. Zak Pacer pads placed anterior and posterior chest.   Cardioverted 1 time(s).   Cardioverted at  150 J. Synchronized biphasic Converted to NSR   Evaluation: Findings: Post procedure EKG shows: NSR Complications: None Patient did tolerate procedure well.  Time Spent Directly with the Patient:  45 minutes   Tim Claudy Abdallah, M.D., Ph.D.  

## 2020-09-22 NOTE — Anesthesia Preprocedure Evaluation (Addendum)
Anesthesia Evaluation  Patient identified by MRN, date of birth, ID band Patient awake    Reviewed: Allergy & Precautions, NPO status , Patient's Chart, lab work & pertinent test results  History of Anesthesia Complications Negative for: history of anesthetic complications  Airway Mallampati: II  TM Distance: >3 FB Neck ROM: Full    Dental no notable dental hx. (+) Teeth Intact   Pulmonary neg sleep apnea, neg COPD, Patient abstained from smoking.Not current smoker, former smoker,  Pt supposed to get a sleep study soon for evaluation of HTN. Denies anyone telling him that he stops breathing at night, or waking up at night choking.   Pulmonary exam normal breath sounds clear to auscultation       Cardiovascular Exercise Tolerance: Good METShypertension, Pt. on medications + CAD, + Past MI and + Cardiac Stents  + dysrhythmias Atrial Fibrillation  Rhythm:Irregular Rate:Normal - Systolic murmurs TTE 12/21: 1. Left ventricular ejection fraction, by estimation, is 55 to 60%. The  left ventricle has normal function. The left ventricle has no regional  wall motion abnormalities. There is mild left ventricular hypertrophy.  Left ventricular diastolic parameters  are indeterminate.  2. Right ventricular systolic function is low normal. The right  ventricular size is mildly enlarged.  3. Left atrial size was severely dilated.  4. Right atrial size was moderately dilated.  5. The mitral valve is normal in structure. Mild mitral valve  regurgitation.  6. The aortic valve is tricuspid. Aortic valve regurgitation is not  visualized. Mild aortic valve sclerosis is present, with no evidence of  aortic valve stenosis.  7. The inferior vena cava is normal in size with greater than 50%  respiratory variability, suggesting right atrial pressure of 3 mmHg.    Neuro/Psych PSYCHIATRIC DISORDERS Anxiety Depression negative neurological ROS      GI/Hepatic neg GERD  ,(+)     (-) substance abuse  ,   Endo/Other  diabetes  Renal/GU negative Renal ROS     Musculoskeletal   Abdominal   Peds  Hematology   Anesthesia Other Findings Past Medical History: No date: Arrhythmia No date: Coronary artery disease     Comment:  a. NSTEMI; b. 12/23/13 cath 11/2013: D1 80%, LCx 90% failed              PCI, dRCA 99%, PL branch 99% s/p PCI/DES,  EF 55%; c.               staged cardiac cath 12/2013: s/p PCI/DES to both D1 and               mLCx, resdial LM 40%, ostial LCx 50% No date: Depression No date: Depression No date: Diabetes mellitus (HCC)     Comment:  a. poorly controlled No date: Hyperlipidemia No date: Hypertension No date: Hypokalemia     Comment:  a. recurrent No date: Kidney stones 11/2013: MI (myocardial infarction) (HCC) No date: Mitral regurgitation     Comment:  a. echo 11/2013: EF 55-60%, mild LVH, mildly dilated LA,               mild TR, mild to mod MR, mildly elevated RVSP   No date: Obesity No date: Tobacco abuse  Reproductive/Obstetrics                            Anesthesia Physical Anesthesia Plan  ASA: II  Anesthesia Plan: General   Post-op Pain Management:  Induction: Intravenous  PONV Risk Score and Plan: 2 and Ondansetron, Propofol infusion and TIVA  Airway Management Planned: Nasal Cannula  Additional Equipment: None  Intra-op Plan:   Post-operative Plan:   Informed Consent: I have reviewed the patients History and Physical, chart, labs and discussed the procedure including the risks, benefits and alternatives for the proposed anesthesia with the patient or authorized representative who has indicated his/her understanding and acceptance.     Dental advisory given  Plan Discussed with: CRNA and Surgeon  Anesthesia Plan Comments: (Discussed risks of anesthesia with patient, including possibility of difficulty with spontaneous ventilation under anesthesia  necessitating airway intervention, PONV, and rare risks such as cardiac or respiratory or neurological events. Patient understands.)        Anesthesia Quick Evaluation

## 2020-09-23 NOTE — Anesthesia Postprocedure Evaluation (Signed)
Anesthesia Post Note  Patient: Clinton Dorsey  Procedure(s) Performed: CARDIOVERSION (N/A )  Patient location during evaluation: Specials Recovery Anesthesia Type: General Level of consciousness: awake and alert Pain management: pain level controlled Vital Signs Assessment: post-procedure vital signs reviewed and stable Respiratory status: spontaneous breathing, nonlabored ventilation, respiratory function stable and patient connected to nasal cannula oxygen Cardiovascular status: blood pressure returned to baseline and stable Postop Assessment: no apparent nausea or vomiting Anesthetic complications: no   No complications documented.   Last Vitals:  Vitals:   09/22/20 0755 09/22/20 0810  BP: 107/71 117/74  Pulse: (!) 54   Resp: 18 16  Temp:    SpO2: 96% 93%    Last Pain:  Vitals:   09/22/20 0810  PainSc: 0-No pain                 Corinda Gubler

## 2020-09-25 NOTE — Interval H&P Note (Signed)
History and Physical Interval Note:  09/25/2020 4:52 PM  Clinton Dorsey  has presented today for surgery, with the diagnosis of Cardioversion   Afib  OK second case per Dr Pernell Dupre  Start time 8a per [facility].  The various methods of treatment have been discussed with the patient and family. After consideration of risks, benefits and other options for treatment, the patient has consented to  Procedure(s): CARDIOVERSION (N/A) as a surgical intervention.  The patient's history has been reviewed, patient examined, no change in status, stable for surgery.  I have reviewed the patient's chart and labs.  Questions were answered to the patient's satisfaction.     [name]

## 2020-09-25 NOTE — H&P (Signed)
H&P Addendum, pre-cardioversion  Patient was seen and evaluated prior to -cardioversion procedure Symptoms, prior testing details again confirmed with the patient Patient examined, no significant change from prior exam Lab work reviewed in detail personally by myself Patient understands risk and benefit of the procedure, willing to proceed  Signed, Tim Chelsei Mcchesney, MD, Ph.D [facility]  

## 2020-09-26 ENCOUNTER — Telehealth: Payer: Self-pay | Admitting: Cardiovascular Disease

## 2020-09-26 NOTE — Telephone Encounter (Signed)
STAT if HR is under 50 or over 120 (normal HR is 60-100 beats per minute)  What is your heart rate? 47  1) Do you have a log of your heart rate readings (document readings)?  Today 50 and then dropped to 47  Do you have any other symptoms? Just weak and tired  Please call to discuss what patient should do regarding his medication.

## 2020-09-26 NOTE — Telephone Encounter (Signed)
Spoke with pt, reports HR average has been 43, his usual is 55, reports feeling weak. Stated was recently cardioverted and placed on 240mg  of diltiazem by Dr. . Was told if HR dropped below 50 then to call Dr. Lucianne Muss and reduce Cardizem back to regular dose. Pt reports was taken diltiazem 120mg  daily and will revert back to that dosing d/t low HR and weakness, will follow-up with Mariah Milling, PA-C on 1/13 at 11:00am as schedule. Advised pt if his HR continues to be in the 40s, sudden CP, dizziness, diaphoresis, or shob then need to seek medical attention quickly. Pt verbalized understanding, reports no CP or other symptoms at this time other then weakness. Otherwise all questions or concerns were address and no additional concerns at this time. Agreeable to plan, will call back for anything further.

## 2020-09-28 NOTE — Progress Notes (Signed)
Office Visit    Patient Name: Clinton Dorsey Date of Encounter: 09/29/2020  Primary Care Provider:  [name], MD Primary Cardiologist:  [name], MD Electrophysiologist:  None   Chief Complaint    Clinton Dorsey is a 70 y.o. male with a hx of CAD s/p NSTEMI 11/2013 s/p DES to RPL2 12/09/13 and staged PCI to mid LCx and D1 12/23/2013, DM2, persistent atrial fibrillation on Eliquis, HTN, HLD, tobacco use, obesity presents today for follow up after cardioversion.   Past Medical History    Past Medical History:  Diagnosis Date  . Arrhythmia   . Coronary artery disease    a. NSTEMI; b. 12/23/13 cath 11/2013: D1 80%, LCx 90% failed PCI, dRCA 99%, PL branch 99% s/p PCI/DES,  EF 55%; c. staged cardiac cath 12/2013: s/p PCI/DES to both D1 and mLCx, resdial LM 40%, ostial LCx 50%  . Depression   . Depression   . Diabetes mellitus (HCC)    a. poorly controlled  . Hyperlipidemia   . Hypertension   . Hypokalemia    a. recurrent  . Kidney stones   . MI (myocardial infarction) (HCC) 11/2013  . Mitral regurgitation    a. echo 11/2013: EF 55-60%, mild LVH, mildly dilated LA, mild TR, mild to mod MR, mildly elevated RVSP    . Obesity   . Tobacco abuse    Past Surgical History:  Procedure Laterality Date  . CARDIAC CATHETERIZATION  12/08/2013  . CARDIOVERSION N/A 09/22/2020   Procedure: CARDIOVERSION;  Surgeon: Antonieta Iba, MD;  Location: [facility] ORS;  Service: Cardiovascular;  Laterality: N/A;  . CORONARY ANGIOPLASTY  12/08/2013   s/p stent placement  . HERNIA REPAIR    . LAMINECTOMY    . PERCUTANEOUS CORONARY STENT INTERVENTION (PCI-S) N/A 12/23/2013   Procedure: PERCUTANEOUS CORONARY STENT INTERVENTION (PCI-S);  Surgeon: [name], MD;  Location: [facility] CATH LAB;  Service: Cardiovascular;  Laterality: N/A;  . SPINE SURGERY  2014  . TONSILLECTOMY      Allergies  Allergies  Allergen Reactions  . Spironolactone Other (See Comments)    Gynomastia  . Penicillins  Hives and Rash       . Atorvastatin Other (See Comments)    Myalgias   . Crestor [Rosuvastatin Calcium] Other (See Comments)    Myalgias   . Simvastatin Other (See Comments)    Myalgias    History of Present Illness    Clinton Dorsey is a 70 y.o. male with a hx of CAD s/p NSTEMI 11/2013 s/p DES to RPL2 12/09/13 and staged PCI to mid LCx and D1 12/23/2013, DM2, persistent atrial fibrillation on Eliquis, HTN, HLD, tobacco use, obesity last seen for cardioversion 09/22/2020  Admitted to [facility] 11/2013 for NSTEMI. Echo with LVEF 55-60%, mild LVH, mild dilated LA, mild TR, mild-moderate MR, RVSP mildly elevated. Cardiac cath with PCI/DES to distal RCA/osital PL branch and staged PCI 12/2013 at [facility] with DES to mic LCx and D1. Noted residual 40% left main stenosis and 50% ostial LCx stenosis.   Seen 10/2015 with palpitations. Echo at that time LVEF 55-60%, mild concentric LVH, gr2DD, moderate MR, moderately dilated LA, PASP . 40 hour holter SR with frequent PAC/PVC, rare short runs of narrow complex tachycardia and NSVT. Repeat 48 hour Holter 10/2015 with SR, PAC/PVC with runs of bigmeniny, short episode idioventricular rhythm, several episodes of SVT.   Seen in ED 07/2019 and diagnosed with atrial fibrillation. Placed on Eliquis and converted to SR with diltiazem. Office visit  08/17/20 with improved though still elevated BP readings and EKG SB 59bpm. Underlying sinus bradycardia had precluded escalation of Diltiazem.   Admitted 09/07/20-09/08/20 with atrial fib with RVR. Echo with LVEF 55-60%, no RWMA, mild LVH, indeterminite LV diastolic parameters, low normal RVSF with mildly enlarged RV cavity size, severely dilated LA 22mm, moderately dilated right atrium, mild MR, mild aortic valve sclerosis without stenosis. He remained in atrial fibrillation and his Diltiazem was titrated.   Seen in follow up 09/20/20 and scheduled for cardioversion. Underwent cardioversion with Dr. Mariah Milling 09/22/20. Called the office  09/26/20 nothing HR in the 40s. Recommended to reduce dose of Diltiazem from 240mg  to 120mg .   Presents today for followup.  Enjoys working part-time at a local [employer].  Tells me he recently ran out of members at and has been walking on the treadmill for exercise.  He reports heart rates consistently in the 50s and 60s since reducing his dose of diltiazem to 120 mg daily.  Denies palpitations.  No chest pain, nausea, tightness.  No shortness of breath or dyspnea on exertion.  Checking blood pressure at home with readings routinely 130-140 over 80s.  Is very excited as he and his spouse are leaving for a cruise next week.  EKGs/Labs/Other Studies Reviewed:   The following studies were reviewed today: 2D echo 12/22/201: 1. Left ventricular ejection fraction, by estimation, is 55 to 60%. The  left ventricle has normal function. The left ventricle has no regional  wall motion abnormalities. There is mild left ventricular hypertrophy.  Left ventricular diastolic parameters  are indeterminate.   2. Right ventricular systolic function is low normal. The right  ventricular size is mildly enlarged.   3. Left atrial size was severely dilated.   4. Right atrial size was moderately dilated.   5. The mitral valve is normal in structure. Mild mitral valve  regurgitation.   6. The aortic valve is tricuspid. Aortic valve regurgitation is not  visualized. Mild aortic valve sclerosis is present, with no evidence of  aortic valve stenosis.   7. The inferior vena cava is normal in size with greater than 50%  respiratory variability, suggesting right atrial pressure of 3 mmHg. __________   48-hour Holter 10/2015: Average heart rate 65 bpm, maximum heart rate 121 bpm Normal sinus rhythm with APCs and PVCs, including couplets, runs of bigeminy Short episodes of Idioventricular escape rhythm noted rate of 53 bpm Several episodes of episodes of supraventricular tachycardia, 6  beats __________   48-hour Holter 10/2015: Normal sinus rhythm, Frequent APCs and PVCs, Rare short runs of narrow complex tachycardia, Rare short runs of NSVT __________   2D echo 10/2015: - Left ventricle: The cavity size was mildly dilated. There was    mild concentric hypertrophy. Systolic function was normal. The    estimated ejection fraction was in the range of 55% to 60%. Wall    motion was normal; there were no regional wall motion    abnormalities. Features are consistent with a pseudonormal left    ventricular filling pattern, with concomitant abnormal relaxation    and increased filling pressure (grade 2 diastolic dysfunction).  - Mitral valve: There was moderate regurgitation.  - Left atrium: The atrium was moderately dilated.  - Pulmonary arteries: Systolic pressure was mildly increased. PA    peak pressure: 40 mm Hg (S). __________   [facility] 12/2013: PCI to mid LCx and D1 __________   [facility] 11/2013: D1 80%, mid LCx 90%, RPL2 99% s/p PCI/DES  EKG:  EKG is ordered today.  The ekg ordered today demonstrates sinus bradycardia with sinus arrhythmia 59 bpm, unable to exclude prior septal infarct, incomplete right bundle branch block, no acute ST/T wave changes.  Recent Labs: 09/07/2020: BUN 23; Creatinine, Ser 1.12; Hemoglobin 12.5; Platelets 261; Potassium 3.7; Sodium 134; TSH 1.782  Recent Lipid Panel    Component Value Date/Time   CHOL 121 11/24/2018 1453   CHOL 164 07/10/2016 1525   CHOL 182 12/07/2013 0956   TRIG 133 11/24/2018 1453   TRIG 108 07/10/2016 1525   TRIG 224 (H) 12/07/2013 0956   HDL 39 (L) 11/24/2018 1453   HDL 33 (L) 12/07/2013 0956   CHOLHDL 3.1 11/24/2018 1453   VLDL 22 07/10/2016 1525   VLDL 45 (H) 12/07/2013 0956   LDLCALC 55 11/24/2018 1453   LDLCALC 104 (H) 12/07/2013 0956    Home Medications   Current Meds  Medication Sig  . buPROPion (WELLBUTRIN XL) 150 MG 24 hr tablet Take 1 tablet (150 mg total) by mouth daily.  . carvedilol (COREG)  25 MG tablet Take 25 mg by mouth 2 (two) times daily.  . cholecalciferol (VITAMIN D3) 25 MCG (1000 UNIT) tablet Take 1,000 Units by mouth daily.  . Coenzyme Q10 10 MG capsule Take 10 mg by mouth daily.   Marland Kitchen diltiazem (CARDIZEM) 30 MG tablet Take 1 tablet (30 mg) by mouth three times a day as needed for heart rates > 130 bpm  . ELIQUIS 5 MG TABS tablet TAKE 1 TABLET(5 MG) BY MOUTH TWICE DAILY  . ezetimibe (ZETIA) 10 MG tablet TAKE 1 TABLET(10 MG) BY MOUTH DAILY  . Glucose Blood (BLOOD GLUCOSE TEST STRIPS) STRP 1 strip by In Vitro route daily.  Marland Kitchen losartan (COZAAR) 100 MG tablet TAKE 1 TABLET(100 MG) BY MOUTH DAILY  . magnesium oxide (MAG-OX) 400 MG tablet Take 400 mg by mouth daily.  . metFORMIN (GLUCOPHAGE) 500 MG tablet Take 500 mg by mouth daily with supper.  . Multiple Vitamin (MULTIVITAMIN) capsule Take 1 capsule by mouth daily.  . rosuvastatin (CRESTOR) 20 MG tablet Take 20 mg by mouth every evening.  . sildenafil (REVATIO) 20 MG tablet TAKE 1-5 TABLETS BY MOUTH DAILY AS NEEDED  . [DISCONTINUED] diltiazem (CARTIA XT) 240 MG 24 hr capsule Take 1 capsule (240 mg total) by mouth daily.    Review of Systems   Review of Systems  Constitutional: Negative for chills, fever and malaise/fatigue.  Cardiovascular: Negative for chest pain, dyspnea on exertion, irregular heartbeat, leg swelling, near-syncope, orthopnea, palpitations and syncope.  Respiratory: Negative for cough, shortness of breath and wheezing.   Gastrointestinal: Negative for melena, nausea and vomiting.  Genitourinary: Negative for hematuria.  Neurological: Negative for dizziness, light-headedness and weakness.  Psychiatric/Behavioral: The patient is nervous/anxious.    All other systems reviewed and are otherwise negative except as noted above.  Physical Exam    VS:  BP (!) 170/90   Pulse (!) 59   Ht 5\' 5"  (1.651 m)   Wt 171 lb (77.6 kg)   BMI 28.46 kg/m  , BMI Body mass index is 28.46 kg/m.  Wt Readings from Last 3  Encounters:  09/29/20 171 lb (77.6 kg)  09/20/20 172 lb (78 kg)  09/07/20 173 lb (78.5 kg)    GEN: Well nourished, well developed, in no acute distress. HEENT: normal. Neck: Supple, no JVD, carotid bruits, or masses. Cardiac: bradycardia, RRR, no murmurs, rubs, or gallops. No clubbing, cyanosis, edema.  Radials/DP/PT 2+ and equal bilaterally.  Respiratory:  Respirations regular and unlabored, clear to auscultation bilaterally. GI: Soft, nontender, nondistended. MS: No deformity or atrophy. Skin: Warm and dry, no rash. Neuro:  Strength and sensation are intact. Psych: Normal affect.  Assessment & Plan    1. Persistent atrial fibrillation on chronic anticoagulation -maintaining sinus bradycardia post cardioversion.  He had bradycardia with heart rate in the 40s on diltiazem 240 mg daily and has reduced 120 mg daily, will continue diltiazem 120 mg daily and refill provided.  His heart rate is routinely 50s to 60s and he feels well.  Continue Coreg 25 mg twice daily.  Continue Eliquis 5 mg twice daily.  Does not meet dose reduction criteria.  Denies bleeding complications.  2. CAD -stable with no anginal symptoms.  EKG today with no acute ST/T wave changes.  GDMT includes beta-blocker, statin.  No aspirin secondary to chronic anticoagulation.  3. HTN -BP elevated.  Continue losartan 100 mg daily, diltiazem 120 mg daily, Coreg 25 mg twice daily.  Start chlorthalidone 25 mg daily.  Start potassium 20 mEq daily to prevent hypokalemia.  CMP today and repeat BMP in 1 week.  4. HLD, LDL goal <70 -05/2020 LDL 77.  He has been back on his Crestor 20 mg and Zetia 10 mg for greater than 8 weeks. CMP, Fasting lipid panel today.   Disposition: Follow up 10/19/19 with Eula Listen, PA as previously scheduled.  Signed, Alver Sorrow, NP 09/29/2020, 9:01 AM [facility]

## 2020-09-29 ENCOUNTER — Other Ambulatory Visit: Payer: Self-pay

## 2020-09-29 ENCOUNTER — Ambulatory Visit (INDEPENDENT_AMBULATORY_CARE_PROVIDER_SITE_OTHER): Payer: BC Managed Care – PPO | Admitting: Family

## 2020-09-29 ENCOUNTER — Encounter: Payer: Self-pay | Admitting: Family

## 2020-09-29 VITALS — BP 170/90 | HR 59 | Ht 65.0 in | Wt 171.0 lb

## 2020-09-29 DIAGNOSIS — E785 Hyperlipidemia, unspecified: Secondary | ICD-10-CM

## 2020-09-29 DIAGNOSIS — I4819 Other persistent atrial fibrillation: Secondary | ICD-10-CM | POA: Diagnosis not present

## 2020-09-29 DIAGNOSIS — I10 Essential (primary) hypertension: Secondary | ICD-10-CM

## 2020-09-29 DIAGNOSIS — Z7901 Long term (current) use of anticoagulants: Secondary | ICD-10-CM | POA: Diagnosis not present

## 2020-09-29 DIAGNOSIS — I25118 Atherosclerotic heart disease of native coronary artery with other forms of angina pectoris: Secondary | ICD-10-CM | POA: Diagnosis not present

## 2020-09-29 MED ORDER — POTASSIUM CHLORIDE CRYS ER 20 MEQ PO TBCR: 20.0000 meq | EXTENDED_RELEASE_TABLET | Freq: Every day | ORAL | 3 refills | Status: DC

## 2020-09-29 MED ORDER — CHLORTHALIDONE 25 MG PO TABS: 25.0000 mg | ORAL_TABLET | Freq: Every day | ORAL | 3 refills | Status: DC

## 2020-09-29 MED ORDER — DILTIAZEM HCL ER COATED BEADS 120 MG PO CP24: 120.0000 mg | ORAL_CAPSULE | Freq: Every day | ORAL | 3 refills | Status: DC

## 2020-09-29 NOTE — Patient Instructions (Addendum)
Medication Instructions:  Your physician has recommended you make the following change in your medication:   CHANGE Diltiazem to 120mg  daily  START Chlorthalidone 25mg  daily  START Potassium 20 mEq daily  *If you need a refill on your cardiac medications before your next appointment, please call your pharmacy*  Lab Work: Your physician recommends that you return for lab work today: CMP, lipid panel  Your physician recommends that you return for lab work (BMET) in 1 week at the . No appointment is needed for this.    If you have labs (blood work) drawn today and your tests are completely normal, you will receive your results only by: MyChart Message (if you have MyChart) OR . A paper copy in the mail If you have any lab test that is abnormal or we need to change your treatment, we will call you to review the results.   Testing/Procedures: Your EKG today shows sinus bradycardia which is a good result!  Follow-Up: At [facility], you and your health needs are our priority.  As part of our continuing mission to provide you with exceptional heart care, we have created designated Provider Care Teams.  These Care Teams include your primary Cardiologist (physician) and Advanced Practice Providers (APPs -  Physician Assistants and Nurse Practitioners) who all work together to provide you with the care you need, when you need it.   Your next appointment:   As scheduled 10/18/2020  Other Instructions  Chlorthalidone Oral Tablets What is this medicine? CHLORTHALIDONE (klor THAL i done) is a diuretic. It helps you make more urine and to lose salt and excess water from your body. It treats swelling from heart, kidney, or liver disease. It also treats high blood pressure. This medicine may be used for other purposes; ask your health care provider or pharmacist if you have questions. COMMON BRAND NAME(S): Thalitone What should I tell my health care provider before I take  this medicine? They need to know if you have any of these conditions:  diabetes, high blood sugar  gout  kidney disease  liver disease  lung or breathing disease (asthma)  lupus  an unusual or allergic reaction to chlorthalidone, sulfa drugs, other drugs, foods, dyes or preservatives  pregnant or trying to get pregnant  breast-feeding How should I use this medicine? Take this drug by mouth. Take it as directed on the prescription label at the same time every day. Take it with food. Keep taking it unless your health care provider tells you to stop. Talk to your health care provider about the use of this drug in children. Special care may be needed. Overdosage: If you think you have taken too much of this medicine contact a poison control center or emergency room at once. NOTE: This medicine is only for you. Do not share this medicine with others. What if I miss a dose? If you miss a dose, take it as soon as you can. If it is almost time for your next dose, take only that dose. Do not take double or extra doses. What may interact with this medicine?  barbiturate medicines for sleep or seizure control  digoxin  lithium  medicines for diabetes  norepinephrine  other medicines for high blood pressure  some pain medicines  steroid hormones like prednisone, cortisone, hydrocortisone, corticotropin  tubocurarine This list may not describe all possible interactions. Give your health care provider a list of all the medicines, herbs, non-prescription drugs, or dietary supplements you use.  Also tell them if you smoke, drink alcohol, or use illegal drugs. Some items may interact with your medicine. What should I watch for while using this medicine? Visit your health care provider for regular checks on your progress. Check your blood pressure as directed. Ask your health care provider what your blood pressure should be. Also, find out when you should contact him or her. Check with  your health care provider if you have severe diarrhea, nausea, and vomiting, or if you sweat a lot. The loss of too much body fluid may make it dangerous for you to take this drug. You may need to be on a special diet while you are taking this drug. Ask your health care provider. Also, find out how many glasses of fluids you need to drink each day. Do not treat yourself for coughs, colds, or pain while you are using this drug without asking your health care provider for advice. Some drugs may increase your blood pressure. You may get drowsy or dizzy. Do not drive, use machinery, or do anything that needs mental alertness until you know how this drug affects you. Do not stand up or sit up quickly, especially if you are an older patient. This reduces the risk of dizzy or fainting spells. Alcohol may interfere with the effect of this drug. Avoid alcoholic drinks. What side effects may I notice from receiving this medicine? Side effects that you should report to your doctor or health care provider as soon as possible:  allergic reactions (skin rash, itching or hives; swelling of the face, lips, or tongue)  kidney injury (trouble passing urine or change in the amount of urine)  low blood pressure (dizziness; feeling faint or lightheaded, falls; unusually weak or tired)  low potassium levels (trouble breathing; chest pain; dizziness; fast, irregular heartbeat; feeling faint or lightheaded, falls; muscle cramps or pain)  redness, blistering, peeling, or loosening of the skin, including inside the mouth  unusual bruising or bleeding Side effects that usually do not require medical attention (report to your doctor or health care provider if they continue or are bothersome):  constipation  diarrhea  headache  increased thirst  loss of appetite  low red blood cell counts (trouble breathing; feeling faint; lightheaded, falls; unusually weak or tired)  unusual sweating  vomiting This list may  not describe all possible side effects. Call your doctor for medical advice about side effects. You may report side effects to FDA at 1-800-FDA-1088. Where should I keep my medicine? Keep out of the reach of children and pets. Store at room temperature between 20 and 25 degrees C (68 and 77 degrees F). Protect from light. Throw away any unused drug after the expiration date. NOTE: This sheet is a summary. It may not cover all possible information. If you have questions about this medicine, talk to your doctor, pharmacist, or health care provider.  2021 Elsevier/Gold Standard (2019-06-23 14:45:31)

## 2020-09-30 ENCOUNTER — Other Ambulatory Visit: Payer: Self-pay | Admitting: Cardiovascular Disease

## 2020-09-30 LAB — SPECIMEN STATUS

## 2020-10-02 LAB — COMPREHENSIVE METABOLIC PANEL
ALT: 40 IU/L (ref 0–44)
AST: 24 IU/L (ref 0–40)
Albumin/Globulin Ratio: 1.6 (ref 1.2–2.2)
Albumin: 4.2 g/dL (ref 3.8–4.8)
Alkaline Phosphatase: 78 IU/L (ref 44–121)
BUN/Creatinine Ratio: 27 — ABNORMAL HIGH (ref 10–24)
BUN: 27 mg/dL (ref 8–27)
Bilirubin Total: 0.6 mg/dL (ref 0.0–1.2)
CO2: 21 mmol/L (ref 20–29)
Calcium: 9.5 mg/dL (ref 8.6–10.2)
Chloride: 104 mmol/L (ref 96–106)
Creatinine, Ser: 0.99 mg/dL (ref 0.76–1.27)
GFR calc Af Amer: 89 mL/min/{1.73_m2} (ref >59)
GFR calc non Af Amer: 77 mL/min/{1.73_m2} (ref >59)
Globulin, Total: 2.7 g/dL (ref 1.5–4.5)
Glucose: 138 mg/dL — ABNORMAL HIGH (ref 65–99)
Potassium: 4.2 mmol/L (ref 3.5–5.2)
Sodium: 140 mmol/L (ref 134–144)
Total Protein: 6.9 g/dL (ref 6.0–8.5)

## 2020-10-02 LAB — LIPID PANEL
Chol/HDL Ratio: 3 ratio (ref 0.0–5.0)
Cholesterol, Total: 114 mg/dL (ref 100–199)
HDL: 38 mg/dL — ABNORMAL LOW (ref >39)
LDL Chol Calc (NIH): 57 mg/dL (ref 0–99)
Triglycerides: 102 mg/dL (ref 0–149)
VLDL Cholesterol Cal: 19 mg/dL (ref 5–40)

## 2020-10-02 LAB — SPECIMEN STATUS REPORT

## 2020-10-05 ENCOUNTER — Other Ambulatory Visit: Payer: Self-pay | Admitting: Cardiovascular Disease

## 2020-10-05 NOTE — Telephone Encounter (Signed)
Rx request sent to pharmacy.  

## 2020-10-18 ENCOUNTER — Ambulatory Visit: Payer: BC Managed Care – PPO | Admitting: Physician Assistant

## 2020-10-18 DIAGNOSIS — E119 Type 2 diabetes mellitus without complications: Secondary | ICD-10-CM | POA: Diagnosis not present

## 2020-10-20 DIAGNOSIS — R400 Somnolence: Secondary | ICD-10-CM | POA: Diagnosis not present

## 2020-10-20 DIAGNOSIS — G4710 Hypersomnia, unspecified: Secondary | ICD-10-CM | POA: Diagnosis not present

## 2020-10-22 DIAGNOSIS — G4733 Obstructive sleep apnea (adult) (pediatric): Secondary | ICD-10-CM | POA: Diagnosis not present

## 2020-10-24 ENCOUNTER — Ambulatory Visit (INDEPENDENT_AMBULATORY_CARE_PROVIDER_SITE_OTHER): Payer: BC Managed Care – PPO | Admitting: Family

## 2020-10-24 ENCOUNTER — Encounter: Payer: Self-pay | Admitting: Family

## 2020-10-24 ENCOUNTER — Other Ambulatory Visit: Payer: Self-pay

## 2020-10-24 VITALS — BP 162/98 | HR 62 | Ht 65.0 in | Wt 173.5 lb

## 2020-10-24 DIAGNOSIS — I25118 Atherosclerotic heart disease of native coronary artery with other forms of angina pectoris: Secondary | ICD-10-CM

## 2020-10-24 DIAGNOSIS — E785 Hyperlipidemia, unspecified: Secondary | ICD-10-CM

## 2020-10-24 DIAGNOSIS — I4819 Other persistent atrial fibrillation: Secondary | ICD-10-CM | POA: Diagnosis not present

## 2020-10-24 DIAGNOSIS — Z7901 Long term (current) use of anticoagulants: Secondary | ICD-10-CM | POA: Diagnosis not present

## 2020-10-24 DIAGNOSIS — I10 Essential (primary) hypertension: Secondary | ICD-10-CM

## 2020-10-24 NOTE — Progress Notes (Signed)
Office Visit    Patient Name: Clinton Dorsey Date of Encounter: 10/24/2020  Primary Care Provider:  [name], MD Primary Cardiologist:  [name], MD Electrophysiologist:  None   Chief Complaint    Clinton Dorsey is a 70 y.o. male with a hx of CAD s/p NSTEMI 11/2013 s/p DES to RPL2 12/09/13 and staged PCI to mid LCx and D1 12/23/2013, DM2, persistent atrial fibrillation on Eliquis, HTN, HLD, tobacco use, obesity presents today for follow up after addition of chlorthalidone  Past Medical History    Past Medical History:  Diagnosis Date  . Arrhythmia   . Coronary artery disease    a. NSTEMI; b. 12/23/13 cath 11/2013: D1 80%, LCx 90% failed PCI, dRCA 99%, PL branch 99% s/p PCI/DES,  EF 55%; c. staged cardiac cath 12/2013: s/p PCI/DES to both D1 and mLCx, resdial LM 40%, ostial LCx 50%  . Depression   . Depression   . Diabetes mellitus (HCC)    a. poorly controlled  . Hyperlipidemia   . Hypertension   . Hypokalemia    a. recurrent  . Kidney stones   . MI (myocardial infarction) (HCC) 11/2013  . Mitral regurgitation    a. echo 11/2013: EF 55-60%, mild LVH, mildly dilated LA, mild TR, mild to mod MR, mildly elevated RVSP    . Obesity   . Tobacco abuse    Past Surgical History:  Procedure Laterality Date  . CARDIAC CATHETERIZATION  12/08/2013  . CARDIOVERSION N/A 09/22/2020   Procedure: CARDIOVERSION;  Surgeon: Antonieta Iba, MD;  Location: [facility] ORS;  Service: Cardiovascular;  Laterality: N/A;  . CORONARY ANGIOPLASTY  12/08/2013   s/p stent placement  . HERNIA REPAIR    . LAMINECTOMY    . PERCUTANEOUS CORONARY STENT INTERVENTION (PCI-S) N/A 12/23/2013   Procedure: PERCUTANEOUS CORONARY STENT INTERVENTION (PCI-S);  Surgeon: [name], MD;  Location: [facility] LAB;  Service: Cardiovascular;  Laterality: N/A;  . SPINE SURGERY  2014  . TONSILLECTOMY      Allergies  Allergies  Allergen Reactions  . Spironolactone Other (See Comments)    Gynomastia  .  Penicillins Hives and Rash       . Atorvastatin Other (See Comments)    Myalgias   . Crestor [Rosuvastatin Calcium] Other (See Comments)    Myalgias   . Simvastatin Other (See Comments)    Myalgias    History of Present Illness    [name] is a 70 y.o. male with a hx of CAD s/p NSTEMI 11/2013 s/p DES to RPL2 12/09/13 and staged PCI to mid LCx and D1 12/23/2013, DM2, persistent atrial fibrillation on Eliquis, HTN, HLD, tobacco use, obesity last seen 09/29/20.  Admitted to [facility] 11/2013 for NSTEMI. Echo with LVEF 55-60%, mild LVH, mild dilated LA, mild TR, mild-moderate MR, RVSP mildly elevated. Cardiac cath with PCI/DES to distal RCA/osital PL branch and staged PCI 12/2013 at [facility] with DES to mic LCx and D1. Noted residual 40% left main stenosis and 50% ostial LCx stenosis.   Seen 10/2015 with palpitations. Echo at that time LVEF 55-60%, mild concentric LVH, gr2DD, moderate MR, moderately dilated LA, PASP . 40 hour holter SR with frequent PAC/PVC, rare short runs of narrow complex tachycardia and NSVT. Repeat 48 hour Holter 10/2015 with SR, PAC/PVC with runs of bigmeniny, short episode idioventricular rhythm, several episodes of SVT.   Seen in ED 07/2019 and diagnosed with atrial fibrillation. Placed on Eliquis and converted to SR with diltiazem. Office visit 08/17/20  with improved though still elevated BP readings and EKG SB 59bpm. Underlying sinus bradycardia had precluded escalation of Diltiazem.   Admitted 09/07/20-09/08/20 with atrial fib with RVR. Echo with LVEF 55-60%, no RWMA, mild LVH, indeterminite LV diastolic parameters, low normal RVSF with mildly enlarged RV cavity size, severely dilated LA 13mm, moderately dilated right atrium, mild MR, mild aortic valve sclerosis without stenosis. He remained in atrial fibrillation and his Diltiazem was titrated.   Seen in follow up 09/20/20 and scheduled for cardioversion. Underwent cardioversion with Dr. Mariah Milling 09/22/20. Called the office  09/26/20 nothing HR in the 40s. Recommended to reduce dose of Diltiazem from 240mg  to 120mg . Seen in follow up 09/29/20. BP was routinely elevated and Chlorthalidone and Potassium were added to regimen.   Just got back from a cruise with his wife last week. Tells me they had a wonderful time but ate a lot of high salt foods. He has not checked his BP since being home. Reports tolerating Chlorthalidone and potassium without difficulty. Prior to his trip tells me his BP at home was 140/90. Notes it is always higher in the office. Was recently diagnosed with sleep apnea and is reaching out to that provider to schedule CPAP fitting. We discussed the role this plays in blood pressure. He has not been walking on the treadmill since being back from his trip from exercise as he has been working evening shifts but planning to start a new part time job that will be more daytime hours.   Reports no shortness of breath nor dyspnea on exertion. Reports no chest pain, pressure, or tightness. No edema, orthopnea, PND. Reports no palpitations.    EKGs/Labs/Other Studies Reviewed:   The following studies were reviewed today:  2D echo 12/22/201: 1. Left ventricular ejection fraction, by estimation, is 55 to 60%. The  left ventricle has normal function. The left ventricle has no regional  wall motion abnormalities. There is mild left ventricular hypertrophy.  Left ventricular diastolic parameters  are indeterminate.   2. Right ventricular systolic function is low normal. The right  ventricular size is mildly enlarged.   3. Left atrial size was severely dilated.   4. Right atrial size was moderately dilated.   5. The mitral valve is normal in structure. Mild mitral valve  regurgitation.   6. The aortic valve is tricuspid. Aortic valve regurgitation is not  visualized. Mild aortic valve sclerosis is present, with no evidence of  aortic valve stenosis.   7. The inferior vena cava is normal in size with greater  than 50%  respiratory variability, suggesting right atrial pressure of 3 mmHg. __________   48-hour Holter 10/2015: Average heart rate 65 bpm, maximum heart rate 121 bpm Normal sinus rhythm with APCs and PVCs, including couplets, runs of bigeminy Short episodes of Idioventricular escape rhythm noted rate of 53 bpm Several episodes of episodes of supraventricular tachycardia, 6 beats __________   48-hour Holter 10/2015: Normal sinus rhythm, Frequent APCs and PVCs, Rare short runs of narrow complex tachycardia, Rare short runs of NSVT __________   2D echo 10/2015: - Left ventricle: The cavity size was mildly dilated. There was    mild concentric hypertrophy. Systolic function was normal. The    estimated ejection fraction was in the range of 55% to 60%. Wall    motion was normal; there were no regional wall motion    abnormalities. Features are consistent with a pseudonormal left    ventricular filling pattern, with concomitant abnormal relaxation  and increased filling pressure (grade 2 diastolic dysfunction).  - Mitral valve: There was moderate regurgitation.  - Left atrium: The atrium was moderately dilated.  - Pulmonary arteries: Systolic pressure was mildly increased. PA    peak pressure: 40 mm Hg (S). __________   [facility] 12/2013: PCI to mid LCx and D1 __________   [facility] 11/2013: D1 80%, mid LCx 90%, RPL2 99% s/p PCI/DES  EKG:  EKG is ordered today.  The ekg ordered today demonstrates NSR with sinus arrhythmia 62 bpm, unable to exclude prior septal infarct, no acute ST/T wave changes.Rhythm strip with occasional PVC.  Recent Labs: 09/07/2020: TSH 1.782 09/29/2020: ALT 40; BUN 27; Creatinine, Ser 0.99; Hemoglobin WILL FOLLOW; Platelets WILL FOLLOW; Potassium 4.2; Sodium 140  Recent Lipid Panel    Component Value Date/Time   CHOL 114 09/29/2020 0920   CHOL 164 07/10/2016 1525   CHOL 182 12/07/2013 0956   TRIG 102 09/29/2020 0920   TRIG 108 07/10/2016 1525   TRIG 224 (H)  12/07/2013 0956   HDL 38 (L) 09/29/2020 0920   HDL 33 (L) 12/07/2013 0956   CHOLHDL 3.0 09/29/2020 0920   VLDL 22 07/10/2016 1525   VLDL 45 (H) 12/07/2013 0956   LDLCALC 57 09/29/2020 0920   LDLCALC 104 (H) 12/07/2013 0956    Home Medications   Current Meds  Medication Sig  . buPROPion (WELLBUTRIN XL) 150 MG 24 hr tablet Take 1 tablet (150 mg total) by mouth daily.  . carvedilol (COREG) 25 MG tablet Take 25 mg by mouth 2 (two) times daily.  . chlorthalidone (HYGROTON) 25 MG tablet Take 1 tablet (25 mg total) by mouth daily.  . cholecalciferol (VITAMIN D3) 25 MCG (1000 UNIT) tablet Take 1,000 Units by mouth daily.  . Coenzyme Q10 10 MG capsule Take 10 mg by mouth daily.   Marland Kitchen diltiazem (CARDIZEM CD) 120 MG 24 hr capsule Take 1 capsule (120 mg total) by mouth daily.  Marland Kitchen diltiazem (CARDIZEM) 30 MG tablet Take 1 tablet (30 mg) by mouth three times a day as needed for heart rates > 130 bpm  . ELIQUIS 5 MG TABS tablet TAKE 1 TABLET(5 MG) BY MOUTH TWICE DAILY  . ezetimibe (ZETIA) 10 MG tablet TAKE 1 TABLET(10 MG) BY MOUTH DAILY  . Glucose Blood (BLOOD GLUCOSE TEST STRIPS) STRP 1 strip by In Vitro route daily.  Marland Kitchen losartan (COZAAR) 100 MG tablet TAKE 1 TABLET(100 MG) BY MOUTH DAILY  . magnesium oxide (MAG-OX) 400 MG tablet Take 400 mg by mouth daily.  . metFORMIN (GLUCOPHAGE) 500 MG tablet Take 500 mg by mouth daily with supper.  . Multiple Vitamin (MULTIVITAMIN) capsule Take 1 capsule by mouth daily.  . potassium chloride SA (KLOR-CON) 20 MEQ tablet Take 1 tablet (20 mEq total) by mouth daily.  . rosuvastatin (CRESTOR) 20 MG tablet TAKE 1 TABLET(20 MG) BY MOUTH DAILY  . sildenafil (REVATIO) 20 MG tablet TAKE 1-5 TABLETS BY MOUTH DAILY AS NEEDED    Review of Systems   All other systems reviewed and are otherwise negative except as noted above.  Physical Exam    VS:  BP (!) 182/100 (BP Location: Left Arm, Patient Position: Sitting, Cuff Size: Normal)   Pulse 62   Ht 5\' 5"  (1.651 m)   Wt  173 lb 8 oz (78.7 kg)   SpO2 98%   BMI 28.87 kg/m  , BMI Body mass index is 28.87 kg/m.  Wt Readings from Last 3 Encounters:  10/24/20 173 lb 8 oz (78.7  kg)  09/29/20 171 lb (77.6 kg)  09/20/20 172 lb (78 kg)    GEN: Well nourished, well developed, in no acute distress. HEENT: normal. Neck: Supple, no JVD, carotid bruits, or masses. Cardiac: bradycardia, RRR, no murmurs, rubs, or gallops. No clubbing, cyanosis, edema.  Radials/DP/PT 2+ and equal bilaterally.  Respiratory:  Respirations regular and unlabored, clear to auscultation bilaterally. GI: Soft, nontender, nondistended. MS: No deformity or atrophy. Skin: Warm and dry, no rash. Neuro:  Strength and sensation are intact. Psych: Normal affect.  Assessment & Plan    1. Persistent atrial fibrillation on chronic anticoagulation - Maintaining NSR. HR 50s-60s on Diltiazem 120mg  daily. No recurrent heart rates in 40s since Diltiazem dose reduced. Denies bleeding complications, continue Eliquis 5mg  BID due to [facility] of at least 3.   2. CAD - Stable with no anginal symptoms. GDMT includes beta-blocker, statin.  No aspirin secondary to chronic anticoagulation.Continue heart healthy diet and regular cardiovascular exercise.   3. HTN - BP remains elevated. Has not checked at home since returning from cruise and endorses eating high sodium foods. Encouraged to monitor at home and keep log. Discussed that his untreated sleep apnea, as below, is likely contributory. He prefers to continue present antihypertensive regimen and treat sleep apnea, avoid salt and reassess his BP. Continue Losartan 100mg  daily, Chlorthalidone 25mg  daily, Potassium daily, Diltiazem 120mg  daily, Coreg 25mg  BID. BMP today for monitoring. If BP remains consistently above goal despite treatment of OSA, consider transition from Losartan to Valsartan.  4. OSA - Recent diagnosis. Encouraged to follow up with that provider regarding CPAP.   5. HLD, LDL goal <70 -  09/29/20 LDL 57. Continue Crestor.   Disposition: Follow up in 3 months with Dr. or APP  Signed, , NP 10/24/2020, 2:01 PM [facility]

## 2020-10-24 NOTE — Patient Instructions (Addendum)
Medication Instructions:  Continue your current medications.   *If you need a refill on your cardiac medications before your next appointment, please call your pharmacy*  Lab Work: Your provider recommends lab work today: BMP  If you have labs (blood work) drawn today and your tests are completely normal, you will receive your results only by: Marland Kitchen MyChart Message (if you have MyChart) OR . A paper copy in the mail If you have any lab test that is abnormal or we need to change your treatment, we will call you to review the results.  Testing/Procedures: Your EKG today shows normal sinus rhythm.   Follow-Up: At [facility], you and your health needs are our priority.  As part of our continuing mission to provide you with exceptional heart care, we have created designated Provider Care Teams.  These Care Teams include your primary Cardiologist (physician) and Advanced Practice Providers (APPs -  Physician Assistants and Nurse Practitioners) who all work together to provide you with the care you need, when you need it.  We recommend signing up for the patient portal called "MyChart".  Sign up information is provided on this After Visit Summary.  MyChart is used to connect with patients for Virtual Visits (Telemedicine).  Patients are able to view lab/test results, encounter notes, upcoming appointments, etc.  Non-urgent messages can be sent to your provider as well.   To learn more about what you can do with MyChart, go to [email or web address].    Your next appointment:   3 month(s)  The format for your next appointment:   In Person  Provider:   You may see [name], MD or one of the following Advanced Practice Providers on your designated Care Team:    Nicolasa Ducking, NP  Eula Listen, PA-C  Marisue Ivan, PA-C  Cadence Fransico Michael, [place]  Gillian Shields, NP  Other Instructions  Heart Healthy Diet Recommendations: A low-salt diet is recommended. Meats should be grilled,  baked, or boiled. Avoid fried foods. Focus on lean protein sources like fish or chicken with vegetables and fruits. The American Heart Association is a Chief Technology Officer!  American Heart Association Diet and Lifeystyle Recommendations   Exercise recommendations: The American Heart Association recommends 150 minutes of moderate intensity exercise weekly. Try 30 minutes of moderate intensity exercise 4-5 times per week. This could include walking, jogging, or swimming.  Blood pressure: Our blood pressure goal is for your blood pressure to be less than 130/80. Your recent salt intake, being in a bit of a rush, and your sleep apnea likely keep your blood pressure elevated. Recommend checking your blood pressure at home and keep a log. If your blood pressure is consistently more than 130/80 we will consider switching your Losartan to a medication called Valsartan.   Valsartan Oral Tablets What is this medicine? VALSARTAN (val SAR tan) is an angiotensin II receptor blocker, also known as an ARB. It treats high blood pressure. It can also treat heart failure and heart damage after a heart attack. This medicine may be used for other purposes; ask your health care provider or pharmacist if you have questions. COMMON BRAND NAME(S): Diovan What should I tell my health care provider before I take this medicine? They need to know if you have any of these conditions:  heart failure  kidney disease  liver disease  an unusual or allergic reaction to valsartan, other medicines, foods, dyes, or preservatives  pregnant or trying to get pregnant  breast-feeding How should I use this medicine?  Take this medicine by mouth. Take it as directed on the prescription label at the same time every day. You can take it with or without food. If it upsets your stomach, take it with food. Keep taking it unless your health care provider tells you to stop. Talk to your health care provider about the use of this medicine in  children. While it may be prescribed for children as young as 1 for selected conditions, precautions do apply. Overdosage: If you think you have taken too much of this medicine contact a poison control center or emergency room at once. NOTE: This medicine is only for you. Do not share this medicine with others. What if I miss a dose? If you miss a dose, take it as soon as you can. If it is almost time for your next dose, take only that dose. Do not take double or extra doses. What may interact with this medicine?  aliskiren  ACE inhibitors, like enalapril or lisinopril  diuretics, especially amiloride, eplerenone, spironolactone, or triamterene  lithium  NSAIDs, medicines for pain and inflammation, like ibuprofen or naproxen  potassium salts or potassium supplements This list may not describe all possible interactions. Give your health care provider a list of all the medicines, herbs, non-prescription drugs, or dietary supplements you use. Also tell them if you smoke, drink alcohol, or use illegal drugs. Some items may interact with your medicine. What should I watch for while using this medicine? Visit your health care provider for regular checks on your progress. Check your blood pressure as directed. Ask your health care provider what your blood pressure should be. Also, find out when you should contact him or her. Do not treat yourself for coughs, colds, or pain while you are taking this medicine without asking your health care provider for advice. Some medicines may increase your blood pressure. Women should inform their health care provider if they wish to become pregnant or think they might be pregnant. There is a potential for serious side effects to an unborn child. Talk to your health care provider for more information. You may get drowsy or dizzy. Do not drive, use machinery, or do anything that needs mental alertness until you know how this medicine affects you. Do not stand or sit  up quickly, especially if you are an older patient. This reduces the risk of dizzy or fainting spells. Alcohol can make you more drowsy and dizzy. Avoid alcoholic drinks. Avoid salt substitutes unless you are told otherwise by your health care provider. What side effects may I notice from receiving this medicine? Side effects that you should report to your doctor or health care professional as soon as possible:  allergic reactions (skin rash, itching or hives, swelling of the hands, feet, face, lips, throat, or tongue)  breathing problems  high potassium levels (chest pain; fast, irregular heartbeat; muscle weakness)  kidney injury (trouble passing urine or change in the amount of urine)  low blood pressure (dizziness; feeling faint or lightheaded, falls; unusually weak or tired) Side effects that usually do not require medical attention (report to your doctor or health care professional if they continue or are bothersome):  cough  diarrhea  headache  nausea or stomach pain This list may not describe all possible side effects. Call your doctor for medical advice about side effects. You may report side effects to FDA at 1-800-FDA-1088. Where should I keep my medicine? Keep out of the reach of children and pets. Store at room temperature between  20 and 25 degrees C (68 and 77 degrees F). Protect from moisture. Keep the container tightly closed. Get rid of any unused medicine after the expiration date. To get rid of medicines that are no longer needed or have expired:  Take the medicine to a medicine take-back program. Check with your pharmacy or law enforcement to find a location.  If you cannot return the medicine, check the label or package insert to see if the medicine should be thrown out in the garbage or flushed down the toilet. If you are not sure, ask your health care provider. If it is safe to put it in the trash, take the medicine out of the container. Mix the medicine with cat  litter, dirt, coffee grounds, or other unwanted substance. Seal the mixture in a bag or container. Put it in the trash. NOTE: This sheet is a summary. It may not cover all possible information. If you have questions about this medicine, talk to your doctor, pharmacist, or health care provider.  2021 Elsevier/Gold Standard (2020-01-06 14:18:32)

## 2020-10-25 LAB — BASIC METABOLIC PANEL
BUN/Creatinine Ratio: 30 — ABNORMAL HIGH (ref 10–24)
BUN: 35 mg/dL — ABNORMAL HIGH (ref 8–27)
CO2: 26 mmol/L (ref 20–29)
Calcium: 9.8 mg/dL (ref 8.6–10.2)
Chloride: 99 mmol/L (ref 96–106)
Creatinine, Ser: 1.17 mg/dL (ref 0.76–1.27)
GFR calc Af Amer: 73 mL/min/{1.73_m2} (ref >59)
GFR calc non Af Amer: 63 mL/min/{1.73_m2} (ref >59)
Glucose: 137 mg/dL — ABNORMAL HIGH (ref 65–99)
Potassium: 3.9 mmol/L (ref 3.5–5.2)
Sodium: 140 mmol/L (ref 134–144)

## 2020-11-07 ENCOUNTER — Emergency Department: Payer: BC Managed Care – PPO

## 2020-11-07 ENCOUNTER — Observation Stay
Admission: EM | Admit: 2020-11-07 | Discharge: 2020-11-08 | Disposition: A | Discharge status: Home / Self Care | Payer: BC Managed Care – PPO | Attending: Internal Medicine | Admitting: Internal Medicine

## 2020-11-07 ENCOUNTER — Other Ambulatory Visit: Payer: Self-pay

## 2020-11-07 ENCOUNTER — Telehealth: Payer: Self-pay | Admitting: Cardiovascular Disease

## 2020-11-07 ENCOUNTER — Encounter: Payer: Self-pay | Admitting: Cardiology

## 2020-11-07 ENCOUNTER — Ambulatory Visit (INDEPENDENT_AMBULATORY_CARE_PROVIDER_SITE_OTHER): Payer: BC Managed Care – PPO | Admitting: Cardiology

## 2020-11-07 VITALS — BP 128/90 | HR 139 | Ht 65.0 in | Wt 172.0 lb

## 2020-11-07 DIAGNOSIS — I4891 Unspecified atrial fibrillation: Principal | ICD-10-CM | POA: Diagnosis present

## 2020-11-07 DIAGNOSIS — D6869 Other thrombophilia: Secondary | ICD-10-CM

## 2020-11-07 DIAGNOSIS — R002 Palpitations: Secondary | ICD-10-CM

## 2020-11-07 DIAGNOSIS — Z955 Presence of coronary angioplasty implant and graft: Secondary | ICD-10-CM | POA: Insufficient documentation

## 2020-11-07 DIAGNOSIS — F3341 Major depressive disorder, recurrent, in partial remission: Secondary | ICD-10-CM

## 2020-11-07 DIAGNOSIS — Z87891 Personal history of nicotine dependence: Secondary | ICD-10-CM | POA: Insufficient documentation

## 2020-11-07 DIAGNOSIS — E785 Hyperlipidemia, unspecified: Secondary | ICD-10-CM

## 2020-11-07 DIAGNOSIS — Z7984 Long term (current) use of oral hypoglycemic drugs: Secondary | ICD-10-CM | POA: Insufficient documentation

## 2020-11-07 DIAGNOSIS — I2510 Atherosclerotic heart disease of native coronary artery without angina pectoris: Secondary | ICD-10-CM | POA: Diagnosis not present

## 2020-11-07 DIAGNOSIS — I25118 Atherosclerotic heart disease of native coronary artery with other forms of angina pectoris: Secondary | ICD-10-CM

## 2020-11-07 DIAGNOSIS — Z79899 Other long term (current) drug therapy: Secondary | ICD-10-CM | POA: Insufficient documentation

## 2020-11-07 DIAGNOSIS — E119 Type 2 diabetes mellitus without complications: Secondary | ICD-10-CM | POA: Diagnosis not present

## 2020-11-07 DIAGNOSIS — Z880 Allergy status to penicillin: Secondary | ICD-10-CM | POA: Insufficient documentation

## 2020-11-07 DIAGNOSIS — N183 Chronic kidney disease, stage 3 unspecified: Secondary | ICD-10-CM | POA: Insufficient documentation

## 2020-11-07 DIAGNOSIS — Z7901 Long term (current) use of anticoagulants: Secondary | ICD-10-CM | POA: Diagnosis not present

## 2020-11-07 DIAGNOSIS — F32A Depression, unspecified: Secondary | ICD-10-CM | POA: Diagnosis not present

## 2020-11-07 DIAGNOSIS — R52 Pain, unspecified: Secondary | ICD-10-CM

## 2020-11-07 DIAGNOSIS — E876 Hypokalemia: Secondary | ICD-10-CM | POA: Diagnosis not present

## 2020-11-07 DIAGNOSIS — I10 Essential (primary) hypertension: Secondary | ICD-10-CM | POA: Diagnosis not present

## 2020-11-07 DIAGNOSIS — R531 Weakness: Secondary | ICD-10-CM

## 2020-11-07 DIAGNOSIS — G4733 Obstructive sleep apnea (adult) (pediatric): Secondary | ICD-10-CM

## 2020-11-07 DIAGNOSIS — E1122 Type 2 diabetes mellitus with diabetic chronic kidney disease: Secondary | ICD-10-CM | POA: Insufficient documentation

## 2020-11-07 DIAGNOSIS — Z20822 Contact with and (suspected) exposure to covid-19: Secondary | ICD-10-CM | POA: Diagnosis not present

## 2020-11-07 DIAGNOSIS — E1169 Type 2 diabetes mellitus with other specified complication: Secondary | ICD-10-CM

## 2020-11-07 DIAGNOSIS — I4819 Other persistent atrial fibrillation: Secondary | ICD-10-CM

## 2020-11-07 LAB — BASIC METABOLIC PANEL
Anion gap: 12 (ref 5–15)
BUN: 40 mg/dL — ABNORMAL HIGH (ref 8–23)
CO2: 24 mmol/L (ref 22–32)
Calcium: 9.4 mg/dL (ref 8.9–10.3)
Chloride: 101 mmol/L (ref 98–111)
Creatinine, Ser: 1.46 mg/dL — ABNORMAL HIGH (ref 0.61–1.24)
GFR, Estimated: 52 mL/min — ABNORMAL LOW (ref >60)
Glucose, Bld: 204 mg/dL — ABNORMAL HIGH (ref 70–99)
Potassium: 3.7 mmol/L (ref 3.5–5.1)
Sodium: 137 mmol/L (ref 135–145)

## 2020-11-07 LAB — GLUCOSE, CAPILLARY: Glucose-Capillary: 158 mg/dL — ABNORMAL HIGH (ref 70–99)

## 2020-11-07 LAB — CBC
HCT: 47.4 % (ref 39.0–52.0)
Hemoglobin: 16.1 g/dL (ref 13.0–17.0)
MCH: 29.9 pg (ref 26.0–34.0)
MCHC: 34 g/dL (ref 30.0–36.0)
MCV: 88.1 fL (ref 80.0–100.0)
Platelets: 231 10*3/uL (ref 150–400)
RBC: 5.38 MIL/uL (ref 4.22–5.81)
RDW: 12 % (ref 11.5–15.5)
WBC: 12.8 10*3/uL — ABNORMAL HIGH (ref 4.0–10.5)
nRBC: 0 % (ref 0.0–0.2)

## 2020-11-07 LAB — HEMOGLOBIN A1C
Hgb A1c MFr Bld: 7.2 % — ABNORMAL HIGH (ref 4.8–5.6)
Mean Plasma Glucose: 159.94 mg/dL

## 2020-11-07 LAB — TSH: TSH: 1.056 u[IU]/mL (ref 0.350–4.500)

## 2020-11-07 LAB — RESP PANEL BY RT-PCR (FLU A&B, COVID) ARPGX2
Influenza A by PCR: NEGATIVE
Influenza B by PCR: NEGATIVE
SARS Coronavirus 2 by RT PCR: NEGATIVE

## 2020-11-07 LAB — TROPONIN I (HIGH SENSITIVITY)
Troponin I (High Sensitivity): 20 ng/L — ABNORMAL HIGH (ref <18)
Troponin I (High Sensitivity): 16 ng/L (ref <18)

## 2020-11-07 LAB — PROTIME-INR
INR: 1.4 — ABNORMAL HIGH (ref 0.8–1.2)
Prothrombin Time: 16.4 seconds — ABNORMAL HIGH (ref 11.4–15.2)

## 2020-11-07 MED ORDER — ROSUVASTATIN CALCIUM 10 MG PO TABS
20.0000 mg | ORAL_TABLET | Freq: Every day | ORAL | Status: DC
  Administered 2020-11-07: 20 mg via ORAL
  Filled 2020-11-07: qty 2
  Filled 2020-11-07: qty 1

## 2020-11-07 MED ORDER — MULTIVITAMINS PO CAPS: 1.0000 | ORAL_CAPSULE | Freq: Every day | ORAL | Status: DC

## 2020-11-07 MED ORDER — APIXABAN 5 MG PO TABS
5.0000 mg | ORAL_TABLET | Freq: Two times a day (BID) | ORAL | Status: DC
  Administered 2020-11-07 – 2020-11-08 (×2): 5 mg via ORAL
  Filled 2020-11-07 (×2): qty 1

## 2020-11-07 MED ORDER — INSULIN ASPART 100 UNIT/ML ~~LOC~~ SOLN
0.0000 [IU] | Freq: Three times a day (TID) | SUBCUTANEOUS | Status: DC
  Filled 2020-11-07: qty 1

## 2020-11-07 MED ORDER — CARVEDILOL 25 MG PO TABS
25.0000 mg | ORAL_TABLET | Freq: Two times a day (BID) | ORAL | Status: DC
  Administered 2020-11-07 – 2020-11-08 (×2): 25 mg via ORAL
  Filled 2020-11-07 (×2): qty 1

## 2020-11-07 MED ORDER — DILTIAZEM HCL-DEXTROSE 125-5 MG/125ML-% IV SOLN (PREMIX)
5.0000 mg/h | INTRAVENOUS | Status: DC
  Administered 2020-11-07: 5 mg/h via INTRAVENOUS
  Administered 2020-11-07: 15 mg/h via INTRAVENOUS
  Filled 2020-11-07 (×2): qty 125

## 2020-11-07 MED ORDER — EZETIMIBE 10 MG PO TABS
10.0000 mg | ORAL_TABLET | Freq: Every day | ORAL | Status: DC
  Administered 2020-11-08: 10 mg via ORAL
  Filled 2020-11-07: qty 1

## 2020-11-07 MED ORDER — ONDANSETRON HCL 4 MG/2ML IJ SOLN: 4.0000 mg | Freq: Four times a day (QID) | INTRAMUSCULAR | Status: DC | PRN

## 2020-11-07 MED ORDER — SODIUM CHLORIDE 0.9 % IV BOLUS
1000.0000 mL | Freq: Once | INTRAVENOUS | Status: AC
  Administered 2020-11-07: 1000 mL via INTRAVENOUS

## 2020-11-07 MED ORDER — VITAMIN D 25 MCG (1000 UNIT) PO TABS
1000.0000 [IU] | ORAL_TABLET | Freq: Every day | ORAL | Status: DC
  Administered 2020-11-08: 1000 [IU] via ORAL
  Filled 2020-11-07: qty 1

## 2020-11-07 MED ORDER — LOSARTAN POTASSIUM 50 MG PO TABS
100.0000 mg | ORAL_TABLET | Freq: Every day | ORAL | Status: DC
Start: 2020-11-07 — End: 2020-11-08
  Administered 2020-11-08: 100 mg via ORAL
  Filled 2020-11-07 (×2): qty 2

## 2020-11-07 MED ORDER — ACETAMINOPHEN 325 MG PO TABS: 650.0000 mg | ORAL_TABLET | ORAL | Status: DC | PRN

## 2020-11-07 MED ORDER — DILTIAZEM HCL ER COATED BEADS 120 MG PO CP24
120.0000 mg | ORAL_CAPSULE | Freq: Every day | ORAL | Status: DC
  Filled 2020-11-07: qty 1

## 2020-11-07 MED ORDER — ADULT MULTIVITAMIN W/MINERALS CH
1.0000 | ORAL_TABLET | Freq: Every day | ORAL | Status: DC
  Administered 2020-11-08: 1 via ORAL
  Filled 2020-11-07: qty 1

## 2020-11-07 MED ORDER — MAGNESIUM OXIDE 400 MG PO TABS
400.0000 mg | ORAL_TABLET | Freq: Every day | ORAL | Status: DC
Start: 2020-11-07 — End: 2020-11-08
  Administered 2020-11-08: 400 mg via ORAL
  Filled 2020-11-07 (×5): qty 1

## 2020-11-07 MED ORDER — DRONEDARONE HCL 400 MG PO TABS
400.0000 mg | ORAL_TABLET | Freq: Two times a day (BID) | ORAL | Status: DC
  Filled 2020-11-07 (×4): qty 1

## 2020-11-07 MED ORDER — APIXABAN 5 MG PO TABS: 5.0000 mg | ORAL_TABLET | Freq: Two times a day (BID) | ORAL | Status: DC

## 2020-11-07 MED ORDER — BUPROPION HCL ER (XL) 150 MG PO TB24
150.0000 mg | ORAL_TABLET | Freq: Every day | ORAL | Status: DC
  Administered 2020-11-08: 150 mg via ORAL
  Filled 2020-11-07 (×2): qty 1
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
## 2020-11-07 NOTE — Telephone Encounter (Signed)
Was able to return Clinton Dorsey's call, he reports not feeling will, reports A-fib last night with reate of 140s. Pt reports he feels disoriented and light headedness, worse with movement, while talking to pt he sounds tired. Pt denies any CP at current and mild shob with movement, pt did have a cardioversion in Jan, Cardizem 120 mg, chlorthalidone 25 mg, and losartan 100 started in Jan. Based on pt's symptoms, warranted an in persion visit, [name] schedule today at 10:20 am with Dr. Azucena Cecil. Advised pt if any other symptoms aroused or feeling worse before schedule visit then seek ED. Pt verbalized understanding, reports is going to take a quick shower then try and make 10:20 appt for an EKG and evaluation. Otherwise nothing further at this time.

## 2020-11-07 NOTE — ED Notes (Signed)
No cardizem in pyxis, pharmacy called.  Darl Pikes to mix and send up asap.

## 2020-11-07 NOTE — ED Notes (Signed)
Pt taken to ED 11. Pt placed on monitor. RN John at bedside. RN Tammy informed by RN Jonny Ruiz that pt was in room.

## 2020-11-07 NOTE — Telephone Encounter (Signed)
Patient c/o Palpitations:  High priority if patient c/o lightheadedness, shortness of breath, or chest pain  1) How long have you had palpitations/irregular HR/ Afib? Are you having the symptoms now? Starting last night and continuing now  2) Are you currently experiencing lightheadedness, SOB or CP? Lightheaded/disoriented  3) Do you have a history of afib (atrial fibrillation) or irregular heart rhythm? Yes, recent cardioversion in Januray  4) Have you checked your BP or HR? (document readings if available): 140 last night  5) Are you experiencing any other symptoms? diarrhea

## 2020-11-07 NOTE — Progress Notes (Signed)
    Spoke with anesthesia and specials.  Planning for DCCV on 11/08/2020 at 1 PM with Dr. Okey Dupre.  Case request and orders have been placed.

## 2020-11-07 NOTE — Patient Instructions (Addendum)
Medication Instructions:  Your physician recommends that you continue on your current medications as directed. Please refer to the Current Medication list given to you today.  *If you need a refill on your cardiac medications before your next appointment, please call your pharmacy*  Follow-Up:  You have been referred to Electrophysiology to see Dr Lalla Brothers.    At [facility], you and your health needs are our priority.  As part of our continuing mission to provide you with exceptional heart care, we have created designated Provider Care Teams.  These Care Teams include your primary Cardiologist (physician) and Advanced Practice Providers (APPs -  Physician Assistants and Nurse Practitioners) who all work together to provide you with the care you need, when you need it.  We recommend signing up for the patient portal called "MyChart".  Sign up information is provided on this After Visit Summary.  MyChart is used to connect with patients for Virtual Visits (Telemedicine).  Patients are able to view lab/test results, encounter notes, upcoming appointments, etc.  Non-urgent messages can be sent to your provider as well.   To learn more about what you can do with MyChart, go to [email or web address].    Your next appointment:   3 week(s)  The format for your next appointment:   In Person  Provider:   You may see [name], MD or one of the following Advanced Practice Providers on your designated Care Team:    Nicolasa Ducking, NP  Eula Listen, PA-C  Marisue Ivan, PA-C  Cadence Quitman, [place]  Gillian Shields, NP

## 2020-11-07 NOTE — ED Triage Notes (Addendum)
Pt comes from [facility] with c/o weakness and Afib RVR. Pt states last night he began to feel week and felt his heart was fluttering.   Pt states he went to see his Cardiologist this am and they brought him here. Pt denies any CP or SOB.  Pt states diarrhea that started this am as well.  EKG performed and rate at 143

## 2020-11-07 NOTE — H&P (View-Only) (Signed)
   Cardiology Consultation:   Patient ID: Clinton Dorsey; [id number]; 01/02/1951   Admit date: 11/07/2020 Date of Consult: 11/07/2020  Primary Care Provider: Anderson, Marshall W, MD Primary Cardiologist: Gollan Primary Electrophysiologist:  None   Patient Profile:   Clinton Dorsey is a 70 y.o. male with a hx of CAD with NSTEMI in 11/2013 s/p PCI/DES to RPL2 on 12/08/2013 and staged PCI to mid LCx and D1 on 12/23/2013, DM2, persistent Afib on Eliquis s/p recent briefly successful DCCV in 09/2020, HTN, HLD, strong family history of CAD, tobacco use, and obesity  who is being seen today for the evaluation of Afib with RVR at the request of Dr. Bradler.  History of Present Illness:   Mr. Edmonston was admitted to [facility] 11/2013 for NSTEMI. Echo 12/08/2013 showed EF of 55-60%, mild LVH, mildly dilated LA, mild TR, mild to moderate MR, RVSP mildly elevated. He underwemt cardiac cath that showed D1 80%, LCx 90% with failed attempt to place stent, distal RCA/ostial PL branch 99% s/p PCI/DES. EF 55%. He underwent staged PCI given the above failed stent to LCx and radiation load in 12/2013 at [facility] with successful PCI/DES to both midLCx and D1. There was residual 40% left main stenosis and 50% ostial LCx stenosis. He was seen in 10/2015 with palpitations. Echo at that time showed an EF of 55-60%, mild concentric LVH, no RWMA, Gr2DD, moderate mitral regurgitation, moderately dilated left atrium, and a PASP of 40 mmHg. 48-hour Holter showed sinus rhythm, frequent PACs and PVCs, rare short runs of narrow complex tachycardia and NSVT. Repeat 48-hour Holter in 10/2015 showed sinus rhythm, PACs/PVCs with runs of bigeminy, short episode of idioventricular rhythm, and several episodes of SVT lasting up to 6 beats. He was seen in the ED in 07/2019 for palpitations and told he was in Afib. At that time he was placed on Eliquis and converted to sinus rhythm with diltiazem.  He was admitted in 08/2020 with palpitations and  chest pressure with an irregular heart beat noted by his watch. He was noted to be in Afib with RVR with ventricular rates in the 120s bpm.  HS-Tn negative x 2. Echo during the admission showed an EF of 55-60%, no RWMA, mild LVH, indeterminate LV diastolic function parameters, low normal RVSF with a mildly enlarged RV cavity size, severely dilated left atrium measuring 53 mm, moderately dilated right atrium, mild mitral regurgitation, mild aortic valve sclerosis without evidence of stenosis, and an estimated right atrial pressure of 3 mmHg. He remained in Afib during the admission with controlled ventricular response following titration of Cardizem.  He underwent successful DCCV on 09/22/2020. Following this, he did go on a cruise and ate high salt foods. In this setting, he was placed on a thiazide diuretic.   He was seen in the office earlier today with recurrent Afib with RVR with ventricular rates in the 130s bpm. He was noted to not have missed any doses of Eliquis. He was sent to the ED with recommendation to proceed with DCCV on 11/08/2020. In the ED, his vitals have remained stable. He indicated he had 4, twelve oz cups of coffee yesterday, which he typically does not have. Initial HS-Tn 20 with delta troponin pending. Potassium 3.7. Recent TSH normal. He remains in Afib with RVR with ventricular rates in the 1-teens to 130s bpm.  Past Medical History:  Diagnosis Date  . Arrhythmia   . Coronary artery disease    a. NSTEMI; b. 12/23/13 cath 11/2013: D1 80%,   LCx 90% failed PCI, dRCA 99%, PL branch 99% s/p PCI/DES,  EF 55%; c. staged cardiac cath 12/2013: s/p PCI/DES to both D1 and mLCx, resdial LM 40%, ostial LCx 50%  . Depression   . Depression   . Diabetes mellitus (HCC)    a. poorly controlled  . Hyperlipidemia   . Hypertension   . Hypokalemia    a. recurrent  . Kidney stones   . MI (myocardial infarction) (HCC) 11/2013  . Mitral regurgitation    a. echo 11/2013: EF 55-60%, mild LVH, mildly  dilated LA, mild TR, mild to mod MR, mildly elevated RVSP    . Obesity   . Tobacco abuse     Past Surgical History:  Procedure Laterality Date  . CARDIAC CATHETERIZATION  12/08/2013  . CARDIOVERSION N/A 09/22/2020   Procedure: CARDIOVERSION;  Surgeon: Antonieta Iba, MD;  Location: [facility] ORS;  Service: Cardiovascular;  Laterality: N/A;  . CORONARY ANGIOPLASTY  12/08/2013   s/p stent placement  . HERNIA REPAIR    . LAMINECTOMY    . PERCUTANEOUS CORONARY STENT INTERVENTION (PCI-S) N/A 12/23/2013   Procedure: PERCUTANEOUS CORONARY STENT INTERVENTION (PCI-S);  Surgeon: [name], MD;  Location: [facility] C[facility] LAB;  Service: Cardiovascular;  Laterality: N/A;  . SPINE SURGERY  2014  . TONSILLECTOMY       Home Meds: Prior to Admission medications   Medication Sig Start Date End Date Taking? Authorizing Provider  buPROPion (WELLBUTRIN XL) 150 MG 24 hr tablet Take 1 tablet (150 mg total) by mouth daily. 12/14/19   Particia Nearing, PA-C  carvedilol (COREG) 25 MG tablet Take 25 mg by mouth 2 (two) times daily. 05/26/20   [provider]  chlorthalidone (HYGROTON) 25 MG tablet Take 1 tablet (25 mg total) by mouth daily. 09/29/20 12/28/20  Alver Sorrow, NP  cholecalciferol (VITAMIN D3) 25 MCG (1000 UNIT) tablet Take 1,000 Units by mouth daily.    [provider]  Coenzyme Q10 10 MG capsule Take 10 mg by mouth daily.     [provider]  diltiazem (CARDIZEM CD) 120 MG 24 hr capsule Take 1 capsule (120 mg total) by mouth daily. 09/29/20 12/28/20  Alver Sorrow, NP  diltiazem (CARDIZEM) 30 MG tablet Take 1 tablet (30 mg) by mouth three times a day as needed for heart rates > 130 bpm 09/20/20   Stephen Baruch M, PA-C  ELIQUIS 5 MG TABS tablet TAKE 1 TABLET(5 MG) BY MOUTH TWICE DAILY 06/03/20   Antonieta Iba, MD  ezetimibe (ZETIA) 10 MG tablet TAKE 1 TABLET(10 MG) BY MOUTH DAILY 09/30/20   Antonieta Iba, MD  Glucose Blood (BLOOD GLUCOSE TEST STRIPS) STRP 1 strip by In  Vitro route daily. 11/24/18   Steele Sizer, MD  losartan (COZAAR) 100 MG tablet TAKE 1 TABLET(100 MG) BY MOUTH DAILY 09/30/20   Antonieta Iba, MD  magnesium oxide (MAG-OX) 400 MG tablet Take 400 mg by mouth daily.    [provider]  metFORMIN (GLUCOPHAGE) 500 MG tablet Take 500 mg by mouth daily with supper.    [provider]  Multiple Vitamin (MULTIVITAMIN) capsule Take 1 capsule by mouth daily.    [provider]  potassium chloride SA (KLOR-CON) 20 MEQ tablet Take 1 tablet (20 mEq total) by mouth daily. 09/29/20   Alver Sorrow, NP  rosuvastatin (CRESTOR) 20 MG tablet TAKE 1 TABLET(20 MG) BY MOUTH DAILY 10/05/20   Antonieta Iba, MD  sildenafil (REVATIO) 20 MG  tablet TAKE 1-5 TABLETS BY MOUTH DAILY AS NEEDED 12/15/19   Antonieta Iba, MD    Inpatient Medications: Scheduled Meds:  Continuous Infusions: . diltiazem (CARDIZEM) infusion 7.5 mg/hr (11/07/20 1318)   PRN Meds:   Allergies:   Allergies  Allergen Reactions  . Spironolactone Other (See Comments)    Gynomastia  . Penicillins Hives and Rash       . Atorvastatin Other (See Comments)    Myalgias   . Crestor [Rosuvastatin Calcium] Other (See Comments)    Myalgias   . Simvastatin Other (See Comments)    Myalgias    Social History:   Social History   Socioeconomic History  . Marital status: Married    Spouse name: Not on file  . Number of children: Not on file  . Years of education: Not on file  . Highest education level: Not on file  Occupational History  . Not on file  Tobacco Use  . Smoking status: Former Smoker    Types: Cigars    Quit date: 12/08/2013    Years since quitting: 6.9  . Smokeless tobacco: Never Used  Vaping Use  . Vaping Use: Not on file  Substance and Sexual Activity  . Alcohol use: No    Alcohol/week: 1.0 standard drink    Types: 1 Glasses of wine per week    Comment: occasional  . Drug use: No  . Sexual activity: Not on file  Other Topics  Concern  . Not on file  Social History Narrative  . Not on file   Social Determinants of Health   Financial Resource Strain: Not on file  Food Insecurity: Not on file  Transportation Needs: Not on file  Physical Activity: Not on file  Stress: Not on file  Social Connections: Not on file  Intimate Partner Violence: Not on file     Family History:   Family History  Problem Relation Age of Onset  . Heart attack Father 34       MI  . Heart disease Father        CABG  . Hypertension Mother   . Hypertension Sister     ROS:  Review of Systems  Constitutional: Positive for malaise/fatigue. Negative for chills, diaphoresis, fever and weight loss.  HENT: Negative for congestion.   Eyes: Negative for discharge and redness.  Respiratory: Negative for cough, sputum production, shortness of breath and wheezing.   Cardiovascular: Positive for palpitations. Negative for chest pain, orthopnea, claudication, leg swelling and PND.  Gastrointestinal: Negative for abdominal pain, blood in stool, heartburn, melena, nausea and vomiting.  Musculoskeletal: Negative for falls and myalgias.  Skin: Negative for rash.  Neurological: Negative for dizziness, tingling, tremors, sensory change, speech change, focal weakness, loss of consciousness and weakness.  Endo/Heme/Allergies: Does not bruise/bleed easily.  Psychiatric/Behavioral: Negative for substance abuse. The patient is not nervous/anxious.   All other systems reviewed and are negative.     Physical Exam/Data:   Vitals:   11/07/20 1120 11/07/20 1141 11/07/20 1145 11/07/20 1300  BP:  (!) 117/96 (!) 117/96 (!) 137/99  Pulse:  (!) 152 (!) 154 (!) 139  Resp:  18 (!) 27 (!) 32  Temp:  98.2 F (36.8 C)    TempSrc:  Oral    SpO2:  96% 96% 92%  Weight: 78 kg     Height: 5\' 6"  (1.676 m)      No intake or output data in the 24 hours ending 11/07/20 1322 11/09/20  11/07/20 1120  Weight: 78 kg   Body mass index is 27.76 kg/m.    Physical Exam: General: Well developed, well nourished, in no acute distress. Head: Normocephalic, atraumatic, sclera non-icteric, no xanthomas, nares without discharge.  Neck: Negative for carotid bruits. JVD not elevated. Lungs: Clear bilaterally to auscultation without wheezes, rales, or rhonchi. Breathing is unlabored. Heart: Tachycardic, IRIR with S1 S2. No murmurs, rubs, or gallops appreciated. Abdomen: Soft, non-tender, non-distended with normoactive bowel sounds. No hepatomegaly. No rebound/guarding. No obvious abdominal masses. Msk:  Strength and tone appear normal for age. Extremities: No clubbing or cyanosis. No edema. Distal pedal pulses are 2+ and equal bilaterally. Neuro: Alert and oriented X 3. No facial asymmetry. No focal deficit. Moves all extremities spontaneously. Psych:  Responds to questions appropriately with a normal affect.   EKG:  The EKG was personally reviewed and demonstrates: Afib with RVR, 139 bpm, rare PVC, poor R wave progression along the precordial leads, nonspecific st/t changes  Telemetry:  Telemetry was personally reviewed and demonstrates: Afib with RVR with ventricular rates in the 1-teens to 130s bpm  Weights: Filed Weights   11/07/20 1120  Weight: 78 kg    Relevant CV Studies:  2D echo 12/22/201: 1. Left ventricular ejection fraction, by estimation, is 55 to 60%. The  left ventricle has normal function. The left ventricle has no regional  wall motion abnormalities. There is mild left ventricular hypertrophy.  Left ventricular diastolic parameters  are indeterminate.  2. Right ventricular systolic function is low normal. The right  ventricular size is mildly enlarged.  3. Left atrial size was severely dilated.  4. Right atrial size was moderately dilated.  5. The mitral valve is normal in structure. Mild mitral valve  regurgitation.  6. The aortic valve is tricuspid. Aortic valve regurgitation is not  visualized. Mild aortic valve  sclerosis is present, with no evidence of  aortic valve stenosis.  7. The inferior vena cava is normal in size with greater than 50%  respiratory variability, suggesting right atrial pressure of 3 mmHg. __________  48-hour Holter 10/2015: Average heart rate 65 bpm, maximum heart rate 121 bpm Normal sinus rhythm with APCs and PVCs, including couplets, runs of bigeminy Short episodes of Idioventricular escape rhythm noted rate of 53 bpm Several episodes of episodes of supraventricular tachycardia, 6 beats __________  48-hour Holter 10/2015: Normal sinus rhythm, Frequent APCs and PVCs, Rare short runs of narrow complex tachycardia, Rare short runs of NSVT __________  2D echo 10/2015: - Left ventricle: The cavity size was mildly dilated. There was  mild concentric hypertrophy. Systolic function was normal. The  estimated ejection fraction was in the range of 55% to 60%. Wall  motion was normal; there were no regional wall motion  abnormalities. Features are consistent with a pseudonormal left  ventricular filling pattern, with concomitant abnormal relaxation  and increased filling pressure (grade 2 diastolic dysfunction).  - Mitral valve: There was moderate regurgitation.  - Left atrium: The atrium was moderately dilated.  - Pulmonary arteries: Systolic pressure was mildly increased. PA  peak pressure: 40 mm Hg (S). __________  [facility] 12/2013: PCI to mid LCx and D1 __________  [facility] 11/2013: D1 80%, mid LCx 90%, RPL2 99% s/p PCI/DES   Laboratory Data:  Chemistry Recent Labs  Lab 11/07/20 1131  NA 137  K 3.7  CL 101  CO2 24  GLUCOSE 204*  BUN 40*  CREATININE 1.46*  CALCIUM 9.4  GFRNONAA 52*  ANIONGAP 12    No  results for input(s): PROT, ALBUMIN, AST, ALT, ALKPHOS, BILITOT in the last 168 hours. Hematology Recent Labs  Lab 11/07/20 1131  WBC 12.8*  RBC 5.38  HGB 16.1  HCT 47.4  MCV 88.1  MCH 29.9  MCHC 34.0  RDW 12.0  PLT 231   Cardiac  EnzymesNo results for input(s): TROPONINI in the last 168 hours. No results for input(s): TROPIPOC in the last 168 hours.  BNPNo results for input(s): BNP, PROBNP in the last 168 hours.  DDimer No results for input(s): DDIMER in the last 168 hours.  Radiology/Studies:  DG Chest 1 View  Result Date: 11/07/2020 IMPRESSION: No acute cardiopulmonary process. Electronically Signed   By: Acquanetta Belling M.D.   On: 11/07/2020 11:50    Assessment and Plan:   1. Persistent Afib with RVR: -Status post recent briefly successful DCCV on 09/22/2020 with recurrent Afib noted in the office earlier today with Dr. Azucena Cecil. In this setting, he was sent to the ED with recommendation for repeat DCCV on 11/08/2020. He does not want to be placed on amiodarone after reading about the side effects and class 1c medications are contraindicated due to his history of CAD -Decrease caffeine intake   -NPO at midnight -Eliquis 5 mg bid -Continue Cardizem gtt and PTA Coreg for rate control  -In sinus rhythm, he has a mildly bradycardic heart rate, therefore will defer transition to metoprolol at this time in an effort to minimize significant bradycardia post DCCV -Would hold PTA losartan in an effort to maximize BP room for rate control medications as needed -Schedule DCCV 11/08/2020 -Recommend EP follow up as an outpatient for consideration of Afib ablation vs alternative AAT given he is quite symptomatic with his Afib  2. CAD involving the native coronary arteries without angina: -Initial HS-Tn 20 with delta troponin pending and is likely elevated in the setting of supply demand ischemia in the context of Afib with RVR and underlying CAD -Eliquis in place of ASA -Coreg  3. HTN: -Blood pressure reasonably controlled -Continue current medical therapy as outlined above  4. HLD: -LDL 57 from 08/2020 -Crestor and Zetia -Outpatient follow up   For questions or updates, please contact [facility] Please consult  [email or web address] for contact info under Cardiology/STEMI.   Signed, Eula Listen, PA-C [facility] Pager: [phone] 11/07/2020, 1:22 PM

## 2020-11-07 NOTE — ED Triage Notes (Signed)
First Nurse Note:  ARrives from heart care for ED evaluation of new onset Afib RVR.  12-lead done PTA, shows HR:  139.  Patient is AAOx3.  Skin warm and dry. NAD

## 2020-11-07 NOTE — H&P (Signed)
History and Physical    Clinton Dorsey EUM:[id number] DOB: [birth year] DOA: 11/07/2020  PCP: [name], MD   Patient coming from: Home  I have personally briefly reviewed patient's old medical records in [facility]  Chief Complaint: Weakness/palpitations  HPI: Clinton Dorsey is a 70 y.o. male with medical history significant for coronary artery disease status post stent angioplasty, paroxysmal A. fib status post DC cardioversion on 09/22/20, depression and diabetes mellitus who presents to the emergency room for evaluation of dizziness, palpitations and weakness.  Patient states his symptoms started 1 day prior to his admission when he started feeling very dizzy and weak.  He looked at his smart watch which revealed A. fib with heart rate of about 140 bpm.  His symptoms have continued despite him taking his medications (diltiazem and carvedilol) and so he presented to the emergency room for evaluation.  He also had episodes of diarrhea.  He admits to caffeine ingestion. He denies having any chest pain, no shortness of breath, no nausea, no vomiting, no diaphoresis, no abdominal pain, no constipation, no urinary frequency, no nocturia, no dysuria, no fever, no chills, no cough. Labs show sodium 137, potassium 3.7, chloride 101, bicarb 24, glucose 204, BUN 40, creatinine 1.46, calcium 9.4, troponin XX > 16, white count 12.8, hemoglobin 16.1, hematocrit 47.4, MCV 88.1, RDW 12, platelet count 231 Respiratory viral panel is negative Chest x-ray reviewed by me shows no acute cardiopulmonary disease Twelve-lead EKG reviewed by me shows atrial fibrillation with a rapid ventricular rate.  Incomplete right bundle branch block    ED Course: Patient is a 70 year old Caucasian male who presents to the ER for evaluation of dizziness, palpitations and weakness and is found to be in rapid A. fib.  Patient is status post recent DC cardioversion about 4 weeks ago.  He is currently on a Cardizem  drip and will be admitted to the hospital for further evaluation.  Review of Systems: As per HPI otherwise all other systems on review of systems negative.    Past Medical History:  Diagnosis Date  . Arrhythmia   . Coronary artery disease    a. NSTEMI; b. 12/23/13 cath 11/2013: D1 80%, LCx 90% failed PCI, dRCA 99%, PL branch 99% s/p PCI/DES,  EF 55%; c. staged cardiac cath 12/2013: s/p PCI/DES to both D1 and mLCx, resdial LM 40%, ostial LCx 50%  . Depression   . Depression   . Diabetes mellitus (HCC)    a. poorly controlled  . Hyperlipidemia   . Hypertension   . Hypokalemia    a. recurrent  . Kidney stones   . MI (myocardial infarction) (HCC) 11/2013  . Mitral regurgitation    a. echo 11/2013: EF 55-60%, mild LVH, mildly dilated LA, mild TR, mild to mod MR, mildly elevated RVSP    . Obesity   . Tobacco abuse     Past Surgical History:  Procedure Laterality Date  . CARDIAC CATHETERIZATION  12/08/2013  . CARDIOVERSION N/A 09/22/2020   Procedure: CARDIOVERSION;  Surgeon: Antonieta Iba, MD;  Location: [facility] ORS;  Service: Cardiovascular;  Laterality: N/A;  . CORONARY ANGIOPLASTY  12/08/2013   s/p stent placement  . HERNIA REPAIR    . LAMINECTOMY    . PERCUTANEOUS CORONARY STENT INTERVENTION (PCI-S) N/A 12/23/2013   Procedure: PERCUTANEOUS CORONARY STENT INTERVENTION (PCI-S);  Surgeon: [name], MD;  Location: [facility] CATH LAB;  Service: Cardiovascular;  Laterality: N/A;  . SPINE SURGERY  2014  . TONSILLECTOMY  reports that he quit smoking about 6 years ago. His smoking use included cigars. He has never used smokeless tobacco. He reports that he does not drink alcohol and does not use drugs.  Allergies  Allergen Reactions  . Spironolactone Other (See Comments)    Gynomastia  . Penicillins Hives and Rash       . Atorvastatin Other (See Comments)    Myalgias   . Crestor [Rosuvastatin Calcium] Other (See Comments)    Myalgias   . Simvastatin Other (See Comments)     Myalgias    Family History  Problem Relation Age of Onset  . Heart attack Father 57       MI  . Heart disease Father        CABG  . Hypertension Mother   . Hypertension Sister       Prior to Admission medications   Medication Sig Start Date End Date Taking? Authorizing Provider  buPROPion (WELLBUTRIN XL) 150 MG 24 hr tablet Take 1 tablet (150 mg total) by mouth daily. 12/14/19   Particia Nearing, PA-C  carvedilol (COREG) 25 MG tablet Take 25 mg by mouth 2 (two) times daily. 05/26/20   [provider]  chlorthalidone (HYGROTON) 25 MG tablet Take 1 tablet (25 mg total) by mouth daily. 09/29/20 12/28/20  Alver Sorrow, NP  cholecalciferol (VITAMIN D3) 25 MCG (1000 UNIT) tablet Take 1,000 Units by mouth daily.    [provider]  Coenzyme Q10 10 MG capsule Take 10 mg by mouth daily.     [provider]  diltiazem (CARDIZEM CD) 120 MG 24 hr capsule Take 1 capsule (120 mg total) by mouth daily. 09/29/20 12/28/20  Alver Sorrow, NP  diltiazem (CARDIZEM) 30 MG tablet Take 1 tablet (30 mg) by mouth three times a day as needed for heart rates > 130 bpm 09/20/20   Dunn, Ryan M, PA-C  ELIQUIS 5 MG TABS tablet TAKE 1 TABLET(5 MG) BY MOUTH TWICE DAILY 06/03/20   Antonieta Iba, MD  ezetimibe (ZETIA) 10 MG tablet TAKE 1 TABLET(10 MG) BY MOUTH DAILY 09/30/20   Antonieta Iba, MD  Glucose Blood (BLOOD GLUCOSE TEST STRIPS) STRP 1 strip by In Vitro route daily. 11/24/18   Steele Sizer, MD  losartan (COZAAR) 100 MG tablet TAKE 1 TABLET(100 MG) BY MOUTH DAILY 09/30/20   Antonieta Iba, MD  magnesium oxide (MAG-OX) 400 MG tablet Take 400 mg by mouth daily.    [provider]  metFORMIN (GLUCOPHAGE) 500 MG tablet Take 500 mg by mouth daily with supper.    [provider]  Multiple Vitamin (MULTIVITAMIN) capsule Take 1 capsule by mouth daily.    [provider]  potassium chloride SA (KLOR-CON) 20 MEQ tablet Take 1 tablet (20 mEq total) by  mouth daily. 09/29/20   Alver Sorrow, NP  rosuvastatin (CRESTOR) 20 MG tablet TAKE 1 TABLET(20 MG) BY MOUTH DAILY 10/05/20   Antonieta Iba, MD  sildenafil (REVATIO) 20 MG tablet TAKE 1-5 TABLETS BY MOUTH DAILY AS NEEDED 12/15/19   Antonieta Iba, MD    Physical Exam: Vitals:   11/07/20 1120 11/07/20 1141 11/07/20 1145 11/07/20 1300  BP:  (!) 117/96 (!) 117/96 (!) 137/99  Pulse:  (!) 152 (!) 154 (!) 139  Resp:  18 (!) 27 (!) 32  Temp:  98.2 F (36.8 C)    TempSrc:  Oral    SpO2:  96% 96% 92%  Weight: 78 kg  Height: 5\' 6"  (1.676 m)        Vitals:   11/07/20 1120 11/07/20 1141 11/07/20 1145 11/07/20 1300  BP:  (!) 117/96 (!) 117/96 (!) 137/99  Pulse:  (!) 152 (!) 154 (!) 139  Resp:  18 (!) 27 (!) 32  Temp:  98.2 F (36.8 C)    TempSrc:  Oral    SpO2:  96% 96% 92%  Weight: 78 kg     Height: 5\' 6"  (1.676 m)         Constitutional: Alert and oriented x 3 . Not in any apparent distress HEENT:      Head: Normocephalic and atraumatic.         Eyes: PERLA, EOMI, Conjunctivae are normal. Sclera is non-icteric.       Mouth/Throat: Mucous membranes are moist.       Neck: Supple with no signs of meningismus. Cardiovascular:  Irregularly irregular, Tachycardic. No murmurs, gallops, or rubs. 2+ symmetrical distal pulses are present . No JVD. No LE edema Respiratory: Respiratory effort normal .Lungs sounds clear bilaterally. No wheezes, crackles, or rhonchi.  Gastrointestinal: Soft, non tender, and non distended with positive bowel sounds.  Genitourinary: No CVA tenderness. Musculoskeletal: Nontender with normal range of motion in all extremities. No cyanosis, or erythema of extremities. Neurologic:  Face is symmetric. Moving all extremities. No gross focal neurologic deficits  Skin: Skin is warm, dry.  No rash or ulcers Psychiatric: Mood and affect are normal   Labs on Admission: I have personally reviewed following labs and imaging studies  CBC: Recent Labs  Lab  11/07/20 1131  WBC 12.8*  HGB 16.1  HCT 47.4  MCV 88.1  PLT 231   Basic Metabolic Panel: Recent Labs  Lab 11/07/20 1131  NA 137  K 3.7  CL 101  CO2 24  GLUCOSE 204*  BUN 40*  CREATININE 1.46*  CALCIUM 9.4   GFR: Estimated Creatinine Clearance: 46.9 mL/min (A) (by C-G formula based on SCr of 1.46 mg/dL (H)). Liver Function Tests: No results for input(s): AST, ALT, ALKPHOS, BILITOT, PROT, ALBUMIN in the last 168 hours. No results for input(s): LIPASE, AMYLASE in the last 168 hours. No results for input(s): AMMONIA in the last 168 hours. Coagulation Profile: No results for input(s): INR, PROTIME in the last 168 hours. Cardiac Enzymes: No results for input(s): CKTOTAL, CKMB, CKMBINDEX, TROPONINI in the last 168 hours. BNP (last 3 results) No results for input(s): PROBNP in the last 8760 hours. HbA1C: No results for input(s): HGBA1C in the last 72 hours. CBG: No results for input(s): GLUCAP in the last 168 hours. Lipid Profile: No results for input(s): CHOL, HDL, LDLCALC, TRIG, CHOLHDL, LDLDIRECT in the last 72 hours. Thyroid Function Tests: No results for input(s): TSH, T4TOTAL, FREET4, T3FREE, THYROIDAB in the last 72 hours. Anemia Panel: No results for input(s): VITAMINB12, FOLATE, FERRITIN, TIBC, IRON, RETICCTPCT in the last 72 hours. Urine analysis:    Component Value Date/Time   APPEARANCEUR Clear 11/24/2018 1414   GLUCOSEU Negative 11/24/2018 1414   BILIRUBINUR Negative 11/24/2018 1414   PROTEINUR 1+ (A) 11/24/2018 1414   NITRITE Negative 11/24/2018 1414   LEUKOCYTESUR Negative 11/24/2018 1414    Radiological Exams on Admission: DG Chest 1 View  Result Date: 11/07/2020 CLINICAL DATA:  Weakness Atrial fibrillation with RVR EXAM: CHEST  1 VIEW COMPARISON:  07/29/2020 FINDINGS: The heart size and mediastinal contours are within normal limits. Both lungs are clear. The visualized skeletal structures are unremarkable. IMPRESSION: No acute cardiopulmonary process.  Electronically Signed   By: Acquanetta Belling M.D.   On: 11/07/2020 11:50     Assessment/Plan Principal Problem:   Atrial fibrillation with RVR (HCC) Active Problems:   Hypokalemia   CAD (coronary artery disease)   Diabetes mellitus (HCC)   Depression       Atrial fibrillation with rapid ventricular rate Patient has a known history of atrial fibrillation is post recent DC cardioversion who presents for evaluation of palpitations and weakness Continue Cardizem drip initiated in the ER Resume oral Cardizem and carvedilol to optimize rate control Continue Eliquis as primary prophylaxis for an acute stroke Consult cardiology    Hypertension Continue Cozaar, carvedilol and diltiazem    Coronary artery disease Continue carvedilol and statins Cycle cardiac enzymes to rule out acute coronary syndrome    Depression Continue Wellbutrin    Diabetes mellitus Diet controlled Place patient on consistent carbohydrate diet Glycemic control with sliding scale insulin       DVT prophylaxis: Apixaban Code Status: full code Family Communication: Greater than 50% of time was spent discussing patient's condition and plan of care with him and his wife at the bedside.  All questions and concerns have been addressed.  He verbalizes understanding and agrees with the plan. Disposition Plan: Back to previous home environment Consults called: Cardiology Status: Inpatient.  The medical decision making for this patient was of high complexity and patient is at high risk for clinical deterioration during this hospitalization.    Lucile Shutters MD [facility]     11/07/2020, 2:28 PM

## 2020-11-07 NOTE — ED Notes (Signed)
Pt refused any home meds until wife comes back to room.

## 2020-11-07 NOTE — Consult Note (Signed)
Cardiology Consultation:   Patient ID: Clinton Dorsey; [id number]; [birth year]   Admit date: 11/07/2020 Date of Consult: 11/07/2020  Primary Care Provider: Lauro RegulusAnderson, Marshall W, MD Primary Cardiologist: Mariah MillingGollan Primary Electrophysiologist:  None   Patient Profile:   Clinton Dorsey is a 70 y.o. male with a hx of CAD with NSTEMI in 11/2013 s/p PCI/DES to RPL2 on 12/08/2013 and staged PCI to mid LCx and D1 on 12/23/2013, DM2, persistent Afib on Eliquis s/p recent briefly successful DCCV in 09/2020, HTN, HLD, strong family history of CAD, tobacco use, and obesity  who is being seen today for the evaluation of Afib with RVR at the request of Dr. Vicente MalesBradler.  History of Present Illness:   Clinton Dorsey was [facility] for NSTEMI. Echo 12/08/2013 showed EF of 55-60%, mild LVH, mildly dilated LA, mild TR, mild to moderate MR, RVSP mildly elevated. He underwemt cardiac cath that showed D1 80%, LCx 90% with failed attempt to place stent, distal RCA/ostial PL branch 99% s/p PCI/DES. EF 55%. He underwent staged PCI given the above failed stent to LCx and radiation load in 12/2013 at [facility] with successful PCI/DES to both midLCx and D1. There was residual 40% left main stenosis and 50% ostial LCx stenosis. He was seen in 10/2015 with palpitations. Echo at that time showed an EF of 55-60%, mild concentric LVH, no RWMA, Gr2DD, moderate mitral regurgitation, moderately dilated left atrium, and a PASP of 40 mmHg. 48-hour Holter showed sinus rhythm, frequent PACs and PVCs, rare short runs of narrow complex tachycardia and NSVT. Repeat 48-hour Holter in 10/2015 showed sinus rhythm, PACs/PVCs with runs of bigeminy, short episode of idioventricular rhythm, and several episodes of SVT lasting up to 6 beats. He was seen in the ED in 07/2019 for palpitations and told he was in Afib. At that time he was placed on Eliquis and converted to sinus rhythm with diltiazem.  He was admitted in 08/2020 with palpitations and  chest pressure with an irregular heart beat noted by his watch. He was noted to be in Afib with RVR with ventricular rates in the 120s bpm.  HS-Tn negative x 2. Echo during the admission showed an EF of 55-60%, no RWMA, mild LVH, indeterminate LV diastolic function parameters, low normal RVSF with a mildly enlarged RV cavity size, severely dilated left atrium measuring 53 mm, moderately dilated right atrium, mild mitral regurgitation, mild aortic valve sclerosis without evidence of stenosis, and an estimated right atrial pressure of 3 mmHg. He remained in Afib during the admission with controlled ventricular response following titration of Cardizem.  He underwent successful DCCV on 09/22/2020. Following this, he did go on a cruise and ate high salt foods. In this setting, he was placed on a thiazide diuretic.   He was seen in the office earlier today with recurrent Afib with RVR with ventricular rates in the 130s bpm. He was noted to not have missed any doses of Eliquis. He was sent to the ED with recommendation to proceed with DCCV on 11/08/2020. In the ED, his vitals have remained stable. He indicated he had 4, twelve oz cups of coffee yesterday, which he typically does not have. Initial HS-Tn 20 with delta troponin pending. Potassium 3.7. Recent TSH normal. He remains in Afib with RVR with ventricular rates in the 1-teens to 130s bpm.  Past Medical History:  Diagnosis Date  . Arrhythmia   . Coronary artery disease    a. NSTEMI; b. 12/23/13 cath 11/2013: D1 80%,  LCx 90% failed PCI, dRCA 99%, PL branch 99% s/p PCI/DES,  EF 55%; c. staged cardiac cath 12/2013: s/p PCI/DES to both D1 and mLCx, resdial LM 40%, ostial LCx 50%  . Depression   . Depression   . Diabetes mellitus (HCC)    a. poorly controlled  . Hyperlipidemia   . Hypertension   . Hypokalemia    a. recurrent  . Kidney stones   . MI (myocardial infarction) (HCC) 11/2013  . Mitral regurgitation    a. echo 11/2013: EF 55-60%, mild LVH, mildly  dilated LA, mild TR, mild to mod MR, mildly elevated RVSP    . Obesity   . Tobacco abuse     Past Surgical History:  Procedure Laterality Date  . CARDIAC CATHETERIZATION  12/08/2013  . CARDIOVERSION N/A 09/22/2020   Procedure: CARDIOVERSION;  Surgeon: Antonieta Iba, MD;  Location: [facility] ORS;  Service: Cardiovascular;  Laterality: N/A;  . CORONARY ANGIOPLASTY  12/08/2013   s/p stent placement  . HERNIA REPAIR    . LAMINECTOMY    . PERCUTANEOUS CORONARY STENT INTERVENTION (PCI-S) N/A 12/23/2013   Procedure: PERCUTANEOUS CORONARY STENT INTERVENTION (PCI-S);  Surgeon: [name], MD;  Location: [facility] CATH LAB;  Service: Cardiovascular;  Laterality: N/A;  . SPINE SURGERY  2014  . TONSILLECTOMY       Home Meds: Prior to Admission medications   Medication Sig Start Date End Date Taking? Authorizing Provider  buPROPion (WELLBUTRIN XL) 150 MG 24 hr tablet Take 1 tablet (150 mg total) by mouth daily. 12/14/19   Particia Nearing, PA-C  carvedilol (COREG) 25 MG tablet Take 25 mg by mouth 2 (two) times daily. 05/26/20   [provider]  chlorthalidone (HYGROTON) 25 MG tablet Take 1 tablet (25 mg total) by mouth daily. 09/29/20 12/28/20  Alver Sorrow, NP  cholecalciferol (VITAMIN D3) 25 MCG (1000 UNIT) tablet Take 1,000 Units by mouth daily.    [provider]  Coenzyme Q10 10 MG capsule Take 10 mg by mouth daily.     [provider]  diltiazem (CARDIZEM CD) 120 MG 24 hr capsule Take 1 capsule (120 mg total) by mouth daily. 09/29/20 12/28/20  Alver Sorrow, NP  diltiazem (CARDIZEM) 30 MG tablet Take 1 tablet (30 mg) by mouth three times a day as needed for heart rates > 130 bpm 09/20/20   Alida Greiner M, PA-C  ELIQUIS 5 MG TABS tablet TAKE 1 TABLET(5 MG) BY MOUTH TWICE DAILY 06/03/20   Antonieta Iba, MD  ezetimibe (ZETIA) 10 MG tablet TAKE 1 TABLET(10 MG) BY MOUTH DAILY 09/30/20   Antonieta Iba, MD  Glucose Blood (BLOOD GLUCOSE TEST STRIPS) STRP 1 strip by In  Vitro route daily. 11/24/18   Steele Sizer, MD  losartan (COZAAR) 100 MG tablet TAKE 1 TABLET(100 MG) BY MOUTH DAILY 09/30/20   Antonieta Iba, MD  magnesium oxide (MAG-OX) 400 MG tablet Take 400 mg by mouth daily.    [provider]  metFORMIN (GLUCOPHAGE) 500 MG tablet Take 500 mg by mouth daily with supper.    [provider]  Multiple Vitamin (MULTIVITAMIN) capsule Take 1 capsule by mouth daily.    [provider]  potassium chloride SA (KLOR-CON) 20 MEQ tablet Take 1 tablet (20 mEq total) by mouth daily. 09/29/20   Alver Sorrow, NP  rosuvastatin (CRESTOR) 20 MG tablet TAKE 1 TABLET(20 MG) BY MOUTH DAILY 10/05/20   Antonieta Iba, MD  sildenafil (REVATIO) 20 MG  tablet TAKE 1-5 TABLETS BY MOUTH DAILY AS NEEDED 12/15/19   Antonieta Iba, MD    Inpatient Medications: Scheduled Meds:  Continuous Infusions: . diltiazem (CARDIZEM) infusion 7.5 mg/hr (11/07/20 1318)   PRN Meds:   Allergies:   Allergies  Allergen Reactions  . Spironolactone Other (See Comments)    Gynomastia  . Penicillins Hives and Rash       . Atorvastatin Other (See Comments)    Myalgias   . Crestor [Rosuvastatin Calcium] Other (See Comments)    Myalgias   . Simvastatin Other (See Comments)    Myalgias    Social History:   Social History   Socioeconomic History  . Marital status: Married    Spouse name: Not on file  . Number of children: Not on file  . Years of education: Not on file  . Highest education level: Not on file  Occupational History  . Not on file  Tobacco Use  . Smoking status: Former Smoker    Types: Cigars    Quit date: 12/08/2013    Years since quitting: 6.9  . Smokeless tobacco: Never Used  Vaping Use  . Vaping Use: Not on file  Substance and Sexual Activity  . Alcohol use: No    Alcohol/week: 1.0 standard drink    Types: 1 Glasses of wine per week    Comment: occasional  . Drug use: No  . Sexual activity: Not on file  Other Topics  Concern  . Not on file  Social History Narrative  . Not on file   Social Determinants of Health   Financial Resource Strain: Not on file  Food Insecurity: Not on file  Transportation Needs: Not on file  Physical Activity: Not on file  Stress: Not on file  Social Connections: Not on file  Intimate Partner Violence: Not on file     Family History:   Family History  Problem Relation Age of Onset  . Heart attack Father 34       MI  . Heart disease Father        CABG  . Hypertension Mother   . Hypertension Sister     ROS:  Review of Systems  Constitutional: Positive for malaise/fatigue. Negative for chills, diaphoresis, fever and weight loss.  HENT: Negative for congestion.   Eyes: Negative for discharge and redness.  Respiratory: Negative for cough, sputum production, shortness of breath and wheezing.   Cardiovascular: Positive for palpitations. Negative for chest pain, orthopnea, claudication, leg swelling and PND.  Gastrointestinal: Negative for abdominal pain, blood in stool, heartburn, melena, nausea and vomiting.  Musculoskeletal: Negative for falls and myalgias.  Skin: Negative for rash.  Neurological: Negative for dizziness, tingling, tremors, sensory change, speech change, focal weakness, loss of consciousness and weakness.  Endo/Heme/Allergies: Does not bruise/bleed easily.  Psychiatric/Behavioral: Negative for substance abuse. The patient is not nervous/anxious.   All other systems reviewed and are negative.     Physical Exam/Data:   Vitals:   11/07/20 1120 11/07/20 1141 11/07/20 1145 11/07/20 1300  BP:  (!) 117/96 (!) 117/96 (!) 137/99  Pulse:  (!) 152 (!) 154 (!) 139  Resp:  18 (!) 27 (!) 32  Temp:  98.2 F (36.8 C)    TempSrc:  Oral    SpO2:  96% 96% 92%  Weight: 78 kg     Height: 5\' 6"  (1.676 m)      No intake or output data in the 24 hours ending 11/07/20 1322 11/09/20  11/07/20 1120  Weight: 78 kg   Body mass index is 27.76 kg/m.    Physical Exam: General: Well developed, well nourished, in no acute distress. Head: Normocephalic, atraumatic, sclera non-icteric, no xanthomas, nares without discharge.  Neck: Negative for carotid bruits. JVD not elevated. Lungs: Clear bilaterally to auscultation without wheezes, rales, or rhonchi. Breathing is unlabored. Heart: Tachycardic, IRIR with S1 S2. No murmurs, rubs, or gallops appreciated. Abdomen: Soft, non-tender, non-distended with normoactive bowel sounds. No hepatomegaly. No rebound/guarding. No obvious abdominal masses. Msk:  Strength and tone appear normal for age. Extremities: No clubbing or cyanosis. No edema. Distal pedal pulses are 2+ and equal bilaterally. Neuro: Alert and oriented X 3. No facial asymmetry. No focal deficit. Moves all extremities spontaneously. Psych:  Responds to questions appropriately with a normal affect.   EKG:  The EKG was personally reviewed and demonstrates: Afib with RVR, 139 bpm, rare PVC, poor R wave progression along the precordial leads, nonspecific st/t changes  Telemetry:  Telemetry was personally reviewed and demonstrates: Afib with RVR with ventricular rates in the 1-teens to 130s bpm  Weights: Filed Weights   11/07/20 1120  Weight: 78 kg    Relevant CV Studies:  2D echo 12/22/201: 1. Left ventricular ejection fraction, by estimation, is 55 to 60%. The  left ventricle has normal function. The left ventricle has no regional  wall motion abnormalities. There is mild left ventricular hypertrophy.  Left ventricular diastolic parameters  are indeterminate.  2. Right ventricular systolic function is low normal. The right  ventricular size is mildly enlarged.  3. Left atrial size was severely dilated.  4. Right atrial size was moderately dilated.  5. The mitral valve is normal in structure. Mild mitral valve  regurgitation.  6. The aortic valve is tricuspid. Aortic valve regurgitation is not  visualized. Mild aortic valve  sclerosis is present, with no evidence of  aortic valve stenosis.  7. The inferior vena cava is normal in size with greater than 50%  respiratory variability, suggesting right atrial pressure of 3 mmHg. __________  48-hour Holter 10/2015: Average heart rate 65 bpm, maximum heart rate 121 bpm Normal sinus rhythm with APCs and PVCs, including couplets, runs of bigeminy Short episodes of Idioventricular escape rhythm noted rate of 53 bpm Several episodes of episodes of supraventricular tachycardia, 6 beats __________  48-hour Holter 10/2015: Normal sinus rhythm, Frequent APCs and PVCs, Rare short runs of narrow complex tachycardia, Rare short runs of NSVT __________  2D echo 10/2015: - Left ventricle: The cavity size was mildly dilated. There was  mild concentric hypertrophy. Systolic function was normal. The  estimated ejection fraction was in the range of 55% to 60%. Wall  motion was normal; there were no regional wall motion  abnormalities. Features are consistent with a pseudonormal left  ventricular filling pattern, with concomitant abnormal relaxation  and increased filling pressure (grade 2 diastolic dysfunction).  - Mitral valve: There was moderate regurgitation.  - Left atrium: The atrium was moderately dilated.  - Pulmonary arteries: Systolic pressure was mildly increased. PA  peak pressure: 40 mm Hg (S). __________  [facility] 12/2013: PCI to mid LCx and D1 __________  [facility] 11/2013: D1 80%, mid LCx 90%, RPL2 99% s/p PCI/DES   Laboratory Data:  Chemistry Recent Labs  Lab 11/07/20 1131  NA 137  K 3.7  CL 101  CO2 24  GLUCOSE 204*  BUN 40*  CREATININE 1.46*  CALCIUM 9.4  GFRNONAA 52*  ANIONGAP 12    No  results for input(s): PROT, ALBUMIN, AST, ALT, ALKPHOS, BILITOT in the last 168 hours. Hematology Recent Labs  Lab 11/07/20 1131  WBC 12.8*  RBC 5.38  HGB 16.1  HCT 47.4  MCV 88.1  MCH 29.9  MCHC 34.0  RDW 12.0  PLT 231   Cardiac  EnzymesNo results for input(s): TROPONINI in the last 168 hours. No results for input(s): TROPIPOC in the last 168 hours.  BNPNo results for input(s): BNP, PROBNP in the last 168 hours.  DDimer No results for input(s): DDIMER in the last 168 hours.  Radiology/Studies:  DG Chest 1 View  Result Date: 11/07/2020 IMPRESSION: No acute cardiopulmonary process. Electronically Signed   By: Acquanetta Belling M.D.   On: 11/07/2020 11:50    Assessment and Plan:   1. Persistent Afib with RVR: -Status post recent briefly successful DCCV on 09/22/2020 with recurrent Afib noted in the office earlier today with Dr. Azucena Cecil. In this setting, he was sent to the ED with recommendation for repeat DCCV on 11/08/2020. He does not want to be placed on amiodarone after reading about the side effects and class 1c medications are contraindicated due to his history of CAD -Decrease caffeine intake   -NPO at midnight -Eliquis 5 mg bid -Continue Cardizem gtt and PTA Coreg for rate control  -In sinus rhythm, he has a mildly bradycardic heart rate, therefore will defer transition to metoprolol at this time in an effort to minimize significant bradycardia post DCCV -Would hold PTA losartan in an effort to maximize BP room for rate control medications as needed -Schedule DCCV 11/08/2020 -Recommend EP follow up as an outpatient for consideration of Afib ablation vs alternative AAT given he is quite symptomatic with his Afib  2. CAD involving the native coronary arteries without angina: -Initial HS-Tn 20 with delta troponin pending and is likely elevated in the setting of supply demand ischemia in the context of Afib with RVR and underlying CAD -Eliquis in place of ASA -Coreg  3. HTN: -Blood pressure reasonably controlled -Continue current medical therapy as outlined above  4. HLD: -LDL 57 from 08/2020 -Crestor and Zetia -Outpatient follow up   For questions or updates, please contact [facility] Please consult  [email or web address] for contact info under Cardiology/STEMI.   Signed, Eula Listen, PA-C [facility] Pager: [phone] 11/07/2020, 1:22 PM

## 2020-11-07 NOTE — Progress Notes (Signed)
Cardiology Office Note:    Date:  11/07/2020   ID:  Clinton Dorsey, DOB September 14, 1951, MRN [id number]  PCP:  [name], MD   [facility]  Cardiologist:  [name], MD  Advanced Practice Provider:  No care team member to display Electrophysiologist:  None       Referring MD: [name], MD   Chief Complaint  Patient presents with  . OTHER    C/o being in A-fib today since last night according to his watch. Medications verbally reviewed with patient.     History of Present Illness:    Clinton Dorsey is a 70 y.o. male with a hx of CAD/NSTEMI PCI to RCA, LCx, D1, paroxysmal A. fib s/p DC cardioversion 09/22/2020, OSA who presents with symptoms of dizziness, palpitations.  She states having symptoms of palpitations last night, felt dizzy, checked his smart watch which revealed atrial fibrillation with heart rates up to 140s beats per minute. Symptoms continued this morning. States feeling okay after his cardioversion on 09/22/2020 until yesterday. Takes all his medications as prescribed, takes Eliquis as prescribed, has not missed a dose over the past 4 weeks at least.  Past Medical History:  Diagnosis Date  . Arrhythmia   . Coronary artery disease    a. NSTEMI; b. 12/23/13 cath 11/2013: D1 80%, LCx 90% failed PCI, dRCA 99%, PL branch 99% s/p PCI/DES,  EF 55%; c. staged cardiac cath 12/2013: s/p PCI/DES to both D1 and mLCx, resdial LM 40%, ostial LCx 50%  . Depression   . Depression   . Diabetes mellitus (HCC)    a. poorly controlled  . Hyperlipidemia   . Hypertension   . Hypokalemia    a. recurrent  . Kidney stones   . MI (myocardial infarction) (HCC) 11/2013  . Mitral regurgitation    a. echo 11/2013: EF 55-60%, mild LVH, mildly dilated LA, mild TR, mild to mod MR, mildly elevated RVSP    . Obesity   . Tobacco abuse     Past Surgical History:  Procedure Laterality Date  . CARDIAC CATHETERIZATION  12/08/2013  . CARDIOVERSION N/A  09/22/2020   Procedure: CARDIOVERSION;  Surgeon: Antonieta Iba, MD;  Location: [facility] ORS;  Service: Cardiovascular;  Laterality: N/A;  . CORONARY ANGIOPLASTY  12/08/2013   s/p stent placement  . HERNIA REPAIR    . LAMINECTOMY    . PERCUTANEOUS CORONARY STENT INTERVENTION (PCI-S) N/A 12/23/2013   Procedure: PERCUTANEOUS CORONARY STENT INTERVENTION (PCI-S);  Surgeon: [name], MD;  Location: [facility] CATH LAB;  Service: Cardiovascular;  Laterality: N/A;  . SPINE SURGERY  2014  . TONSILLECTOMY      Current Medications: Current Meds  Medication Sig  . buPROPion (WELLBUTRIN XL) 150 MG 24 hr tablet Take 1 tablet (150 mg total) by mouth daily.  . carvedilol (COREG) 25 MG tablet Take 25 mg by mouth 2 (two) times daily.  . chlorthalidone (HYGROTON) 25 MG tablet Take 1 tablet (25 mg total) by mouth daily.  . cholecalciferol (VITAMIN D3) 25 MCG (1000 UNIT) tablet Take 1,000 Units by mouth daily.  . Coenzyme Q10 10 MG capsule Take 10 mg by mouth daily.   Marland Kitchen diltiazem (CARDIZEM CD) 120 MG 24 hr capsule Take 1 capsule (120 mg total) by mouth daily.  Marland Kitchen diltiazem (CARDIZEM) 30 MG tablet Take 1 tablet (30 mg) by mouth three times a day as needed for heart rates > 130 bpm  . ELIQUIS 5 MG TABS tablet TAKE 1  TABLET(5 MG) BY MOUTH TWICE DAILY  . ezetimibe (ZETIA) 10 MG tablet TAKE 1 TABLET(10 MG) BY MOUTH DAILY  . Glucose Blood (BLOOD GLUCOSE TEST STRIPS) STRP 1 strip by In Vitro route daily.  Marland Kitchen losartan (COZAAR) 100 MG tablet TAKE 1 TABLET(100 MG) BY MOUTH DAILY  . magnesium oxide (MAG-OX) 400 MG tablet Take 400 mg by mouth daily.  . metFORMIN (GLUCOPHAGE) 500 MG tablet Take 500 mg by mouth daily with supper.  . Multiple Vitamin (MULTIVITAMIN) capsule Take 1 capsule by mouth daily.  . potassium chloride SA (KLOR-CON) 20 MEQ tablet Take 1 tablet (20 mEq total) by mouth daily.  . rosuvastatin (CRESTOR) 20 MG tablet TAKE 1 TABLET(20 MG) BY MOUTH DAILY  . sildenafil (REVATIO) 20 MG tablet TAKE 1-5 TABLETS BY  MOUTH DAILY AS NEEDED     Allergies:   Spironolactone, Penicillins, Atorvastatin, Crestor [rosuvastatin calcium], and Simvastatin   Social History   Socioeconomic History  . Marital status: Married    Spouse name: Not on file  . Number of children: Not on file  . Years of education: Not on file  . Highest education level: Not on file  Occupational History  . Not on file  Tobacco Use  . Smoking status: Former Smoker    Types: Cigars    Quit date: 12/08/2013    Years since quitting: 6.9  . Smokeless tobacco: Never Used  Vaping Use  . Vaping Use: Not on file  Substance and Sexual Activity  . Alcohol use: No    Alcohol/week: 1.0 standard drink    Types: 1 Glasses of wine per week    Comment: occasional  . Drug use: No  . Sexual activity: Not on file  Other Topics Concern  . Not on file  Social History Narrative  . Not on file   Social Determinants of Health   Financial Resource Strain: Not on file  Food Insecurity: Not on file  Transportation Needs: Not on file  Physical Activity: Not on file  Stress: Not on file  Social Connections: Not on file     Family History: The patient's family history includes Heart attack (age of onset: 64) in his father; Heart disease in his father; Hypertension in his mother and sister.  ROS:   Please see the history of present illness.     All other systems reviewed and are negative.  EKGs/Labs/Other Studies Reviewed:    The following studies were reviewed today:   EKG:  EKG is  ordered today.  The ekg ordered today demonstrates atrial fibrillation, rapid ventricular response, heart rate 139  Recent Labs: 09/07/2020: TSH 1.782 09/29/2020: ALT 40 10/24/2020: BUN 35; Creatinine, Ser 1.17; Potassium 3.9; Sodium 140 11/07/2020: Hemoglobin 16.1; Platelets 231  Recent Lipid Panel    Component Value Date/Time   CHOL 114 09/29/2020 0920   CHOL 164 07/10/2016 1525   CHOL 182 12/07/2013 0956   TRIG 102 09/29/2020 0920   TRIG 108  07/10/2016 1525   TRIG 224 (H) 12/07/2013 0956   HDL 38 (L) 09/29/2020 0920   HDL 33 (L) 12/07/2013 0956   CHOLHDL 3.0 09/29/2020 0920   VLDL 22 07/10/2016 1525   VLDL 45 (H) 12/07/2013 0956   LDLCALC 57 09/29/2020 0920   LDLCALC 104 (H) 12/07/2013 0956     Risk Assessment/Calculations:      Physical Exam:    VS:  BP 128/90 (BP Location: Left Arm, Patient Position: Sitting, Cuff Size: Normal)   Pulse (!) 139   Ht  5\' 5"  (1.651 m)   Wt 172 lb (78 kg)   SpO2 97%   BMI 28.62 kg/m     Wt Readings from Last 3 Encounters:  11/07/20 172 lb (78 kg)  11/07/20 172 lb (78 kg)  10/24/20 173 lb 8 oz (78.7 kg)     GEN:  Well nourished, appears fatigued HEENT: Normal NECK: No JVD; No carotid bruits LYMPHATICS: No lymphadenopathy CARDIAC: Irregular irregular, tachycardic RESPIRATORY:  Clear to auscultation without rales, wheezing or rhonchi  ABDOMEN: Soft, non-tender, non-distended MUSCULOSKELETAL:  No edema; No deformity  SKIN: Warm and dry NEUROLOGIC:  Alert and oriented x 3 PSYCHIATRIC:  Normal affect   ASSESSMENT:    1. Atrial fibrillation with RVR (HCC)   2. Essential hypertension   3. Coronary artery disease of native artery of native heart with stable angina pectoris (HCC)    PLAN:    In order of problems listed above:  1. Atrial fibrillation rapid ventricular response, patient symptomatic with palpitations, dizziness, fatigue. Will transfer patient to the ED for admission and cardioversion. Antiarrhythmics/amiodarone was discussed, patient does not want to take amiodarone due to reading about side effects. Class Ic agents contraindicated due to his history of CAD/multiple PCI. Plan for DC cardioversion tomorrow. Continue Eliquis.  Chances of maintaining sinus rhythm significantly reduced in light of severely dilated left atrium on last echocardiogram. Will also refer patient to EP for ablation consideration in light of very symptomatic A. fib. 2. Hypertension, BP  controlled, continue current meds. 3. CAD/PCI.  Eliquis, Crestor   Follow-up with Dr. Maurilio Lovely after cardioversion    Medication Adjustments/Labs and Tests Ordered: Current medicines are reviewed at length with the patient today.  Concerns regarding medicines are outlined above.  Orders Placed This Encounter  Procedures  . Ambulatory referral to Cardiac Electrophysiology  . EKG 12-Lead   No orders of the defined types were placed in this encounter.   Patient Instructions  Medication Instructions:  Your physician recommends that you continue on your current medications as directed. Please refer to the Current Medication list given to you today.  *If you need a refill on your cardiac medications before your next appointment, please call your pharmacy*  Follow-Up:  You have been referred to Electrophysiology to see Dr Lalla Brothers.    At [facility], you and your health needs are our priority.  As part of our continuing mission to provide you with exceptional heart care, we have created designated Provider Care Teams.  These Care Teams include your primary Cardiologist (physician) and Advanced Practice Providers (APPs -  Physician Assistants and Nurse Practitioners) who all work together to provide you with the care you need, when you need it.  We recommend signing up for the patient portal called "MyChart".  Sign up information is provided on this After Visit Summary.  MyChart is used to connect with patients for Virtual Visits (Telemedicine).  Patients are able to view lab/test results, encounter notes, upcoming appointments, etc.  Non-urgent messages can be sent to your provider as well.   To learn more about what you can do with MyChart, go to [email or web address].    Your next appointment:   3 week(s)  The format for your next appointment:   In Person  Provider:   You may see [name], MD or one of the following Advanced Practice Providers on your designated Care  Team:    Nicolasa Ducking, NP  Eula Listen, PA-C  Marisue Ivan, PA-C  Cadence Fransico Michael, PA-C  Gillian Shields,  NP     Signed, Debbe Odea, MD  11/07/2020 12:15 PM    [facility]

## 2020-11-08 ENCOUNTER — Inpatient Hospital Stay: Payer: BC Managed Care – PPO | Admitting: Registered Nurse

## 2020-11-08 ENCOUNTER — Encounter
Admission: EM | Disposition: A | Discharge status: Home / Self Care | Payer: Self-pay | Source: Home / Self Care | Attending: Emergency Medicine

## 2020-11-08 ENCOUNTER — Other Ambulatory Visit: Payer: Self-pay

## 2020-11-08 ENCOUNTER — Encounter: Payer: Self-pay | Admitting: Internal Medicine

## 2020-11-08 ENCOUNTER — Telehealth: Payer: Self-pay | Admitting: *Deleted

## 2020-11-08 DIAGNOSIS — F32A Depression, unspecified: Secondary | ICD-10-CM

## 2020-11-08 DIAGNOSIS — I4891 Unspecified atrial fibrillation: Secondary | ICD-10-CM

## 2020-11-08 DIAGNOSIS — E1169 Type 2 diabetes mellitus with other specified complication: Secondary | ICD-10-CM

## 2020-11-08 DIAGNOSIS — I2510 Atherosclerotic heart disease of native coronary artery without angina pectoris: Secondary | ICD-10-CM | POA: Diagnosis not present

## 2020-11-08 DIAGNOSIS — I10 Essential (primary) hypertension: Secondary | ICD-10-CM

## 2020-11-08 DIAGNOSIS — I252 Old myocardial infarction: Secondary | ICD-10-CM | POA: Diagnosis not present

## 2020-11-08 DIAGNOSIS — I25119 Atherosclerotic heart disease of native coronary artery with unspecified angina pectoris: Secondary | ICD-10-CM | POA: Diagnosis not present

## 2020-11-08 DIAGNOSIS — D6869 Other thrombophilia: Secondary | ICD-10-CM

## 2020-11-08 DIAGNOSIS — E119 Type 2 diabetes mellitus without complications: Secondary | ICD-10-CM | POA: Diagnosis not present

## 2020-11-08 DIAGNOSIS — E785 Hyperlipidemia, unspecified: Secondary | ICD-10-CM

## 2020-11-08 HISTORY — PX: CARDIOVERSION: SHX1299

## 2020-11-08 LAB — GLUCOSE, CAPILLARY
Glucose-Capillary: 167 mg/dL — ABNORMAL HIGH (ref 70–99)
Glucose-Capillary: 143 mg/dL — ABNORMAL HIGH (ref 70–99)
Glucose-Capillary: 228 mg/dL — ABNORMAL HIGH (ref 70–99)

## 2020-11-08 SURGERY — CARDIOVERSION
Anesthesia: General

## 2020-11-08 MED ORDER — DRONEDARONE HCL 400 MG PO TABS: 400.0000 mg | ORAL_TABLET | Freq: Two times a day (BID) | ORAL | 0 refills | Status: DC

## 2020-11-08 MED ORDER — PROPOFOL 10 MG/ML IV BOLUS
INTRAVENOUS | Status: DC | PRN
  Administered 2020-11-08: 50 mg via INTRAVENOUS

## 2020-11-08 MED ORDER — MELATONIN 3 MG PO TABS
3.0000 mg | ORAL_TABLET | Freq: Every day | ORAL | Status: DC
  Administered 2020-11-08: 3 mg via ORAL
  Filled 2020-11-08 (×2): qty 1

## 2020-11-08 MED ORDER — SODIUM CHLORIDE 0.9 % IV SOLN: INTRAVENOUS | Status: DC | PRN

## 2020-11-08 NOTE — Discharge Instructions (Signed)
Stop the cardizem cd  Atrial Fibrillation  Atrial fibrillation is a type of irregular or rapid heartbeat (arrhythmia). In atrial fibrillation, the top part of the heart (atria) beats in an irregular pattern. This makes the heart unable to pump blood normally and effectively. The goal of treatment is to prevent blood clots from forming, control your heart rate, or restore your heartbeat to a normal rhythm. If this condition is not treated, it can cause serious problems, such as a weakened heart muscle (cardiomyopathy) or a stroke. What are the causes? This condition is often caused by medical conditions that damage the heart's electrical system. These include:  High blood pressure (hypertension). This is the most common cause.  Certain heart problems or conditions, such as heart failure, coronary artery disease, heart valve problems, or heart surgery.  Diabetes.  Overactive thyroid (hyperthyroidism).  Obesity.  Chronic kidney disease. In some cases, the cause of this condition is not known. What increases the risk? This condition is more likely to develop in:  Older people.  People who smoke.  Athletes who do endurance exercise.  People who have a family history of atrial fibrillation.  Men.  People who use drugs.  People who drink a lot of alcohol.  People who have lung conditions, such as emphysema, pneumonia, or COPD.  People who have obstructive sleep apnea. What are the signs or symptoms? Symptoms of this condition include:  A feeling that your heart is racing or beating irregularly.  Discomfort or pain in your chest.  Shortness of breath.  Sudden light-headedness or weakness.  Tiring easily during exercise or activity.  Fatigue.  Syncope (fainting).  Sweating. In some cases, there are no symptoms. How is this diagnosed? Your health care provider may detect atrial fibrillation when taking your pulse. If detected, this condition may be diagnosed  with:  An electrocardiogram (ECG) to check electrical signals of the heart.  An ambulatory cardiac monitor to record your heart's activity for a few days.  A transthoracic echocardiogram (TTE) to create pictures of your heart.  A transesophageal echocardiogram (TEE) to create even closer pictures of your heart.  A stress test to check your blood supply while you exercise.  Imaging tests, such as a CT scan or chest X-ray.  Blood tests. How is this treated? Treatment depends on underlying conditions and how you feel when you experience atrial fibrillation. This condition may be treated with:  Medicines to prevent blood clots or to treat heart rate or heart rhythm problems.  Electrical cardioversion to reset the heart's rhythm.  A pacemaker to correct abnormal heart rhythm.  Ablation to remove the heart tissue that sends abnormal signals.  Left atrial appendage closure to seal the area where blood clots can form. In some cases, underlying conditions will be treated. Follow these instructions at home: Medicines  Take over-the counter and prescription medicines only as told by your health care provider.  Do not take any new medicines without talking to your health care provider.  If you are taking blood thinners: ? Talk with your health care provider before you take any medicines that contain aspirin or NSAIDs, such as ibuprofen. These medicines increase your risk for dangerous bleeding. ? Take your medicine exactly as told, at the same time every day. ? Avoid activities that could cause injury or bruising, and follow instructions about how to prevent falls. ? Wear a medical alert bracelet or carry a card that lists what medicines you take. Lifestyle  Do not use any  products that contain nicotine or tobacco, such as cigarettes, e-cigarettes, and chewing tobacco. If you need help quitting, ask your health care provider.  Eat heart-healthy foods. Talk with a dietitian to make an  eating plan that is right for you.  Exercise regularly as told by your health care provider.  Do not drink alcohol.  Lose weight if you are overweight.  Do not use drugs, including cannabis.      General instructions  If you have obstructive sleep apnea, manage your condition as told by your health care provider.  Do not use diet pills unless your health care provider approves. Diet pills can make heart problems worse.  Keep all follow-up visits as told by your health care provider. This is important. Contact a health care provider if you:  Notice a change in the rate, rhythm, or strength of your heartbeat.  Are taking a blood thinner and you notice more bruising.  Tire more easily when you exercise or do heavy work.  Have a sudden change in weight. Get help right away if you have:  Chest pain, abdominal pain, sweating, or weakness.  Trouble breathing.  Side effects of blood thinners, such as blood in your vomit, stool, or urine, or bleeding that cannot stop.  Any symptoms of a stroke. "BE FAST" is an easy way to remember the main warning signs of a stroke: ? B - Balance. Signs are dizziness, sudden trouble walking, or loss of balance. ? E - Eyes. Signs are trouble seeing or a sudden change in vision. ? F - Face. Signs are sudden weakness or numbness of the face, or the face or eyelid drooping on one side. ? A - Arms. Signs are weakness or numbness in an arm. This happens suddenly and usually on one side of the body. ? S - Speech. Signs are sudden trouble speaking, slurred speech, or trouble understanding what people say. ? T - Time. Time to call emergency services. Write down what time symptoms started.  Other signs of a stroke, such as: ? A sudden, severe headache with no known cause. ? Nausea or vomiting. ? Seizure. These symptoms may represent a serious problem that is an emergency. Do not wait to see if the symptoms will go away. Get medical help right away. Call  your local emergency services (911 in the [place] Do not drive yourself to the hospital.   Summary  Atrial fibrillation is a type of irregular or rapid heartbeat (arrhythmia).  Symptoms include a feeling that your heart is beating fast or irregularly.  You may be given medicines to prevent blood clots or to treat heart rate or heart rhythm problems.  Get help right away if you have signs or symptoms of a stroke.  Get help right away if you cannot catch your breath or have chest pain or pressure. This information is not intended to replace advice given to you by your health care provider. Make sure you discuss any questions you have with your health care provider. Document Revised: 02/25/2019 Document Reviewed: 02/25/2019 Elsevier Patient Education  2021 [employer].

## 2020-11-08 NOTE — Progress Notes (Signed)
Patient discharged per orders. Medicare and AVS documentation provided to patient and wife. PIVs and tele removed from patient.

## 2020-11-08 NOTE — Transfer of Care (Signed)
Immediate Anesthesia Transfer of Care Note  Patient: Clinton Dorsey  Procedure(s) Performed: CARDIOVERSION (N/A )  Patient Location: PACU  Anesthesia Type:General  Level of Consciousness: awake, alert  and oriented  Airway & Oxygen Therapy: Patient Spontanous Breathing and Patient connected to nasal cannula oxygen  Post-op Assessment: Report given to RN and Post -op Vital signs reviewed and stable  Post vital signs: Reviewed and stable  Last Vitals:  Vitals Value Taken Time  BP 132/91 11/08/20 1315  Temp    Pulse 56 11/08/20 1318  Resp 29 11/08/20 1318  SpO2 93 % 11/08/20 1318  Vitals shown include unvalidated device data.  Last Pain:  Vitals:   11/08/20 1241  TempSrc: Oral  PainSc: 0-No pain         Complications: No complications documented.

## 2020-11-08 NOTE — Progress Notes (Signed)
Report given and patient taken to rm 246 via bed tolerated well wife at bedside.

## 2020-11-08 NOTE — Interval H&P Note (Signed)
History and Physical Interval Note:  11/08/2020 1:15 PM  Clinton Dorsey  has presented today for surgery, with the diagnosis of atrial fibrillation with rapid ventricular response.  The various methods of treatment have been discussed with the patient and family. After consideration of risks, benefits and other options for treatment, the patient has consented to  Procedure(s): CARDIOVERSION (N/A) as a surgical intervention.  The patient's history has been reviewed, patient examined, no change in status, stable for surgery.  I have reviewed the patient's chart and labs.  Questions were answered to the patient's satisfaction.     Christopher End

## 2020-11-08 NOTE — Anesthesia Postprocedure Evaluation (Signed)
Anesthesia Post Note  Patient: Clinton Dorsey  Procedure(s) Performed: CARDIOVERSION (N/A )  Patient location during evaluation: PACU Anesthesia Type: General Level of consciousness: awake and alert Pain management: pain level controlled Vital Signs Assessment: post-procedure vital signs reviewed and stable Respiratory status: spontaneous breathing, nonlabored ventilation, respiratory function stable and patient connected to nasal cannula oxygen Cardiovascular status: blood pressure returned to baseline and stable Postop Assessment: no apparent nausea or vomiting Anesthetic complications: no   No complications documented.   Last Vitals:  Vitals:   11/08/20 1330 11/08/20 1345  BP: 94/66 109/78  Pulse: (!) 55 (!) 55  Resp: (!) 27 20  Temp:    SpO2: 98% 99%    Last Pain:  Vitals:   11/08/20 1319  TempSrc:   PainSc: 0-No pain                 Yevette Edwards

## 2020-11-08 NOTE — Care Management Obs Status (Signed)
MEDICARE OBSERVATION STATUS NOTIFICATION   Patient Details  Name: Clinton Dorsey MRN: [id number] Date of Birth: Jun 23, 1951   Medicare Observation Status Notification Given:  Yes    Villa Herb, LCSWA 11/08/2020, 5:14 PM

## 2020-11-08 NOTE — Discharge Summary (Signed)
[facility] -  at [facility]   PATIENT NAME: Clinton Dorsey    MR#:  [id number]  DATE OF BIRTH:  1951-08-16  DATE OF ADMISSION:  11/07/2020 ADMITTING PHYSICIAN: Alford Highland, MD  DATE OF DISCHARGE: 11/08/2020  5:41 PM  PRIMARY CARE PHYSICIAN: [name], MD    ADMISSION DIAGNOSIS:  Palpitations [R00.2] Pain [R52] Atrial fibrillation with rapid ventricular response (HCC) [I48.91] Generalized weakness [R53.1] Atrial fibrillation with RVR (HCC) [I48.91] Atrial fibrillation (HCC) [I48.91]  DISCHARGE DIAGNOSIS:  Principal Problem:   Atrial fibrillation with RVR (HCC) Active Problems:   Hypokalemia   CAD (coronary artery disease)   Diabetes mellitus (HCC)   Depression   Atrial fibrillation (HCC)   SECONDARY DIAGNOSIS:   Past Medical History:  Diagnosis Date  . Arrhythmia   . Coronary artery disease    a. NSTEMI; b. 12/23/13 cath 11/2013: D1 80%, LCx 90% failed PCI, dRCA 99%, PL branch 99% s/p PCI/DES,  EF 55%; c. staged cardiac cath 12/2013: s/p PCI/DES to both D1 and mLCx, resdial LM 40%, ostial LCx 50%  . Depression   . Depression   . Diabetes mellitus (HCC)    a. poorly controlled  . Hyperlipidemia   . Hypertension   . Hypokalemia    a. recurrent  . Kidney stones   . MI (myocardial infarction) (HCC) 11/2013  . Mitral regurgitation    a. echo 11/2013: EF 55-60%, mild LVH, mildly dilated LA, mild TR, mild to mod MR, mildly elevated RVSP    . Obesity   . Tobacco abuse     HOSPITAL COURSE:   1.  Atrial fibrillation with rapid ventricular rate.  Patient was started on Cardizem drip in the emergency room.  Patient was kept on oral Cardizem CD and Coreg.  Patient on Eliquis for stroke reduction.  Patient was set up for cardioversion on 11/08/2020.  As per cardiology the cardioversion was successful and the patient is in sinus rhythm.  Patient will be started on Multaq 400 mg twice a day with continuing of Coreg and discontinuing Cardizem  CD.  Continue Eliquis for stroke reduction. 2.  Acquired thrombophilia secondary to atrial fibrillation.  Continue Eliquis for stroke reduction. 3.  Type 2 diabetes mellitus with hyperlipidemia.  Continue Zetia and Crestor.  Hemoglobin A1c slightly elevated at 7.2.  On diet control and Metformin. 4.  Essential hypertension on Coreg, losartan. 5.  Depression on Wellbutrin  DISCHARGE CONDITIONS:   Satisfactory  CONSULTS OBTAINED:  Treatment Team:  [name], MD  DRUG ALLERGIES:   Allergies  Allergen Reactions  . Spironolactone Other (See Comments)    Gynomastia  . Penicillins Hives and Rash       . Atorvastatin Other (See Comments)    Myalgias   . Simvastatin Other (See Comments)    Myalgias    DISCHARGE MEDICATIONS:   Allergies as of 11/08/2020      Reactions   Spironolactone Other (See Comments)   Gynomastia   Penicillins Hives, Rash      Atorvastatin Other (See Comments)   Myalgias   Simvastatin Other (See Comments)   Myalgias      Medication List    STOP taking these medications   chlorthalidone 25 MG tablet Commonly known as: HYGROTON   diltiazem 120 MG 24 hr capsule Commonly known as: CARDIZEM CD     TAKE these medications   BLOOD GLUCOSE TEST STRIPS Strp 1 strip by In Vitro route daily.   buPROPion 150 MG 24 hr  tablet Commonly known as: WELLBUTRIN XL Take 1 tablet (150 mg total) by mouth daily.   carvedilol 25 MG tablet Commonly known as: COREG Take 25 mg by mouth 2 (two) times daily.   cholecalciferol 25 MCG (1000 UNIT) tablet Commonly known as: VITAMIN D3 Take 1,000 Units by mouth daily.   Coenzyme Q10 10 MG capsule Take 10 mg by mouth daily.   diltiazem 30 MG tablet Commonly known as: CARDIZEM Take 1 tablet (30 mg) by mouth three times a day as needed for heart rates > 130 bpm   dronedarone 400 MG tablet Commonly known as: MULTAQ Take 1 tablet (400 mg total) by mouth 2 (two) times daily with a meal. Start taking on:  November 09, 2020   Eliquis 5 MG Tabs tablet Generic drug: apixaban TAKE 1 TABLET(5 MG) BY MOUTH TWICE DAILY What changed: See the new instructions.   ezetimibe 10 MG tablet Commonly known as: ZETIA TAKE 1 TABLET(10 MG) BY MOUTH DAILY What changed: See the new instructions.   losartan 100 MG tablet Commonly known as: COZAAR TAKE 1 TABLET(100 MG) BY MOUTH DAILY What changed: See the new instructions.   magnesium oxide 400 MG tablet Commonly known as: MAG-OX Take 400 mg by mouth daily.   metFORMIN 500 MG tablet Commonly known as: GLUCOPHAGE Take 500 mg by mouth daily with supper.   multivitamin capsule Take 1 capsule by mouth daily.   potassium chloride SA 20 MEQ tablet Commonly known as: KLOR-CON Take 1 tablet (20 mEq total) by mouth daily.   rosuvastatin 20 MG tablet Commonly known as: CRESTOR TAKE 1 TABLET(20 MG) BY MOUTH DAILY What changed: See the new instructions.   sildenafil 20 MG tablet Commonly known as: REVATIO TAKE 1-5 TABLETS BY MOUTH DAILY AS NEEDED What changed: See the new instructions.        DISCHARGE INSTRUCTIONS:   Follow-up PMD 5 days Follow-up cardiology  If you experience worsening of your admission symptoms, develop shortness of breath, life threatening emergency, suicidal or homicidal thoughts you must seek medical attention immediately by calling 911 or calling your MD immediately  if symptoms less severe.  You Must read complete instructions/literature along with all the possible adverse reactions/side effects for all the Medicines you take and that have been prescribed to you. Take any new Medicines after you have completely understood and accept all the possible adverse reactions/side effects.   Please note  You were cared for by a hospitalist during your hospital stay. If you have any questions about your discharge medications or the care you received while you were in the hospital after you are discharged, you can call the unit and  asked to speak with the hospitalist on call if the hospitalist that took care of you is not available. Once you are discharged, your primary care physician will handle any further medical issues. Please note that NO REFILLS for any discharge medications will be authorized once you are discharged, as it is imperative that you return to your primary care physician (or establish a relationship with a primary care physician if you do not have one) for your aftercare needs so that they can reassess your need for medications and monitor your lab values.    Today   CHIEF COMPLAINT:   Weakness and palpitations  HISTORY OF PRESENT ILLNESS:  Kalev Temme  is a 70 y.o. male with a known history of atrial fibrillation presented with weakness and palpitations   VITAL SIGNS:  Blood pressure 121/88, pulse 62,  temperature 98.6 F (37 C), temperature source Oral, resp. rate 16, height 5\' 6"  (1.676 m), weight 78 kg, SpO2 97 %.  I/O:    Intake/Output Summary (Last 24 hours) at 11/08/2020 1931 Last data filed at 11/08/2020 1318 Gross per 24 hour  Intake 337.31 ml  Output 100 ml  Net 237.31 ml    PHYSICAL EXAMINATION:  GENERAL:  70 y.o.-year-old patient lying in the bed with no acute distress.  EYES: Pupils equal, round, reactive to light and accommodation. No scleral icterus.  HEENT: Head atraumatic, normocephalic. Oropharynx and nasopharynx clear.   LUNGS: Normal breath sounds bilaterally, no wheezing, rales,rhonchi or crepitation. No use of accessory muscles of respiration.  CARDIOVASCULAR: S1, S2 normal. No murmurs, rubs, or gallops.  ABDOMEN: Soft, non-tender, non-distended. Bowel sounds present. No organomegaly or mass.  EXTREMITIES: No pedal edema.  NEUROLOGIC: Cranial nerves II through XII are intact. Muscle strength 5/5 in all extremities. Sensation intact. Gait not checked.  PSYCHIATRIC: The patient is alert and oriented x 3.  SKIN: No obvious rash, lesion, or ulcer.   DATA REVIEW:    CBC Recent Labs  Lab 11/07/20 1131  WBC 12.8*  HGB 16.1  HCT 47.4  PLT 231    Chemistries  Recent Labs  Lab 11/07/20 1131  NA 137  K 3.7  CL 101  CO2 24  GLUCOSE 204*  BUN 40*  CREATININE 1.46*  CALCIUM 9.4    Microbiology Results  Results for orders placed or performed during the hospital encounter of 11/07/20  Resp Panel by RT-PCR (Flu A&B, Covid) Nasopharyngeal Swab     Status: None   Collection Time: 11/07/20 12:50 PM   Specimen: Nasopharyngeal Swab; Nasopharyngeal(NP) swabs in vial transport medium  Result Value Ref Range Status   SARS Coronavirus 2 by RT PCR NEGATIVE NEGATIVE Final    Comment: (NOTE) SARS-CoV-2 target nucleic acids are NOT DETECTED.  The SARS-CoV-2 RNA is generally detectable in upper respiratory specimens during the acute phase of infection. The lowest concentration of SARS-CoV-2 viral copies this assay can detect is 138 copies/mL. A negative result does not preclude SARS-Cov-2 infection and should not be used as the sole basis for treatment or other patient management decisions. A negative result may occur with  improper specimen collection/handling, submission of specimen other than nasopharyngeal swab, presence of viral mutation(s) within the areas targeted by this assay, and inadequate number of viral copies(<138 copies/mL). A negative result must be combined with clinical observations, patient history, and epidemiological information. The expected result is Negative.  Fact Sheet for Patients:  11/09/20  Fact Sheet for Healthcare Providers:  BloggerCourse.com  This test is no t yet approved or cleared by the [employer] FDA and  has been authorized for detection and/or diagnosis of SARS-CoV-2 by FDA under an Emergency Use Authorization (EUA). This EUA will remain  in effect (meaning this test can be used) for the duration of the COVID-19 declaration under Section  564(b)(1) of the Act, 21 U.S.C.section 360bbb-3(b)(1), unless the authorization is terminated  or revoked sooner.       Influenza A by PCR NEGATIVE NEGATIVE Final   Influenza B by PCR NEGATIVE NEGATIVE Final    Comment: (NOTE) The Xpert Xpress SARS-CoV-2/FLU/RSV plus assay is intended as an aid in the diagnosis of influenza from Nasopharyngeal swab specimens and should not be used as a sole basis for treatment. Nasal washings and aspirates are unacceptable for Xpert Xpress SARS-CoV-2/FLU/RSV testing.  Fact Sheet for Patients: [place]  Fact Sheet for  Healthcare Providers: SeriousBroker.it  This test is not yet approved or cleared by the [place] and has been authorized for detection and/or diagnosis of SARS-CoV-2 by FDA under an Emergency Use Authorization (EUA). This EUA will remain in effect (meaning this test can be used) for the duration of the COVID-19 declaration under Section 564(b)(1) of the Act, 21 U.S.C. section 360bbb-3(b)(1), unless the authorization is terminated or revoked.  Performed at [facility], [address] [place], [place] [address]     RADIOLOGY:  DG Chest 1 View  Result Date: 11/07/2020 CLINICAL DATA:  Weakness Atrial fibrillation with RVR EXAM: CHEST  1 VIEW COMPARISON:  07/29/2020 FINDINGS: The heart size and mediastinal contours are within normal limits. Both lungs are clear. The visualized skeletal structures are unremarkable. IMPRESSION: No acute cardiopulmonary process. Electronically Signed   By: Acquanetta Belling M.D.   On: 11/07/2020 11:50     Management plans discussed with the patient, and he is in agreement.  CODE STATUS:     Code Status Orders  (From admission, onward)         Start     Ordered   11/07/20 1337  Full code  Continuous        11/07/20 1338        Code Status History    Date Active Date Inactive Code Status Order ID Comments User  Context   09/07/2020 1150 09/08/2020 2101 Full Code [id number]  Lucile Shutters, MD ED   05/27/2015 0119 05/27/2015 1731 Full Code [id number]  Arnaldo Natal, MD Inpatient   12/23/2013 1833 12/24/2013 1834 Full Code [id number]  [name], MD Inpatient   Advance Care Planning Activity      TOTAL TIME TAKING CARE OF THIS PATIENT: 32 minutes.    Alford Highland M.D on 11/08/2020 at 7:31 PM  Between 7am to 6pm - Pager - [phone]  After 6pm go to [email or web address] - password [email or web address]  [facility]  CC: Primary care physician; [name], MD

## 2020-11-08 NOTE — Care Management CC44 (Signed)
Condition Code 44 Documentation Completed  Patient Details  Name: Clinton Dorsey MRN: [id number] Date of Birth: 04-26-1951   Condition Code 44 given:  Yes Patient signature on Condition Code 44 notice:  Yes Documentation of 2 MD's agreement:  Yes Code 44 added to claim:  Yes    Villa Herb, LCSWA 11/08/2020, 5:14 PM

## 2020-11-08 NOTE — Anesthesia Preprocedure Evaluation (Signed)
Anesthesia Evaluation  Patient identified by MRN, date of birth, ID band Patient awake    Reviewed: Allergy & Precautions, H&P , NPO status , Patient's Chart, lab work & pertinent test results, reviewed documented beta blocker date and time   Airway Mallampati: II   Neck ROM: full    Dental  (+) Poor Dentition   Pulmonary neg pulmonary ROS, former smoker,    Pulmonary exam normal        Cardiovascular Exercise Tolerance: Poor hypertension, On Medications + angina with exertion + CAD and + Past MI  Atrial Fibrillation  Rhythm:regular Rate:Normal     Neuro/Psych PSYCHIATRIC DISORDERS Anxiety Depression negative neurological ROS     GI/Hepatic negative GI ROS, Neg liver ROS,   Endo/Other  negative endocrine ROSdiabetes  Renal/GU Renal disease  negative genitourinary   Musculoskeletal   Abdominal   Peds  Hematology negative hematology ROS (+)   Anesthesia Other Findings Past Medical History: No date: Arrhythmia No date: Coronary artery disease     Comment:  a. NSTEMI; b. 12/23/13 cath 11/2013: D1 80%, LCx 90% failed              PCI, dRCA 99%, PL branch 99% s/p PCI/DES,  EF 55%; c.               staged cardiac cath 12/2013: s/p PCI/DES to both D1 and               mLCx, resdial LM 40%, ostial LCx 50% No date: Depression No date: Depression No date: Diabetes mellitus (HCC)     Comment:  a. poorly controlled No date: Hyperlipidemia No date: Hypertension No date: Hypokalemia     Comment:  a. recurrent No date: Kidney stones 11/2013: MI (myocardial infarction) (HCC) No date: Mitral regurgitation     Comment:  a. echo 11/2013: EF 55-60%, mild LVH, mildly dilated LA,               mild TR, mild to mod MR, mildly elevated RVSP   No date: Obesity No date: Tobacco abuse Past Surgical History: 12/08/2013: CARDIAC CATHETERIZATION 09/22/2020: CARDIOVERSION; N/A     Comment:  Procedure: CARDIOVERSION;  Surgeon: Antonieta Iba,               MD;  Location: [facility] ORS;  Service: Cardiovascular;                Laterality: N/A; 12/08/2013: CORONARY ANGIOPLASTY     Comment:  s/p stent placement No date: HERNIA REPAIR No date: LAMINECTOMY 12/23/2013: PERCUTANEOUS CORONARY STENT INTERVENTION (PCI-S); N/A     Comment:  Procedure: PERCUTANEOUS CORONARY STENT INTERVENTION               (PCI-S);  Surgeon: [name], MD;  Location: [facility]               CATH LAB;  Service: Cardiovascular;  Laterality: N/A; 2014: SPINE SURGERY No date: TONSILLECTOMY BMI    Body Mass Index: 27.75 kg/m     Reproductive/Obstetrics negative OB ROS                             Anesthesia Physical Anesthesia Plan  ASA: III  Anesthesia Plan: General   Post-op Pain Management:    Induction:   PONV Risk Score and Plan:   Airway Management Planned:   Additional Equipment:   Intra-op Plan:   Post-operative Plan:   Informed Consent:  I have reviewed the patients History and Physical, chart, labs and discussed the procedure including the risks, benefits and alternatives for the proposed anesthesia with the patient or authorized representative who has indicated his/her understanding and acceptance.     Dental Advisory Given  Plan Discussed with: CRNA  Anesthesia Plan Comments:         Anesthesia Quick Evaluation

## 2020-11-08 NOTE — Progress Notes (Signed)
Progress Note  Patient Name: Clinton Dorsey Date of Encounter: 11/08/2020  [facility] Cardiologist: [name], MD   Subjective   No palpitations, chest pain, or shortness of breath.  Refused Multaq last night and this AM.  Inpatient Medications    Scheduled Meds: . [MAR Hold] apixaban  5 mg Oral BID  . [MAR Hold] buPROPion  150 mg Oral Daily  . [MAR Hold] carvedilol  25 mg Oral BID  . [MAR Hold] cholecalciferol  1,000 Units Oral Daily  . [MAR Hold] dronedarone  400 mg Oral BID WC  . [MAR Hold] ezetimibe  10 mg Oral Daily  . [MAR Hold] insulin aspart  0-15 Units Subcutaneous TID WC  . [MAR Hold] losartan  100 mg Oral Daily  . [MAR Hold] magnesium oxide  400 mg Oral Daily  . [MAR Hold] melatonin  3 mg Oral QHS  . [MAR Hold] multivitamin with minerals  1 tablet Oral Daily  . [MAR Hold] rosuvastatin  20 mg Oral QHS   Continuous Infusions:  PRN Meds: [MAR Hold] acetaminophen, [MAR Hold] ondansetron (ZOFRAN) IV   Vital Signs    Vitals:   11/08/20 1318 11/08/20 1319 11/08/20 1330 11/08/20 1345  BP:  114/81 94/66 109/78  Pulse: 75 61 (!) 55 (!) 55  Resp: (!) 26 (!) 28 (!) 27 20  Temp:      TempSrc:      SpO2: 97% 95% 98% 99%  Weight:      Height:        Intake/Output Summary (Last 24 hours) at 11/08/2020 1356 Last data filed at 11/08/2020 1318 Gross per 24 hour  Intake 337.31 ml  Output 100 ml  Net 237.31 ml   Last 3 Weights 11/08/2020 11/07/2020 11/07/2020  Weight (lbs) 171 lb 15.3 oz 171 lb 9.6 oz 172 lb  Weight (kg) 78 kg 77.837 kg 78.019 kg      Telemetry    A-fib - Personally Reviewed  ECG    A-fib with poor R wave progression - Personally Reviewed  Physical Exam   GEN: No acute distress.   Neck: No JVD Cardiac: IRRR, no murmurs, rubs, or gallops.  Respiratory: Clear to auscultation bilaterally. GI: Soft, nontender, non-distended  MS: No edema; No deformity. Neuro:  Nonfocal  Psych: Normal affect   Labs    High Sensitivity Troponin:    Recent Labs  Lab 11/07/20 1131 11/07/20 1324  TROPONINIHS 20* 16      Chemistry Recent Labs  Lab 11/07/20 1131  NA 137  K 3.7  CL 101  CO2 24  GLUCOSE 204*  BUN 40*  CREATININE 1.46*  CALCIUM 9.4  GFRNONAA 52*  ANIONGAP 12     Hematology Recent Labs  Lab 11/07/20 1131  WBC 12.8*  RBC 5.38  HGB 16.1  HCT 47.4  MCV 88.1  MCH 29.9  MCHC 34.0  RDW 12.0  PLT 231    BNPNo results for input(s): BNP, PROBNP in the last 168 hours.   DDimer No results for input(s): DDIMER in the last 168 hours.   Radiology    DG Chest 1 View  Result Date: 11/07/2020 CLINICAL DATA:  Weakness Atrial fibrillation with RVR EXAM: CHEST  1 VIEW COMPARISON:  07/29/2020 FINDINGS: The heart size and mediastinal contours are within normal limits. Both lungs are clear. The visualized skeletal structures are unremarkable. IMPRESSION: No acute cardiopulmonary process. Electronically Signed   By: Acquanetta Belling M.D.   On: 11/07/2020 11:50    Cardiac Studies  TTE (09/07/2020): 1. Left ventricular ejection fraction, by estimation, is 55 to 60%. The  left ventricle has normal function. The left ventricle has no regional  wall motion abnormalities. There is mild left ventricular hypertrophy.  Left ventricular diastolic parameters  are indeterminate.  2. Right ventricular systolic function is low normal. The right  ventricular size is mildly enlarged.  3. Left atrial size was severely dilated.  4. Right atrial size was moderately dilated.  5. The mitral valve is normal in structure. Mild mitral valve  regurgitation.  6. The aortic valve is tricuspid. Aortic valve regurgitation is not  visualized. Mild aortic valve sclerosis is present, with no evidence of  aortic valve stenosis.  7. The inferior vena cava is normal in size with greater than 50%  respiratory variability, suggesting right atrial pressure of 3 mmHg.   Patient Profile     70 y.o. male with history of CAD with NSTEMI in  11/2013 s/p PCI/DES toRPL2on3/24/2015 andstaged PCIto mid LCx and D1 on4/04/2014, DM2, persistent Afib on Eliquis s/p recent briefly successful DCCV in 09/2020, HTN, HLD, strong family history of CAD, tobacco use, and obesity, admitted with a-fib with RVR.  Assessment & Plan    A-fib with RVR: Improved rate control overnight, now s/p DCCV this afternoon with restoration of sinus rhythm.  Patient prescribed Multaq but has been refusing.  Rationale regarding antiarrhythmic therapy pending EP consultation explained to patient and his wife and they are agreeable to trying Multaq.  Continue carvedilol 25 mg BID.  Continue apixaban 5 mg BID.  Start dronedarone 400 mg BID.  Defer restarting oral diltiazem given borderline low resting heart rate.  Outpatient EP consultation.  CAD: No active symptoms.  Continue secondary prevention.  DM:  Per hospitalist team.  HTN: BP labile but overall normal to low-normal.  Continue carvedilol and losartan.  Defer restarting dilt, as above.  [facility] will sign off.   Medication Recommendations:  See above. Other recommendations (labs, testing, etc):  None. Follow up as an outpatient:  F/u in office in 1-2 weeks.  EP consultation.  For questions or updates, please contact [facility] Please consult [email or web address] for contact info under [facility].     Signed, Yvonne Kendall, MD  11/08/2020, 1:56 PM

## 2020-11-08 NOTE — CV Procedure (Signed)
    Cardioversion Note  Clinton Dorsey [id number] 22-Jan-1951  Procedure: DC Cardioversion Indications: Paroxysmal atrial fibrillation  Procedure Details Consent: Obtained Time Out: Verified patient identification, verified procedure, site/side was marked, verified correct patient position, special equipment/implants available, Radiology Safety Procedures followed,  medications/allergies/relevent history reviewed, required imaging and test results available.  Performed  The patient has been on adequate anticoagulation.  The patient received IV propofol by anesthesia for sedation.  Synchronous cardioversion was performed at 200 joules x 1.  The cardioversion was successful with conversion to sinus rhythm with PAC's.  Complications: No apparent complications Patient did tolerate procedure well.  Recommendations: Patient agreeable with dronedarone 400 mg BID, which should be continued pending outpatient EP consultation.  Continue indefinite apixaban 5 mg BID.  Yvonne Kendall., MD 11/08/2020, 1:30 PM

## 2020-11-08 NOTE — Telephone Encounter (Signed)
-----   Message from Yvonne Kendall, MD sent at 11/08/2020  2:30 PM EST ----- Regarding: EP consult and f/u Mr. Bonenberger will likely be d/c'ed from [facility] this afternoon or tomorrow.  Can you arrange for him to be seen by Dr. Mariah Milling or an APP in 1-2 weeks?  Could you also arrange for him to see Dr. Lalla Brothers for management of PAF?  Thanks.  Thayer Ohm

## 2020-11-09 ENCOUNTER — Encounter: Payer: Self-pay | Admitting: Internal Medicine

## 2020-11-09 ENCOUNTER — Telehealth: Payer: Self-pay

## 2020-11-09 NOTE — Telephone Encounter (Signed)
Patient contacted regarding discharge from [facility] on 11/08/20.  Patient understands to follow up with provider Milas Gain, NP on 11/17/20 at 1100 at [facility] Patient also has a follow up with Dr. Lalla Brothers scheduled on 11/30/20.   Patient understands discharge instructions? yes Patient understands medications and regiment? Yes, Patient started his MULTAQ last night though instructions were to start this morning. Patient was given 4 days worth as pharmacy had to order the medication. I called the pharmacy and they should have it in by this afternoon. He did stop taking his Chlorthalidone, and Diltiazem. Patient understands to bring all medications to this visit? yes  Ask patient:  Are you enrolled in My Chart, YES.   If no ask patient if they would like to enroll.   .             Do you have help at home with ADL's? Yes patients wife works from home.      Patient stated that his heart rate has been in the 60's since he has been home.

## 2020-11-15 DIAGNOSIS — E119 Type 2 diabetes mellitus without complications: Secondary | ICD-10-CM | POA: Diagnosis not present

## 2020-11-15 NOTE — Telephone Encounter (Signed)
Patient has appointment on 11/17/20.

## 2020-11-16 ENCOUNTER — Encounter: Payer: Self-pay | Admitting: Family

## 2020-11-16 ENCOUNTER — Ambulatory Visit (INDEPENDENT_AMBULATORY_CARE_PROVIDER_SITE_OTHER): Payer: BC Managed Care – PPO | Admitting: Family

## 2020-11-16 ENCOUNTER — Other Ambulatory Visit: Payer: Self-pay

## 2020-11-16 VITALS — BP 140/98 | HR 49 | Ht 66.0 in | Wt 170.0 lb

## 2020-11-16 DIAGNOSIS — G4733 Obstructive sleep apnea (adult) (pediatric): Secondary | ICD-10-CM

## 2020-11-16 DIAGNOSIS — I4819 Other persistent atrial fibrillation: Secondary | ICD-10-CM

## 2020-11-16 DIAGNOSIS — Z7901 Long term (current) use of anticoagulants: Secondary | ICD-10-CM | POA: Diagnosis not present

## 2020-11-16 DIAGNOSIS — I25118 Atherosclerotic heart disease of native coronary artery with other forms of angina pectoris: Secondary | ICD-10-CM

## 2020-11-16 DIAGNOSIS — E785 Hyperlipidemia, unspecified: Secondary | ICD-10-CM

## 2020-11-16 DIAGNOSIS — I10 Essential (primary) hypertension: Secondary | ICD-10-CM

## 2020-11-16 MED ORDER — CARVEDILOL 25 MG PO TABS: 12.5000 mg | ORAL_TABLET | Freq: Two times a day (BID) | ORAL | Status: DC

## 2020-11-16 MED ORDER — TELMISARTAN 40 MG PO TABS: 40.0000 mg | ORAL_TABLET | Freq: Every day | ORAL | 2 refills | Status: DC

## 2020-11-16 MED ORDER — HYDRALAZINE HCL 10 MG PO TABS
25.0000 mg | ORAL_TABLET | Freq: Once | ORAL | Status: AC
  Administered 2020-11-16: 25 mg via ORAL

## 2020-11-16 NOTE — Progress Notes (Signed)
Office Visit    Patient Name: Clinton Dorsey Date of Encounter: 11/16/2020  Primary Care Provider:  [name], MD Primary Cardiologist:  [name], MD Electrophysiologist:  None   Chief Complaint    Clinton Dorsey is a 70 y.o. male with a hx of CAD s/p NSTEMI 11/2013 s/p DES to RPL2 12/09/13 and staged PCI to mid LCx and D1 12/23/2013, DM2, persistent atrial fibrillation on Eliquis, HTN, HLD, tobacco use, obesity presents today for lightheadedness, elevated blood pressure.  Past Medical History    Past Medical History:  Diagnosis Date  . Arrhythmia   . Coronary artery disease    a. NSTEMI; b. 12/23/13 cath 11/2013: D1 80%, LCx 90% failed PCI, dRCA 99%, PL branch 99% s/p PCI/DES,  EF 55%; c. staged cardiac cath 12/2013: s/p PCI/DES to both D1 and mLCx, resdial LM 40%, ostial LCx 50%  . Depression   . Depression   . Diabetes mellitus (HCC)    a. poorly controlled  . Hyperlipidemia   . Hypertension   . Hypokalemia    a. recurrent  . Kidney stones   . MI (myocardial infarction) (HCC) 11/2013  . Mitral regurgitation    a. echo 11/2013: EF 55-60%, mild LVH, mildly dilated LA, mild TR, mild to mod MR, mildly elevated RVSP    . Obesity   . Tobacco abuse    Past Surgical History:  Procedure Laterality Date  . CARDIAC CATHETERIZATION  12/08/2013  . CARDIOVERSION N/A 09/22/2020   Procedure: CARDIOVERSION;  Surgeon: Antonieta Iba, MD;  Location: [facility] ORS;  Service: Cardiovascular;  Laterality: N/A;  . CARDIOVERSION N/A 11/08/2020   Procedure: CARDIOVERSION;  Surgeon: Yvonne Kendall, MD;  Location: [facility] ORS;  Service: Cardiovascular;  Laterality: N/A;  . CORONARY ANGIOPLASTY  12/08/2013   s/p stent placement  . HERNIA REPAIR    . LAMINECTOMY    . PERCUTANEOUS CORONARY STENT INTERVENTION (PCI-S) N/A 12/23/2013   Procedure: PERCUTANEOUS CORONARY STENT INTERVENTION (PCI-S);  Surgeon: [name], MD;  Location: [facility] CATH LAB;  Service: Cardiovascular;  Laterality: N/A;   . SPINE SURGERY  2014  . TONSILLECTOMY     Allergies  Allergies  Allergen Reactions  . Spironolactone Other (See Comments)    Gynomastia  . Penicillins Hives and Rash       . Atorvastatin Other (See Comments)    Myalgias   . Simvastatin Other (See Comments)    Myalgias    History of Present Illness    Clinton Dorsey is a 70 y.o. male with a hx of CAD s/p NSTEMI 11/2013 s/p DES to RPL2 12/09/13 and staged PCI to mid LCx and D1 12/23/2013, DM2, persistent atrial fibrillation on Eliquis, HTN, HLD, tobacco use, obesity last seen while hospitalized.  Admitted to [facility] 11/2013 for NSTEMI. Echo with LVEF 55-60%, mild LVH, mild dilated LA, mild TR, mild-moderate MR, RVSP mildly elevated. Cardiac cath with PCI/DES to distal RCA/osital PL branch and staged PCI 12/2013 at [facility] with DES to mic LCx and D1. Noted residual 40% left main stenosis and 50% ostial LCx stenosis.   Seen 10/2015 with palpitations. Echo at that time LVEF 55-60%, mild concentric LVH, gr2DD, moderate MR, moderately dilated LA, PASP . 40 hour holter SR with frequent PAC/PVC, rare short runs of narrow complex tachycardia and NSVT. Repeat 48 hour Holter 10/2015 with SR, PAC/PVC with runs of bigmeniny, short episode idioventricular rhythm, several episodes of SVT.   Seen in ED 07/2019 and diagnosed with atrial fibrillation. Placed on Eliquis  and converted to SR with diltiazem. Office visit 08/17/20 with improved though still elevated BP readings and EKG SB 59bpm. Underlying sinus bradycardia had precluded escalation of Diltiazem.   Admitted 09/07/20-09/08/20 with atrial fib with RVR. Echo with LVEF 55-60%, no RWMA, mild LVH, indeterminite LV diastolic parameters, low normal RVSF with mildly enlarged RV cavity size, severely dilated LA 72mm, moderately dilated right atrium, mild MR, mild aortic valve sclerosis without stenosis. He remained in atrial fibrillation and his Diltiazem was titrated.   Seen in follow up 09/20/20 and scheduled  for cardioversion. Underwent cardioversion with Dr. Mariah Milling 09/22/20. Called the office 09/26/20 nothing HR in the 40s. Recommended to reduce dose of Diltiazem from 240mg  to 120mg . Seen in follow up 09/29/20. BP was routinely elevated and Chlorthalidone and Potassium were added to regimen.   In clinic 10/24/20 he was maintaining NSR with no recurrent bradycardia. No changes were made. He called the office 11/07/20 noting atrial fibrillation. Seen in clinic by Dr. 12/22/20, he was transferred to the ED for admission and cardioversion as he was extremely symptomatic. Cardioversion was performed 11/08/20. He was started on Multaq 400mg  BID, Coreg continued, and Diltiazem discontinued. Chlorthalidone was also discontinued.   Presents today for follow up with his wife who is an Clinton Dorsey. Tells me he has felt fatigued, lightheaded since being home. Denies syncope, chest pain. Endorses dyspnea on exertion. Heart rate routinely 40s-50s. BP has been elevated with home readings routinely <160/80.   EKGs/Labs/Other Studies Reviewed:   The following studies were reviewed today:  2D echo 12/22/201: 1. Left ventricular ejection fraction, by estimation, is 55 to 60%. The  left ventricle has normal function. The left ventricle has no regional  wall motion abnormalities. There is mild left ventricular hypertrophy.  Left ventricular diastolic parameters  are indeterminate.   2. Right ventricular systolic function is low normal. The right  ventricular size is mildly enlarged.   3. Left atrial size was severely dilated.   4. Right atrial size was moderately dilated.   5. The mitral valve is normal in structure. Mild mitral valve  regurgitation.   6. The aortic valve is tricuspid. Aortic valve regurgitation is not  visualized. Mild aortic valve sclerosis is present, with no evidence of  aortic valve stenosis.   7. The inferior vena cava is normal in size with greater than 50%  respiratory variability, suggesting right  atrial pressure of 3 mmHg. __________   48-hour Holter 10/2015: Average heart rate 65 bpm, maximum heart rate 121 bpm Normal sinus rhythm with APCs and PVCs, including couplets, runs of bigeminy Short episodes of Idioventricular escape rhythm noted rate of 53 bpm Several episodes of episodes of supraventricular tachycardia, 6 beats __________   48-hour Holter 10/2015: Normal sinus rhythm, Frequent APCs and PVCs, Rare short runs of narrow complex tachycardia, Rare short runs of NSVT __________   2D echo 10/2015: - Left ventricle: The cavity size was mildly dilated. There was    mild concentric hypertrophy. Systolic function was normal. The    estimated ejection fraction was in the range of 55% to 60%. Wall    motion was normal; there were no regional wall motion    abnormalities. Features are consistent with a pseudonormal left    ventricular filling pattern, with concomitant abnormal relaxation    and increased filling pressure (grade 2 diastolic dysfunction).  - Mitral valve: There was moderate regurgitation.  - Left atrium: The atrium was moderately dilated.  - Pulmonary arteries: Systolic pressure was mildly increased.  PA    peak pressure: 40 mm Hg (S). __________   [facility] 12/2013: PCI to mid LCx and D1 __________   [facility] 11/2013: D1 80%, mid LCx 90%, RPL2 99% s/p PCI/DES  EKG:  EKG is ordered today.  The ekg ordered today demonstrates SB 49 bpm with stable prior septal infarct pattern. No acute ST/T wave changes.   Recent Labs: 09/29/2020: ALT 40 11/07/2020: BUN 40; Creatinine, Ser 1.46; Hemoglobin 16.1; Platelets 231; Potassium 3.7; Sodium 137; TSH 1.056  Recent Lipid Panel    Component Value Date/Time   CHOL 114 09/29/2020 0920   CHOL 164 07/10/2016 1525   CHOL 182 12/07/2013 0956   TRIG 102 09/29/2020 0920   TRIG 108 07/10/2016 1525   TRIG 224 (H) 12/07/2013 0956   HDL 38 (L) 09/29/2020 0920   HDL 33 (L) 12/07/2013 0956   CHOLHDL 3.0 09/29/2020 0920   VLDL 22  07/10/2016 1525   VLDL 45 (H) 12/07/2013 0956   LDLCALC 57 09/29/2020 0920   LDLCALC 104 (H) 12/07/2013 0956    Home Medications   Current Meds  Medication Sig  . buPROPion (WELLBUTRIN XL) 150 MG 24 hr tablet Take 1 tablet (150 mg total) by mouth daily.  . cholecalciferol (VITAMIN D3) 25 MCG (1000 UNIT) tablet Take 1,000 Units by mouth daily.  . Coenzyme Q10 10 MG capsule Take 10 mg by mouth daily.   Marland Kitchen dronedarone (MULTAQ) 400 MG tablet Take 1 tablet (400 mg total) by mouth 2 (two) times daily with a meal.  . ELIQUIS 5 MG TABS tablet TAKE 1 TABLET(5 MG) BY MOUTH TWICE DAILY (Patient taking differently: Take 5 mg by mouth 2 (two) times daily.)  . ezetimibe (ZETIA) 10 MG tablet TAKE 1 TABLET(10 MG) BY MOUTH DAILY (Patient taking differently: Take 10 mg by mouth daily.)  . Glucose Blood (BLOOD GLUCOSE TEST STRIPS) STRP 1 strip by In Vitro route daily.  . magnesium oxide (MAG-OX) 400 MG tablet Take 400 mg by mouth daily.  . metFORMIN (GLUCOPHAGE) 500 MG tablet Take 500 mg by mouth daily with supper.  . Multiple Vitamin (MULTIVITAMIN) capsule Take 1 capsule by mouth daily.  . potassium chloride SA (KLOR-CON) 20 MEQ tablet Take 1 tablet (20 mEq total) by mouth daily.  . rosuvastatin (CRESTOR) 20 MG tablet TAKE 1 TABLET(20 MG) BY MOUTH DAILY (Patient taking differently: Take 20 mg by mouth daily.)  . sildenafil (REVATIO) 20 MG tablet TAKE 1-5 TABLETS BY MOUTH DAILY AS NEEDED (Patient taking differently: Take 20-100 mg by mouth daily as needed (erectile dysfunction).)  . telmisartan (MICARDIS) 40 MG tablet Take 1 tablet (40 mg total) by mouth daily.  . [DISCONTINUED] carvedilol (COREG) 25 MG tablet Take 25 mg by mouth 2 (two) times daily.  . [DISCONTINUED] losartan (COZAAR) 100 MG tablet TAKE 1 TABLET(100 MG) BY MOUTH DAILY (Patient taking differently: Take 100 mg by mouth daily.)    Review of Systems   All other systems reviewed and are otherwise negative except as noted above.  Physical Exam     VS:  BP (!) 140/98   Pulse (!) 49   Ht 5\' 6"  (1.676 m)   Wt 170 lb (77.1 kg)   BMI 27.44 kg/m  , BMI Body mass index is 27.44 kg/m.  Wt Readings from Last 3 Encounters:  11/16/20 170 lb (77.1 kg)  11/08/20 171 lb 15.3 oz (78 kg)  11/07/20 172 lb (78 kg)    GEN: Well nourished, well developed, in no acute distress. HEENT:  normal. Neck: Supple, no JVD, carotid bruits, or masses. Cardiac: bradycardia, RRR, no murmurs, rubs, or gallops. No clubbing, cyanosis, edema.  Radials/DP/PT 2+ and equal bilaterally.  Respiratory:  Respirations regular and unlabored, clear to auscultation bilaterally. GI: Soft, nontender, nondistended. MS: No deformity or atrophy. Skin: Warm and dry, no rash. Neuro:  Strength and sensation are intact. Psych: Normal affect.  Assessment & Plan    1. Persistent atrial fibrillation on chronic anticoagulation - Recent admission with cardioversion and started on Multaq due to persistent atrial fibrillation. Previous cardioversion 09/22/20 with restoration of NSR of <2 months. He has upcoming appointment with Dr. Lalla Brothers of EP for consideration of ablation. Continue Eliquis 5mg  BID due to [facility] of at least 3, denies bleeding complications. EKG today shows SB 49 bpm, continue Multaq 400mg  BID. Reduce Coreg 12.5mg  BID due to bradycardia which is likely the etiology of his lightheadedness, fatigue. CMP, CBC, magnesium for monitoring.   2. CAD - Stable with no anginal symptoms. GDMT includes beta-blocker, statin.  No aspirin secondary to chronic anticoagulation.Continue heart healthy diet and regular cardiovascular exercise.   3. HTN - BP elevated. Diltiazem and Chlorthalidone discontinued in hospital due to being started on Multaq. Initial BP in clinic 181/101, given Hydralazine 25mg , improved to 140/98. Previous intolerance to Amlodipine, Spironolactone. Reduce Coreg to 12.5mg  BID due to bradycardia. Stop Losartan. Start Telmisartan 40mg  daily. He will start at 40mg   dose and if SBP >160 two hours after morning dose, will increase to 80mg  QD dosing.   4. OSA - Diagnosed with CPAP and working on outside provider to get CPAP. Discussed role CPAP will have in control of atrial fibrillation.  5. HLD, LDL goal <70 - 09/29/20 LDL 57. Continue Crestor.   Disposition: Follow up in 2 weeks with Dr. Lalla Brothers of EP as scheduled  Signed, Alver Sorrow, NP 11/16/2020, 4:30 PM [facility]

## 2020-11-16 NOTE — Patient Instructions (Addendum)
Medication Instructions:  Your provider has recommended you make the following change in your medication:   STOP Losartan  START Telmisartan 40mg  daily *See additional instructions below*  CHANGE Carvedilol to 12.5mg  (half tablet) twice daily  *If you need a refill on your cardiac medications before your next appointment, please call your pharmacy*  Lab Work: Your provider recommends lab work today: CMP, magnesium, CBC  If you have labs (blood work) drawn today and your tests are completely normal, you will receive your results only by: MyChart Message (if you have MyChart) OR . A paper copy in the mail If you have any lab test that is abnormal or we need to change your treatment, we will call you to review the results.  Testing/Procedures: Your EKG today showed sinus bradycardia.    Follow-Up: At [facility], you and your health needs are our priority.  As part of our continuing mission to provide you with exceptional heart care, we have created designated Provider Care Teams.  These Care Teams include your primary Cardiologist (physician) and Advanced Practice Providers (APPs -  Physician Assistants and Nurse Practitioners) who all work together to provide you with the care you need, when you need it.  We recommend signing up for the patient portal called "MyChart".  Sign up information is provided on this After Visit Summary.  MyChart is used to connect with patients for Virtual Visits (Telemedicine).  Patients are able to view lab/test results, encounter notes, upcoming appointments, etc.  Non-urgent messages can be sent to your provider as well.   To learn more about what you can do with MyChart, go to [facility].    Your next appointment:   With [email or web address]. [email or web address] as scheduled  Other Instructions  Start with Telmisartan 40mg  (one tablet) tomorrow morning.  Recheck your blood pressure 2 hours after taking your medication.  If your systolic blood pressure (the  top number) is greater than 160, recommend taking an additional 40mg  tablet.

## 2020-11-17 ENCOUNTER — Ambulatory Visit: Payer: BC Managed Care – PPO | Admitting: Family

## 2020-11-17 ENCOUNTER — Telehealth: Payer: Self-pay | Admitting: Family

## 2020-11-17 LAB — COMPREHENSIVE METABOLIC PANEL
ALT: 44 IU/L (ref 0–44)
AST: 21 IU/L (ref 0–40)
Albumin/Globulin Ratio: 1.8 (ref 1.2–2.2)
Albumin: 4.6 g/dL (ref 3.8–4.8)
Alkaline Phosphatase: 58 IU/L (ref 44–121)
BUN/Creatinine Ratio: 17 (ref 10–24)
BUN: 24 mg/dL (ref 8–27)
Bilirubin Total: 0.6 mg/dL (ref 0.0–1.2)
CO2: 25 mmol/L (ref 20–29)
Calcium: 9.7 mg/dL (ref 8.6–10.2)
Chloride: 102 mmol/L (ref 96–106)
Creatinine, Ser: 1.38 mg/dL — ABNORMAL HIGH (ref 0.76–1.27)
Globulin, Total: 2.5 g/dL (ref 1.5–4.5)
Glucose: 145 mg/dL — ABNORMAL HIGH (ref 65–99)
Potassium: 4.3 mmol/L (ref 3.5–5.2)
Sodium: 142 mmol/L (ref 134–144)
Total Protein: 7.1 g/dL (ref 6.0–8.5)
eGFR: 55 mL/min/{1.73_m2} — ABNORMAL LOW (ref >59)

## 2020-11-17 LAB — CBC
Hematocrit: 41.9 % (ref 37.5–51.0)
Hemoglobin: 13.9 g/dL (ref 13.0–17.7)
MCH: 29.6 pg (ref 26.6–33.0)
MCHC: 33.2 g/dL (ref 31.5–35.7)
MCV: 89 fL (ref 79–97)
Platelets: 257 10*3/uL (ref 150–450)
RBC: 4.7 x10E6/uL (ref 4.14–5.80)
RDW: 12.1 % (ref 11.6–15.4)
WBC: 7 10*3/uL (ref 3.4–10.8)

## 2020-11-17 LAB — MAGNESIUM: Magnesium: 2.5 mg/dL — ABNORMAL HIGH (ref 1.6–2.3)

## 2020-11-17 MED ORDER — HYDRALAZINE HCL 50 MG PO TABS: ORAL_TABLET | ORAL | Status: DC

## 2020-11-17 MED ORDER — TELMISARTAN 40 MG PO TABS: 80.0000 mg | ORAL_TABLET | Freq: Every day | ORAL | 2 refills | Status: DC

## 2020-11-17 NOTE — Telephone Encounter (Signed)
Called to check on blood pressure and heart rate after office visit yesterday.   Tells me he took the first Telmisartan 40mg  this morning. His BP one hour afterward was 183/112. As directed, he took an additional Telmisartan 80mg . He rechecked his BP while on the phone with me with reading  172/111. He took the last dose of Telmisartan approximately 1.5 hours ago.   Yesterday in clinic he was given Hydralazine 25mg  with good response. Shares with me that yesterday he took Hydralazine 50mg  which he had at home as he had headache which he attributed to high blood pressure.   Heart rate 60 bpm today which is improved compared to previous. Tells me his energy level is slightly improved with higher heartrate.   Discussed and reviewed plan with him and his wife on the phone:  CHANGE Telmisartan to 80mg  daily in the morning START Hydralazine 25mg  as needed q6-8 hours for SBP >160.  He has Hydralazine 50mg  tablets at home which are in date and he will use by splitting in half.   If he is consistently requiring Hydralazine, may need to Rx consistent BID dosing. Will reassess via phone call or MyChart message tomorrow.   , NP

## 2020-11-18 ENCOUNTER — Encounter: Payer: Self-pay | Admitting: Family

## 2020-11-18 MED ORDER — HYDRALAZINE HCL 50 MG PO TABS: 25.0000 mg | ORAL_TABLET | Freq: Three times a day (TID) | ORAL | Status: DC

## 2020-11-18 NOTE — Telephone Encounter (Signed)
Sounds good. Thank you! Alver Sorrow, NP

## 2020-11-18 NOTE — Telephone Encounter (Signed)
Thank you for the update!  Recommend he continue Telmisartan 80mg  daily.Continue Coreg 12.5mg  twice daily. Agree with recommendation to take Hydralazine at time of phone call as you recommended.   With persistently elevated BP readings and good response to Hydralzine previously, recommend we transition the Hydralazine from PRN to 25mg  TID. He has 50mg  tablets at home which he can split in half. If we find that BP is persistently elevated despite regular Hydralazine dosage, we can consider increased dose in the future. If he takes a dose now (approx 11:15am) recommend second dose today around 4pm and then again at bedtime to get TID dosing in today. Tomorrow, can take morning, mid afternoon, and bedtime.   , NP

## 2020-11-18 NOTE — Addendum Note (Signed)
Addended by: Bryna Colander on: 11/18/2020 11:43 AM   Modules accepted: Orders

## 2020-11-18 NOTE — Telephone Encounter (Signed)
Spoke with patient and he is reporting consistently elevated blood pressures still. Inquired if he had taken any hydralazine and he denied that. Requested that he please take hydralazine and recheck blood pressures after that dose. He verbalized understanding and will take that. He is very concerned about these elevated readings and advised that we will try to help him get that under control. Will send provider secure chat message to please give [place] a call back with his readings. He verbalized understanding with no further questions at this time.

## 2020-11-18 NOTE — Telephone Encounter (Signed)
Spoke with patient and reviewed provider recommendations. He states that after we spoke he only took 1/2 tablet of hydralazine (25 mg) because his wife was concerned. Reviewed other recommendations and he will continue to monitor his readings. He verbalized understanding of recommendations, will continue hydralazine, and had no further questions at this time. He will keep [place] updated via My Chart or calls.

## 2020-11-18 NOTE — Telephone Encounter (Signed)
Pt c/o BP issue: STAT if pt c/o blurred vision, one-sided weakness or slurred speech  1. What are your last 5 BP readings? 183/107   190/102   2. Are you having any other symptoms (ex. Dizziness, headache, blurred vision, passed out)? Headache lightheaded   3. What is your BP issue? Too high uncontrolled with med changes wife concerned patient needs to seek care at [facility] to see if they can help resolve.

## 2020-11-20 ENCOUNTER — Telehealth: Payer: Self-pay | Admitting: Cardiology

## 2020-11-20 NOTE — Telephone Encounter (Signed)
    Spoke with patient regarding decreased UO since starting Multaq. He denies pain or blood presence. Per chart review, it appears his Cr was elevated above baseline on last labs 11/16/20. Appears that acute renal failure can be an adverse reaction to this medication. I have offered that the patient go to an urgent care or [facility] for labs to fully evaluate this however he has deferred for the moment and wishes to monitor at home for now and call the office tomorrow morning to check appointment availability. ED precautions reviewed.   Georgie Chard NP-C [facility] Pager: [phone]

## 2020-11-23 ENCOUNTER — Other Ambulatory Visit
Admission: RE | Admit: 2020-11-23 | Discharge: 2020-11-23 | Disposition: A | Discharge status: Home / Self Care | Payer: BC Managed Care – PPO | Attending: Family | Admitting: Family

## 2020-11-23 ENCOUNTER — Telehealth: Payer: Self-pay | Admitting: Family

## 2020-11-23 ENCOUNTER — Encounter: Payer: Self-pay | Admitting: Family

## 2020-11-23 DIAGNOSIS — L570 Actinic keratosis: Secondary | ICD-10-CM | POA: Diagnosis not present

## 2020-11-23 DIAGNOSIS — I25118 Atherosclerotic heart disease of native coronary artery with other forms of angina pectoris: Secondary | ICD-10-CM | POA: Diagnosis not present

## 2020-11-23 DIAGNOSIS — I10 Essential (primary) hypertension: Secondary | ICD-10-CM | POA: Diagnosis not present

## 2020-11-23 DIAGNOSIS — I2129 ST elevation (STEMI) myocardial infarction involving other sites: Secondary | ICD-10-CM | POA: Insufficient documentation

## 2020-11-23 DIAGNOSIS — D2371 Other benign neoplasm of skin of right lower limb, including hip: Secondary | ICD-10-CM | POA: Diagnosis not present

## 2020-11-23 DIAGNOSIS — D225 Melanocytic nevi of trunk: Secondary | ICD-10-CM | POA: Diagnosis not present

## 2020-11-23 DIAGNOSIS — L578 Other skin changes due to chronic exposure to nonionizing radiation: Secondary | ICD-10-CM | POA: Diagnosis not present

## 2020-11-23 LAB — BASIC METABOLIC PANEL
Anion gap: 12 (ref 5–15)
BUN: 27 mg/dL — ABNORMAL HIGH (ref 8–23)
CO2: 24 mmol/L (ref 22–32)
Calcium: 9.4 mg/dL (ref 8.9–10.3)
Chloride: 102 mmol/L (ref 98–111)
Creatinine, Ser: 1.06 mg/dL (ref 0.61–1.24)
GFR, Estimated: >60 mL/min (ref >60)
Glucose, Bld: 113 mg/dL — ABNORMAL HIGH (ref 70–99)
Potassium: 4.3 mmol/L (ref 3.5–5.1)
Sodium: 138 mmol/L (ref 135–145)

## 2020-11-23 LAB — BRAIN NATRIURETIC PEPTIDE: B Natriuretic Peptide: 408.1 pg/mL — ABNORMAL HIGH (ref 0.0–100.0)

## 2020-11-23 NOTE — Telephone Encounter (Signed)
Pt c/o swelling: STAT is pt has developed SOB within 24 hours  1) How much weight have you gained and in what time span? 3 lbs over the week  2) If swelling, where is the swelling located? None noticed  3) Are you currently taking a fluid pill? No, feels like he needs to pee but nothing comes out  4) Are you currently SOB? no  5) Do you have a log of your daily weights (if so, list)?   6) Have you gained 3 pounds in a day or 5 pounds in a week?   7) Have you traveled recently?  Patient reports he is drinking water, his BP is better. BUN and creatine levels have been running high and wife would like if patient could have those rechecked. Patient is taking Multaq since his hospital stay and a side effect is kidney damage per patient.  Please advise

## 2020-11-23 NOTE — Telephone Encounter (Signed)
Called patient and he is agreeable to [facility] this [facility]. Along with the symptoms in the first entry, he states its at night time when he gets up to void and has the urge but nothing really comes out. Recent BP readings are: 3/9 145/85  HR 60 3/8 161/88  3/7 172/85  States the SBP has come down some from the 170's but is still high. Reports having a headache he thinks that started after the Multaq. Advised we will let him know further recommendations after lab work has resulted.

## 2020-11-23 NOTE — Telephone Encounter (Signed)
Can we please ask Mr. Drudge to have BMP, BNP done [facility] this week at his convenience? Will allow fur [place] to reassess kidney function and fluid volume. Will allow [place] to see if any medication changes are needed. Recommend keeping appointment 11/30/20 with Dr. Lalla Brothers as scheduled. If he will provide a few recent BP/HR readings that would be helpful.  Thanks,  Alver Sorrow, NP

## 2020-11-24 ENCOUNTER — Other Ambulatory Visit: Payer: Self-pay | Admitting: Cardiovascular Disease

## 2020-11-24 ENCOUNTER — Other Ambulatory Visit: Payer: Self-pay | Admitting: *Deleted

## 2020-11-24 MED ORDER — FUROSEMIDE 20 MG PO TABS: 20.0000 mg | ORAL_TABLET | Freq: Every day | ORAL | 0 refills | Status: DC

## 2020-11-24 NOTE — Telephone Encounter (Signed)
Prescription refill request for Eliquis received. Indication: PAF Last office visit: 11/16/20 Scr: 1.06 on 11/23/20 Age: 70 Weight:  77.1kg  Based on the above findings Eliquis 5mg  daily is the appropriate dose.  Refill approved.

## 2020-11-24 NOTE — Telephone Encounter (Signed)
Refill request

## 2020-11-25 ENCOUNTER — Ambulatory Visit: Payer: BC Managed Care – PPO | Admitting: Physician Assistant

## 2020-11-30 ENCOUNTER — Other Ambulatory Visit: Payer: Self-pay

## 2020-11-30 ENCOUNTER — Ambulatory Visit (INDEPENDENT_AMBULATORY_CARE_PROVIDER_SITE_OTHER): Payer: BC Managed Care – PPO | Admitting: Cardiology

## 2020-11-30 ENCOUNTER — Encounter: Payer: Self-pay | Admitting: Cardiology

## 2020-11-30 VITALS — BP 160/96 | HR 64 | Ht 66.0 in | Wt 172.2 lb

## 2020-11-30 DIAGNOSIS — I4819 Other persistent atrial fibrillation: Secondary | ICD-10-CM

## 2020-11-30 DIAGNOSIS — I4891 Unspecified atrial fibrillation: Secondary | ICD-10-CM | POA: Diagnosis not present

## 2020-11-30 DIAGNOSIS — I10 Essential (primary) hypertension: Secondary | ICD-10-CM

## 2020-11-30 DIAGNOSIS — I2510 Atherosclerotic heart disease of native coronary artery without angina pectoris: Secondary | ICD-10-CM | POA: Diagnosis not present

## 2020-11-30 MED ORDER — LOSARTAN POTASSIUM 100 MG PO TABS: 100.0000 mg | ORAL_TABLET | Freq: Every day | ORAL | 3 refills | Status: DC

## 2020-11-30 MED ORDER — DRONEDARONE HCL 400 MG PO TABS: 400.0000 mg | ORAL_TABLET | Freq: Two times a day (BID) | ORAL | 3 refills | Status: DC

## 2020-11-30 MED ORDER — HYDRALAZINE HCL 100 MG PO TABS: 100.0000 mg | ORAL_TABLET | Freq: Two times a day (BID) | ORAL | 3 refills | Status: DC

## 2020-11-30 NOTE — Progress Notes (Signed)
Electrophysiology Office Note:    Date:  11/30/2020   ID:  Clinton Dorsey, DOB Feb 04, 1951, MRN [id number]  PCP:  [name], MD  [facility] Cardiologist:  [name], MD  [facility] Electrophysiologist:  None   Referring MD: [name], MD   Chief Complaint: Atrial fibrillation  History of Present Illness:    Clinton Dorsey is a 70 y.o. male who presents for an evaluation of atrial fibrillation at the request of Gillian Shields, NP. Their medical history includes coronary disease post NSTEMI in 2015 post PCI to the RPL, left circumflex and D1, diabetes, hypertension, hyperlipidemia.  The patient last saw Gillian Shields, NP on November 16, 2020.  His diagnosis of atrial fibrillation dates back to November 2020 when he was seen in the emergency department.  At that time he was started on diltiazem which converted him back to normal rhythm.  Eliquis was started at that time.  He was readmitted in December 2021 with atrial fibrillation with rapid ventricular rates.  He was scheduled for cardioversion on September 20, 2020 but converted back to sinus bradycardia prior to that appointment.  His diltiazem had to be down titrated because of his bradycardia.  When he saw [place] in clinic in February of this year he was back in normal rhythm.  Afterwards though on November 07, 2020 he was back in atrial fibrillation.  He required cardioversion on February 22 he was started on Multaq at that time and has thankfully not gone back in atrial fibrillation.  Today he is in clinic with his wife who is a Engineer, civil (consulting).  [name] desires a management strategy for his atrial fibrillation that avoid long-term medication use.  He tells me he is highly symptomatic when he is in his atrial fibrillation.  Past Medical History:  Diagnosis Date  . Arrhythmia   . Coronary artery disease    a. NSTEMI; b. 12/23/13 cath 11/2013: D1 80%, LCx 90% failed PCI, dRCA 99%, PL branch 99% s/p PCI/DES,  EF 55%; c.  staged cardiac cath 12/2013: s/p PCI/DES to both D1 and mLCx, resdial LM 40%, ostial LCx 50%  . Depression   . Depression   . Diabetes mellitus (HCC)    a. poorly controlled  . Hyperlipidemia   . Hypertension   . Hypokalemia    a. recurrent  . Kidney stones   . MI (myocardial infarction) (HCC) 11/2013  . Mitral regurgitation    a. echo 11/2013: EF 55-60%, mild LVH, mildly dilated LA, mild TR, mild to mod MR, mildly elevated RVSP    . Obesity   . Tobacco abuse     Past Surgical History:  Procedure Laterality Date  . CARDIAC CATHETERIZATION  12/08/2013  . CARDIOVERSION N/A 09/22/2020   Procedure: CARDIOVERSION;  Surgeon: Antonieta Iba, MD;  Location: [facility] ORS;  Service: Cardiovascular;  Laterality: N/A;  . CARDIOVERSION N/A 11/08/2020   Procedure: CARDIOVERSION;  Surgeon: Yvonne Kendall, MD;  Location: [facility] ORS;  Service: Cardiovascular;  Laterality: N/A;  . CORONARY ANGIOPLASTY  12/08/2013   s/p stent placement  . HERNIA REPAIR    . LAMINECTOMY    . PERCUTANEOUS CORONARY STENT INTERVENTION (PCI-S) N/A 12/23/2013   Procedure: PERCUTANEOUS CORONARY STENT INTERVENTION (PCI-S);  Surgeon: [name], MD;  Location: [facility] CATH LAB;  Service: Cardiovascular;  Laterality: N/A;  . SPINE SURGERY  2014  . TONSILLECTOMY      Current Medications: Current Meds  Medication Sig  . buPROPion (WELLBUTRIN XL) 150 MG 24 hr  tablet Take 1 tablet (150 mg total) by mouth daily.  . carvedilol (COREG) 25 MG tablet Take 0.5 tablets (12.5 mg total) by mouth 2 (two) times daily.  . cholecalciferol (VITAMIN D3) 25 MCG (1000 UNIT) tablet Take 1,000 Units by mouth daily.  . Coenzyme Q10 10 MG capsule Take 10 mg by mouth daily.   Marland Kitchen ELIQUIS 5 MG TABS tablet TAKE 1 TABLET(5 MG) BY MOUTH TWICE DAILY  . ezetimibe (ZETIA) 10 MG tablet TAKE 1 TABLET(10 MG) BY MOUTH DAILY  . furosemide (LASIX) 20 MG tablet Take 1 tablet (20 mg total) by mouth daily. Take 1 tablet for 3 days.  . Glucose Blood (BLOOD GLUCOSE TEST  STRIPS) STRP 1 strip by In Vitro route daily.  . hydrALAZINE (APRESOLINE) 100 MG tablet Take 1 tablet (100 mg total) by mouth 2 (two) times daily.  Marland Kitchen losartan (COZAAR) 100 MG tablet Take 1 tablet (100 mg total) by mouth daily.  . magnesium oxide (MAG-OX) 400 MG tablet Take 400 mg by mouth daily.  . metFORMIN (GLUCOPHAGE) 500 MG tablet Take 500 mg by mouth daily with supper.  . Multiple Vitamin (MULTIVITAMIN) capsule Take 1 capsule by mouth daily.  . potassium chloride SA (KLOR-CON) 20 MEQ tablet Take 1 tablet (20 mEq total) by mouth daily.  . rosuvastatin (CRESTOR) 20 MG tablet TAKE 1 TABLET(20 MG) BY MOUTH DAILY  . sildenafil (REVATIO) 20 MG tablet TAKE 1-5 TABLETS BY MOUTH DAILY AS NEEDED  . [DISCONTINUED] dronedarone (MULTAQ) 400 MG tablet Take 1 tablet (400 mg total) by mouth 2 (two) times daily with a meal.  . [DISCONTINUED] hydrALAZINE (APRESOLINE) 50 MG tablet Take 0.5 tablets (25 mg total) by mouth 3 (three) times daily.  . [DISCONTINUED] telmisartan (MICARDIS) 40 MG tablet Take 2 tablets (80 mg total) by mouth daily.     Allergies:   Spironolactone, Penicillins, Atorvastatin, and Simvastatin   Social History   Socioeconomic History  . Marital status: Married    Spouse name: Not on file  . Number of children: Not on file  . Years of education: Not on file  . Highest education level: Not on file  Occupational History  . Not on file  Tobacco Use  . Smoking status: Former Smoker    Types: Cigars    Quit date: 12/08/2013    Years since quitting: 6.9  . Smokeless tobacco: Never Used  Vaping Use  . Vaping Use: Not on file  Substance and Sexual Activity  . Alcohol use: No    Alcohol/week: 1.0 standard drink    Types: 1 Glasses of wine per week    Comment: occasional  . Drug use: No  . Sexual activity: Not on file  Other Topics Concern  . Not on file  Social History Narrative  . Not on file   Social Determinants of Health   Financial Resource Strain: Not on file  Food  Insecurity: Not on file  Transportation Needs: Not on file  Physical Activity: Not on file  Stress: Not on file  Social Connections: Not on file     Family History: The patient's family history includes Heart attack (age of onset: 45) in his father; Heart disease in his father; Hypertension in his mother and sister.  ROS:   Please see the history of present illness.    All other systems reviewed and are negative.  EKGs/Labs/Other Studies Reviewed:    The following studies were reviewed today:  September 07, 2020 echo personally reviewed Left ventricular function normal,  55% Right ventricular function normal Dilated left atrium Dilated right atrium Mild MR   EKG:  The ekg ordered today demonstrates sinus rhythm  Recent Labs: 11/07/2020: TSH 1.056 11/16/2020: ALT 44; Hemoglobin 13.9; Magnesium 2.5; Platelets 257 11/23/2020: B Natriuretic Peptide 408.1; BUN 27; Creatinine, Ser 1.06; Potassium 4.3; Sodium 138  Recent Lipid Panel    Component Value Date/Time   CHOL 114 09/29/2020 0920   CHOL 164 07/10/2016 1525   CHOL 182 12/07/2013 0956   TRIG 102 09/29/2020 0920   TRIG 108 07/10/2016 1525   TRIG 224 (H) 12/07/2013 0956   HDL 38 (L) 09/29/2020 0920   HDL 33 (L) 12/07/2013 0956   CHOLHDL 3.0 09/29/2020 0920   VLDL 22 07/10/2016 1525   VLDL 45 (H) 12/07/2013 0956   LDLCALC 57 09/29/2020 0920   LDLCALC 104 (H) 12/07/2013 0956    Physical Exam:    VS:  BP (!) 160/96   Pulse 64   Ht 5\' 6"  (1.676 m)   Wt 172 lb 3.2 oz (78.1 kg)   SpO2 98%   BMI 27.79 kg/m     Wt Readings from Last 3 Encounters:  11/30/20 172 lb 3.2 oz (78.1 kg)  11/16/20 170 lb (77.1 kg)  11/08/20 171 lb 15.3 oz (78 kg)     GEN:  Well nourished, well developed in no acute distress HEENT: Normal NECK: No JVD; No carotid bruits LYMPHATICS: No lymphadenopathy CARDIAC: RRR, no murmurs, rubs, gallops RESPIRATORY:  Clear to auscultation without rales, wheezing or rhonchi  ABDOMEN: Soft, non-tender,  non-distended MUSCULOSKELETAL:  No edema; No deformity  SKIN: Warm and dry NEUROLOGIC:  Alert and oriented x 3 PSYCHIATRIC:  Normal affect   ASSESSMENT:    1. Atrial fibrillation, unspecified type (HCC)   2. Coronary artery disease involving native coronary artery of native heart without angina pectoris   3. Essential hypertension   4. Paroxysmal atrial fibrillation (HCC)    PLAN:    In order of problems listed above:  1. Symptomatic persistent atrial fibrillation Highly symptomatic.  Has required cardioversion in the past multiple times.  Is on dronedarone right now.  Discussed management options for his atrial fibrillation including an alternative antiarrhythmic medication such as dofetilide or amiodarone.  I also discussed ablation with him and his wife.  After our discussion, the patient would like to pursue ablation therapy to help manage his atrial fibrillation.  He is most interested in pursuing a strategy to avoid long-term antiarrhythmic use which I think is very reasonable.  He is taking Eliquis for stroke prophylaxis and tolerating this medication well.  I discussed A. fib ablation in detail with the patient and his wife during today's visit, the associated risks, recovery time and efficacy.  They would like to proceed with scheduling.  Risk, benefits, and alternatives to EP study and radiofrequency ablation for afib were also discussed in detail today. These risks include but are not limited to stroke, bleeding, vascular damage, tamponade, perforation, damage to the esophagus, lungs, and other structures, pulmonary vein stenosis, worsening renal function, and death. The patient understands these risk and wishes to proceed.  We will therefore proceed with catheter ablation at the next available time.  Carto, ICE, anesthesia are requested for the procedure.  Will also obtain CT PV protocol prior to the procedure to exclude LAA thrombus and further evaluate atrial anatomy.  2.   Hypertension Above goal at today's visit.  The patient brings in home blood pressure monitoring's which are also above goal.  I would stop his telmisartan given he has had better control while on losartan.  He will restart his losartan at 100 mg by mouth daily.  We will also increase his hydralazine to 75 mg by mouth 3 times daily for 2 days.  If his blood pressures are still not controlled on 75 mg by mouth 3 times daily, he will increase to 100 mg by mouth 3 times daily.  3.  Coronary artery disease Post multiple PCI.  Continue rosuvastatin.   Medication Adjustments/Labs and Tests Ordered: Current medicines are reviewed at length with the patient today.  Concerns regarding medicines are outlined above.  Orders Placed This Encounter  Procedures  . CT CARDIAC MORPH/PULM VEIN W/CM&W/O CA SCORE  . Basic Metabolic Panel (BMET)  . CBC w/Diff  . EKG 12-Lead   Meds ordered this encounter  Medications  . hydrALAZINE (APRESOLINE) 100 MG tablet    Sig: Take 1 tablet (100 mg total) by mouth 2 (two) times daily.    Dispense:  180 tablet    Refill:  3  . losartan (COZAAR) 100 MG tablet    Sig: Take 1 tablet (100 mg total) by mouth daily.    Dispense:  90 tablet    Refill:  3  . dronedarone (MULTAQ) 400 MG tablet    Sig: Take 1 tablet (400 mg total) by mouth 2 (two) times daily with a meal.    Dispense:  180 tablet    Refill:  3     Signed, Steffanie Dunn, MD, [facility]  11/30/2020 10:04 PM    Electrophysiology [facility]

## 2020-11-30 NOTE — Patient Instructions (Addendum)
Medication Instructions:  Your physician has recommended you make the following change in your medication:   1.  CHANGE how you take your hydralazine-  A.  Take hydralazine 75 mg by mouth twice a day for 2 days  B.  Then increase hydralazine to 100 mg by mouth twice a day  2.  STOP telmisartan  3.  START taking losartan 100 mg-  One tablet by mouth daily  *If you need a refill on your cardiac medications before your next appointment, please call your pharmacy*  Lab Work: None ordered. If you have labs (blood work) drawn today and your tests are completely normal, you will receive your results only by: Marland Kitchen MyChart Message (if you have MyChart) OR . A paper copy in the mail If you have any lab test that is abnormal or we need to change your treatment, we will call you to review the results.  Testing/Procedures: None ordered.  Follow-Up: At [facility], you and your health needs are our priority.  As part of our continuing mission to provide you with exceptional heart care, we have created designated Provider Care Teams.  These Care Teams include your primary Cardiologist (physician) and Advanced Practice Providers (APPs -  Physician Assistants and Nurse Practitioners) who all work together to provide you with the care you need, when you need it.  Your next appointment:    SEE INSTRUCTION LETTER   Cardiac electrophysiology: From cell to bedside (7th ed., pp. 3295-1884). [place], [place]: Elsevier.">  Cardiac Ablation Cardiac ablation is a procedure to destroy, or ablate, a small amount of heart tissue in very specific places. The heart has many electrical connections. Sometimes these connections are abnormal and can cause the heart to beat very fast or irregularly. Ablating some of the areas that cause problems can improve the heart's rhythm or return it to normal. Ablation may be done for people who:  Have Wolff-Parkinson-White syndrome.  Have fast heart rhythms  (tachycardia).  Have taken medicines for an abnormal heart rhythm (arrhythmia) that were not effective or caused side effects.  Have a high-risk heartbeat that may be life-threatening. During the procedure, a small incision is made in the neck or the groin, and a long, thin tube (catheter) is inserted into the incision and moved to the heart. Small devices (electrodes) on the tip of the catheter will send out electrical currents. A type of X-ray (fluoroscopy) will be used to help guide the catheter and to provide images of the heart. Tell a health care provider about:  Any allergies you have.  All medicines you are taking, including vitamins, herbs, eye drops, creams, and over-the-counter medicines.  Any problems you or family members have had with anesthetic medicines.  Any blood disorders you have.  Any surgeries you have had.  Any medical conditions you have, such as kidney failure.  Whether you are pregnant or may be pregnant. What are the risks? Generally, this is a safe procedure. However, problems may occur, including:  Infection.  Bruising and bleeding at the catheter insertion site.  Bleeding into the chest, especially into the sac that surrounds the heart. This is a serious complication.  Stroke or blood clots.  Damage to nearby structures or organs.  Allergic reaction to medicines or dyes.  Need for a permanent pacemaker if the normal electrical system is damaged. A pacemaker is a small computer that sends electrical signals to the heart and helps your heart beat normally.  The procedure not being fully effective. This may  not be recognized until months later. Repeat ablation procedures are sometimes done. What happens before the procedure? Medicines Ask your health care provider about:  Changing or stopping your regular medicines. This is especially important if you are taking diabetes medicines or blood thinners.  Taking medicines such as aspirin and  ibuprofen. These medicines can thin your blood. Do not take these medicines unless your health care provider tells you to take them.  Taking over-the-counter medicines, vitamins, herbs, and supplements. General instructions  Follow instructions from your health care provider about eating or drinking restrictions.  Plan to have someone take you home from the hospital or clinic.  If you will be going home right after the procedure, plan to have someone with you for 24 hours.  Ask your health care provider what steps will be taken to prevent infection. What happens during the procedure?  An IV will be inserted into one of your veins.  You will be given a medicine to help you relax (sedative).  The skin on your neck or groin will be numbed.  An incision will be made in your neck or your groin.  A needle will be inserted through the incision and into a large vein in your neck or groin.  A catheter will be inserted into the needle and moved to your heart.  Dye may be injected through the catheter to help your surgeon see the area of the heart that needs treatment.  Electrical currents will be sent from the catheter to ablate heart tissue in desired areas. There are three types of energy that may be used to do this: ? Heat (radiofrequency energy). ? Laser energy. ? Extreme cold (cryoablation).  When the tissue has been ablated, the catheter will be removed.  Pressure will be held on the insertion area to prevent a lot of bleeding.  A bandage (dressing) will be placed over the insertion area. The exact procedure may vary among health care providers and hospitals.   What happens after the procedure?  Your blood pressure, heart rate, breathing rate, and blood oxygen level will be monitored until you leave the hospital or clinic.  Your insertion area will be monitored for bleeding. You will need to lie still for a few hours to ensure that you do not bleed from the insertion  area.  Do not drive for 24 hours or as long as told by your health care provider. Summary  Cardiac ablation is a procedure to destroy, or ablate, a small amount of heart tissue using an electrical current. This procedure can improve the heart rhythm or return it to normal.  Tell your health care provider about any medical conditions you may have and all medicines you are taking to treat them.  This is a safe procedure, but problems may occur. Problems may include infection, bruising, damage to nearby organs or structures, or allergic reactions to medicines.  Follow your health care provider's instructions about eating and drinking before the procedure. You may also be told to change or stop some of your medicines.  After the procedure, do not drive for 24 hours or as long as told by your health care provider. This information is not intended to replace advice given to you by your health care provider. Make sure you discuss any questions you have with your health care provider. Document Revised: 07/13/2019 Document Reviewed: 07/13/2019 Elsevier Patient Education  2021 [employer].

## 2020-12-12 DIAGNOSIS — I10 Essential (primary) hypertension: Secondary | ICD-10-CM | POA: Diagnosis not present

## 2020-12-12 DIAGNOSIS — I480 Paroxysmal atrial fibrillation: Secondary | ICD-10-CM | POA: Diagnosis not present

## 2020-12-12 DIAGNOSIS — I2510 Atherosclerotic heart disease of native coronary artery without angina pectoris: Secondary | ICD-10-CM | POA: Diagnosis not present

## 2020-12-12 DIAGNOSIS — E1159 Type 2 diabetes mellitus with other circulatory complications: Secondary | ICD-10-CM | POA: Diagnosis not present

## 2020-12-15 ENCOUNTER — Telehealth: Payer: Self-pay | Admitting: Cardiovascular Disease

## 2020-12-15 NOTE — Telephone Encounter (Signed)
Able to speak with pt regarding his BP, reports last few days his BP has been elevated despite medication change by Dr. Lalla Brothers on 3/16, below are current BP readings: 03/28 - 180/95 03/29 - 156/75 03/30 - 169/88 03/31 - 173/90 Per AVS, Dr. Lalla Brothers recommended  Hydralazine 100 mg BID  Losartan 100 daily  And to stop telmisartan.  Clinton Dorsey wanted to know if he needed to go back on telmisartan verse the losartan? He is currently not having any cardiac symptoms or dizziness, just concern for HTN. Did advise on taking BP for accurate reading, pt voiced understanding. Will await phone call for advice and recommendations. Message will be r/t Dr. Lalla Brothers.

## 2020-12-15 NOTE — Telephone Encounter (Signed)
Pt c/o BP issue: STAT if pt c/o blurred vision, one-sided weakness or slurred speech  1. What are your last 5 BP readings? 03/28 - 180/95 03/29 - 156/75 03/30 - 169/88 03/31 - 173/90  2. Are you having any other symptoms (ex. Dizziness, headache, blurred vision, passed out)? No   3. What is your BP issue? Patient calling, states that since the change to losartan 100 MG his BP has been fluctuating.  Please call to discuss.

## 2020-12-16 DIAGNOSIS — E119 Type 2 diabetes mellitus without complications: Secondary | ICD-10-CM | POA: Diagnosis not present

## 2020-12-16 MED ORDER — HYDRALAZINE HCL 100 MG PO TABS: ORAL_TABLET | ORAL | 3 refills | Status: DC

## 2020-12-16 NOTE — Telephone Encounter (Signed)
See mychart thread.  Pt will add hydralazine 50 mg at lunch time and continue to monitor blood pressures.

## 2020-12-16 NOTE — Telephone Encounter (Signed)
At clinic visit 11/2020 Dr. Lalla Brothers transitioned him from Telmisartan back to Losartan as the patient felt he had better control on Losartan.   Please ensure BP readings are at least 1-2 hours after morning medications. If BP readings not after medications - recommend he check over the weekend and send [place] an update on Monday.  If BP readings are after medications - recommend addition of Hydralazine 50mg  at lunch time.  If that change does not offer adequate BP control after a few days, we would plan to increase to Hydralazine 100mg  TID.  , NP

## 2020-12-16 NOTE — Addendum Note (Signed)
Addended by: Roney Mans A on: 12/16/2020 08:37 AM   Modules accepted: Orders

## 2020-12-29 ENCOUNTER — Telehealth: Payer: Self-pay | Admitting: Family

## 2020-12-29 ENCOUNTER — Other Ambulatory Visit: Payer: Self-pay

## 2020-12-29 ENCOUNTER — Other Ambulatory Visit: Payer: Self-pay | Admitting: Cardiovascular Disease

## 2020-12-29 MED ORDER — HYDRALAZINE HCL 100 MG PO TABS
100.0000 mg | ORAL_TABLET | Freq: Three times a day (TID) | ORAL | 3 refills | Status: DC
Start: 2020-12-29 — End: 2021-11-07

## 2020-12-29 MED ORDER — HYDRALAZINE HCL 100 MG PO TABS: 100.0000 mg | ORAL_TABLET | Freq: Three times a day (TID) | ORAL | 3 refills | Status: DC

## 2020-12-29 NOTE — Telephone Encounter (Signed)
Rx request sent to pharmacy.  

## 2020-12-29 NOTE — Telephone Encounter (Signed)
Spoke to pharmacy tech at [facility]. Confirmed correct Rx for pt: Hydralazine 100mg  take three times daily. Asked to remove original Rx they received.  Med list updated.  Will notify pt via MyChart to ensure he understands to take Hydralazine 100mg  TID.    See telephone encounter 12/15/20, per , NP:  Recommend transition to Hydralazine 100mg  three times per day (presently only taking 50mg  at lunch time). Be sure to check BP at least 1-2 hours after medications and after sitting quietly for 10 minutes.   Please have Clinton Dorsey make this change and send 12/17/20 a log of blood pressure readings Monday so we can reassess the response.   Thank you!

## 2020-12-29 NOTE — Telephone Encounter (Signed)
Communication thread with pt on MyChart between [place] and [place] office regarding pt's BP, there has been several meds changes. Latest changes reflect Gillian Shields, NP response  "Recommend transition to Hydralazine 100mg  three times per day (presently only taking 50mg  at lunch time). Be sure to check BP at least 1-2 hours after medications and after sitting quietly for 10 minutes.   Please have Mr. Hickle make this change and send a log of blood pressure readings Monday so we can reassess the response."  Addition of his hydralazine was updated for pharmacy so pt will have enough medication from his original script. Mychart message sent back to pt with recommendations.

## 2020-12-29 NOTE — Telephone Encounter (Signed)
Pt c/o medication issue:  1. Name of Medication: Hydralazine   2. How are you currently taking this medication (dosage and times per day)? Please clarify   3. Are you having a reaction (difficulty breathing--STAT)? no  4. What is your medication issue? Pharmacy calling to clarify dose

## 2021-01-10 ENCOUNTER — Telehealth (HOSPITAL_COMMUNITY): Payer: Self-pay | Admitting: *Deleted

## 2021-01-10 NOTE — Telephone Encounter (Signed)
Attempted to call patient regarding upcoming cardiac CT appointment. °Left message on voicemail with name and callback number ° °Cesily Cuoco RN Navigator Cardiac Imaging °[facility] °[phone] Office °[phone] Cell ° °

## 2021-01-11 ENCOUNTER — Other Ambulatory Visit
Admission: RE | Admit: 2021-01-11 | Discharge: 2021-01-11 | Disposition: A | Discharge status: Home / Self Care | Payer: BC Managed Care – PPO | Attending: Cardiology | Admitting: Cardiology

## 2021-01-11 DIAGNOSIS — I4819 Other persistent atrial fibrillation: Secondary | ICD-10-CM | POA: Diagnosis not present

## 2021-01-11 DIAGNOSIS — I4891 Unspecified atrial fibrillation: Secondary | ICD-10-CM | POA: Insufficient documentation

## 2021-01-11 DIAGNOSIS — I2510 Atherosclerotic heart disease of native coronary artery without angina pectoris: Secondary | ICD-10-CM | POA: Insufficient documentation

## 2021-01-11 DIAGNOSIS — I10 Essential (primary) hypertension: Secondary | ICD-10-CM | POA: Diagnosis not present

## 2021-01-11 LAB — CBC WITH DIFFERENTIAL/PLATELET
Abs Immature Granulocytes: 0.02 10*3/uL (ref 0.00–0.07)
Basophils Absolute: 0.1 10*3/uL (ref 0.0–0.1)
Basophils Relative: 1 %
Eosinophils Absolute: 0.2 10*3/uL (ref 0.0–0.5)
Eosinophils Relative: 3 %
HCT: 38.7 % — ABNORMAL LOW (ref 39.0–52.0)
Hemoglobin: 13.1 g/dL (ref 13.0–17.0)
Immature Granulocytes: 0 %
Lymphocytes Relative: 17 %
Lymphs Abs: 1.1 10*3/uL (ref 0.7–4.0)
MCH: 29.8 pg (ref 26.0–34.0)
MCHC: 33.9 g/dL (ref 30.0–36.0)
MCV: 88.2 fL (ref 80.0–100.0)
Monocytes Absolute: 0.7 10*3/uL (ref 0.1–1.0)
Monocytes Relative: 11 %
Neutro Abs: 4.3 10*3/uL (ref 1.7–7.7)
Neutrophils Relative %: 68 %
Platelets: 229 10*3/uL (ref 150–400)
RBC: 4.39 MIL/uL (ref 4.22–5.81)
RDW: 13.2 % (ref 11.5–15.5)
WBC: 6.4 10*3/uL (ref 4.0–10.5)
nRBC: 0 % (ref 0.0–0.2)

## 2021-01-11 LAB — BASIC METABOLIC PANEL
Anion gap: 10 (ref 5–15)
BUN: 28 mg/dL — ABNORMAL HIGH (ref 8–23)
CO2: 25 mmol/L (ref 22–32)
Calcium: 9 mg/dL (ref 8.9–10.3)
Chloride: 103 mmol/L (ref 98–111)
Creatinine, Ser: 1.1 mg/dL (ref 0.61–1.24)
GFR, Estimated: >60 mL/min (ref >60)
Glucose, Bld: 121 mg/dL — ABNORMAL HIGH (ref 70–99)
Potassium: 3.6 mmol/L (ref 3.5–5.1)
Sodium: 138 mmol/L (ref 135–145)

## 2021-01-12 ENCOUNTER — Ambulatory Visit
Admission: RE | Admit: 2021-01-12 | Discharge: 2021-01-12 | Disposition: A | Discharge status: Home / Self Care | Payer: BC Managed Care – PPO | Source: Ambulatory Visit | Attending: Cardiology | Admitting: Cardiology

## 2021-01-12 ENCOUNTER — Other Ambulatory Visit: Payer: Self-pay

## 2021-01-12 DIAGNOSIS — I4819 Other persistent atrial fibrillation: Secondary | ICD-10-CM | POA: Insufficient documentation

## 2021-01-12 MED ORDER — IOHEXOL 350 MG/ML SOLN
75.0000 mL | Freq: Once | INTRAVENOUS | Status: AC | PRN
  Administered 2021-01-12: 75 mL via INTRAVENOUS

## 2021-01-13 ENCOUNTER — Other Ambulatory Visit
Admission: RE | Admit: 2021-01-13 | Discharge: 2021-01-13 | Disposition: A | Discharge status: Home / Self Care | Payer: BC Managed Care – PPO | Source: Ambulatory Visit | Attending: Cardiology | Admitting: Cardiology

## 2021-01-13 DIAGNOSIS — Z20822 Contact with and (suspected) exposure to covid-19: Secondary | ICD-10-CM | POA: Insufficient documentation

## 2021-01-13 DIAGNOSIS — Z01812 Encounter for preprocedural laboratory examination: Secondary | ICD-10-CM | POA: Insufficient documentation

## 2021-01-13 LAB — SARS CORONAVIRUS 2 (TAT 6-24 HRS): SARS Coronavirus 2: NEGATIVE

## 2021-01-15 DIAGNOSIS — E119 Type 2 diabetes mellitus without complications: Secondary | ICD-10-CM | POA: Diagnosis not present

## 2021-01-16 ENCOUNTER — Encounter (HOSPITAL_COMMUNITY)
Admission: RE | Disposition: A | Discharge status: Home / Self Care | Payer: Self-pay | Source: Home / Self Care | Attending: Cardiology

## 2021-01-16 ENCOUNTER — Ambulatory Visit (HOSPITAL_COMMUNITY)
Admission: RE | Admit: 2021-01-16 | Discharge: 2021-01-16 | Disposition: A | Discharge status: Home / Self Care | Payer: BC Managed Care – PPO | Attending: Cardiology | Admitting: Cardiology

## 2021-01-16 ENCOUNTER — Ambulatory Visit (HOSPITAL_COMMUNITY): Payer: BC Managed Care – PPO | Admitting: Certified Registered Nurse Anesthetist

## 2021-01-16 ENCOUNTER — Other Ambulatory Visit: Payer: Self-pay

## 2021-01-16 DIAGNOSIS — Z7901 Long term (current) use of anticoagulants: Secondary | ICD-10-CM | POA: Insufficient documentation

## 2021-01-16 DIAGNOSIS — Z7984 Long term (current) use of oral hypoglycemic drugs: Secondary | ICD-10-CM | POA: Insufficient documentation

## 2021-01-16 DIAGNOSIS — I2510 Atherosclerotic heart disease of native coronary artery without angina pectoris: Secondary | ICD-10-CM | POA: Diagnosis not present

## 2021-01-16 DIAGNOSIS — E876 Hypokalemia: Secondary | ICD-10-CM | POA: Diagnosis not present

## 2021-01-16 DIAGNOSIS — I10 Essential (primary) hypertension: Secondary | ICD-10-CM | POA: Insufficient documentation

## 2021-01-16 DIAGNOSIS — I4819 Other persistent atrial fibrillation: Secondary | ICD-10-CM | POA: Insufficient documentation

## 2021-01-16 DIAGNOSIS — Z880 Allergy status to penicillin: Secondary | ICD-10-CM | POA: Insufficient documentation

## 2021-01-16 DIAGNOSIS — Z87891 Personal history of nicotine dependence: Secondary | ICD-10-CM | POA: Diagnosis not present

## 2021-01-16 DIAGNOSIS — I4891 Unspecified atrial fibrillation: Secondary | ICD-10-CM | POA: Diagnosis not present

## 2021-01-16 DIAGNOSIS — Z79899 Other long term (current) drug therapy: Secondary | ICD-10-CM | POA: Diagnosis not present

## 2021-01-16 DIAGNOSIS — E785 Hyperlipidemia, unspecified: Secondary | ICD-10-CM | POA: Diagnosis not present

## 2021-01-16 DIAGNOSIS — M48061 Spinal stenosis, lumbar region without neurogenic claudication: Secondary | ICD-10-CM | POA: Diagnosis not present

## 2021-01-16 HISTORY — PX: ATRIAL FIBRILLATION ABLATION: EP1191

## 2021-01-16 LAB — POCT ACTIVATED CLOTTING TIME
Activated Clotting Time: 309 seconds
Activated Clotting Time: 321 seconds
Activated Clotting Time: 339 seconds

## 2021-01-16 LAB — GLUCOSE, CAPILLARY
Glucose-Capillary: 120 mg/dL — ABNORMAL HIGH (ref 70–99)
Glucose-Capillary: 130 mg/dL — ABNORMAL HIGH (ref 70–99)

## 2021-01-16 SURGERY — ATRIAL FIBRILLATION ABLATION
Anesthesia: General

## 2021-01-16 MED ORDER — LIDOCAINE 2% (20 MG/ML) 5 ML SYRINGE
INTRAMUSCULAR | Status: DC | PRN
  Administered 2021-01-16: 20 mg via INTRAVENOUS

## 2021-01-16 MED ORDER — ISOPROTERENOL HCL 0.2 MG/ML IJ SOLN
INTRAVENOUS | Status: DC | PRN
  Administered 2021-01-16: 4 ug/min via INTRAVENOUS

## 2021-01-16 MED ORDER — DEXAMETHASONE SODIUM PHOSPHATE 10 MG/ML IJ SOLN
INTRAMUSCULAR | Status: DC | PRN
  Administered 2021-01-16: 5 mg via INTRAVENOUS

## 2021-01-16 MED ORDER — HEPARIN (PORCINE) IN NACL 1000-0.9 UT/500ML-% IV SOLN
INTRAVENOUS | Status: AC
  Filled 2021-01-16: qty 500

## 2021-01-16 MED ORDER — COLCHICINE 0.6 MG PO TABS
0.6000 mg | ORAL_TABLET | Freq: Two times a day (BID) | ORAL | Status: DC
  Administered 2021-01-16: 0.6 mg via ORAL
  Filled 2021-01-16: qty 1

## 2021-01-16 MED ORDER — PANTOPRAZOLE SODIUM 40 MG PO TBEC
40.0000 mg | DELAYED_RELEASE_TABLET | Freq: Every day | ORAL | Status: DC
  Administered 2021-01-16: 40 mg via ORAL
  Filled 2021-01-16: qty 1

## 2021-01-16 MED ORDER — ONDANSETRON HCL 4 MG/2ML IJ SOLN: 4.0000 mg | Freq: Four times a day (QID) | INTRAMUSCULAR | Status: DC | PRN

## 2021-01-16 MED ORDER — PROTAMINE SULFATE 10 MG/ML IV SOLN
INTRAVENOUS | Status: DC | PRN
  Administered 2021-01-16: 30 mg via INTRAVENOUS

## 2021-01-16 MED ORDER — PANTOPRAZOLE SODIUM 40 MG PO TBEC: 40.0000 mg | DELAYED_RELEASE_TABLET | Freq: Every day | ORAL | 0 refills | Status: DC

## 2021-01-16 MED ORDER — ALBUTEROL SULFATE HFA 108 (90 BASE) MCG/ACT IN AERS
INHALATION_SPRAY | RESPIRATORY_TRACT | Status: DC | PRN
  Administered 2021-01-16: 4 via RESPIRATORY_TRACT

## 2021-01-16 MED ORDER — ACETAMINOPHEN 325 MG PO TABS: 650.0000 mg | ORAL_TABLET | ORAL | Status: DC | PRN

## 2021-01-16 MED ORDER — PROPOFOL 10 MG/ML IV BOLUS
INTRAVENOUS | Status: DC | PRN
  Administered 2021-01-16: 150 mg via INTRAVENOUS
  Administered 2021-01-16: 50 mg via INTRAVENOUS
  Administered 2021-01-16: 30 mg via INTRAVENOUS

## 2021-01-16 MED ORDER — PHENYLEPHRINE HCL-NACL 10-0.9 MG/250ML-% IV SOLN
INTRAVENOUS | Status: DC | PRN
  Administered 2021-01-16: 25 ug/min via INTRAVENOUS

## 2021-01-16 MED ORDER — SODIUM CHLORIDE 0.9% FLUSH: 3.0000 mL | INTRAVENOUS | Status: DC | PRN

## 2021-01-16 MED ORDER — ROCURONIUM BROMIDE 10 MG/ML (PF) SYRINGE
PREFILLED_SYRINGE | INTRAVENOUS | Status: DC | PRN
  Administered 2021-01-16: 60 mg via INTRAVENOUS

## 2021-01-16 MED ORDER — SODIUM CHLORIDE 0.9% FLUSH: 3.0000 mL | Freq: Two times a day (BID) | INTRAVENOUS | Status: DC

## 2021-01-16 MED ORDER — HEPARIN (PORCINE) IN NACL 1000-0.9 UT/500ML-% IV SOLN
INTRAVENOUS | Status: DC | PRN
  Administered 2021-01-16 (×4): 500 mL

## 2021-01-16 MED ORDER — SODIUM CHLORIDE 0.9 % IV SOLN: INTRAVENOUS | Status: DC

## 2021-01-16 MED ORDER — APIXABAN 5 MG PO TABS
5.0000 mg | ORAL_TABLET | Freq: Two times a day (BID) | ORAL | Status: DC
  Administered 2021-01-16: 5 mg via ORAL
  Filled 2021-01-16: qty 1

## 2021-01-16 MED ORDER — ISOPROTERENOL HCL 0.2 MG/ML IJ SOLN
INTRAMUSCULAR | Status: AC
  Filled 2021-01-16: qty 5

## 2021-01-16 MED ORDER — HEPARIN SODIUM (PORCINE) 1000 UNIT/ML IJ SOLN
INTRAMUSCULAR | Status: DC | PRN
  Administered 2021-01-16: 12000 [IU] via INTRAVENOUS
  Administered 2021-01-16: 3000 [IU] via INTRAVENOUS
  Administered 2021-01-16: 2000 [IU] via INTRAVENOUS

## 2021-01-16 MED ORDER — HEPARIN SODIUM (PORCINE) 1000 UNIT/ML IJ SOLN
INTRAMUSCULAR | Status: AC
  Filled 2021-01-16: qty 1

## 2021-01-16 MED ORDER — HEPARIN SODIUM (PORCINE) 1000 UNIT/ML IJ SOLN
INTRAMUSCULAR | Status: DC | PRN
  Administered 2021-01-16: 1000 [IU] via INTRAVENOUS

## 2021-01-16 MED ORDER — FENTANYL CITRATE (PF) 100 MCG/2ML IJ SOLN
INTRAMUSCULAR | Status: DC | PRN
  Administered 2021-01-16: 100 ug via INTRAVENOUS

## 2021-01-16 MED ORDER — SODIUM CHLORIDE 0.9 % IV SOLN: 250.0000 mL | INTRAVENOUS | Status: DC | PRN

## 2021-01-16 MED ORDER — MIDAZOLAM HCL 2 MG/2ML IJ SOLN
INTRAMUSCULAR | Status: DC | PRN
  Administered 2021-01-16 (×2): 1 mg via INTRAVENOUS

## 2021-01-16 MED ORDER — COLCHICINE 0.6 MG PO TABS: 0.3000 mg | ORAL_TABLET | Freq: Two times a day (BID) | ORAL | 0 refills | Status: DC

## 2021-01-16 MED ORDER — SUGAMMADEX SODIUM 200 MG/2ML IV SOLN
INTRAVENOUS | Status: DC | PRN
  Administered 2021-01-16: 200 mg via INTRAVENOUS

## 2021-01-16 MED ORDER — ONDANSETRON HCL 4 MG/2ML IJ SOLN
INTRAMUSCULAR | Status: DC | PRN
  Administered 2021-01-16: 4 mg via INTRAVENOUS

## 2021-01-16 SURGICAL SUPPLY — 20 items
BLANKET WARM UNDERBOD FULL ACC (MISCELLANEOUS) ×2 IMPLANT
CATH MAPPNG PENTARAY F 2-6-2MM (CATHETERS) ×1 IMPLANT
CATH S CIRCA THERM PROBE 10F (CATHETERS) ×2 IMPLANT
CATH SMTCH THERMOCOOL SF DF (CATHETERS) ×2 IMPLANT
CATH SOUNDSTAR ECO 8FR (CATHETERS) ×2 IMPLANT
CATH WEBSTER BI DIR CS D-F CRV (CATHETERS) ×2 IMPLANT
CLOSURE PERCLOSE PROSTYLE (VASCULAR PRODUCTS) ×6 IMPLANT
COVER SWIFTLINK CONNECTOR (BAG) ×2 IMPLANT
MAT PREVALON FULL STRYKER (MISCELLANEOUS) ×2 IMPLANT
PACK EP LATEX FREE (CUSTOM PROCEDURE TRAY) ×1
PACK EP LF (CUSTOM PROCEDURE TRAY) ×1 IMPLANT
PAD PRO RADIOLUCENT 2001M-C (PAD) ×2 IMPLANT
PATCH CARTO3 (PAD) ×2 IMPLANT
PENTARAY F 2-6-2MM (CATHETERS) ×2
SHEATH BAYLIS TRANSSEPTAL 98CM (NEEDLE) ×2 IMPLANT
SHEATH CARTO VIZIGO SM CVD (SHEATH) ×2 IMPLANT
SHEATH PINNACLE 8F 10CM (SHEATH) ×4 IMPLANT
SHEATH PINNACLE VASC 9FR (SHEATH) ×2 IMPLANT
SHEATH PROBE COVER 6X72 (BAG) ×2 IMPLANT
TUBING SMART ABLATE COOLFLOW (TUBING) ×2 IMPLANT

## 2021-01-16 NOTE — H&P (Signed)
Electrophysiology Office Note:    Date:  11/30/2020   ID:  Lacinda Axon, DOB 21-Sep-1950, MRN [id number]  PCP:  [name], MD  [facility] Cardiologist:  [name], MD  [facility] Electrophysiologist:  None   Referring MD: [name], MD   Chief Complaint: Atrial fibrillation  History of Present Illness:    Clinton Dorsey is a 70 y.o. male who presents for an evaluation of atrial fibrillation at the request of Gillian Shields, NP. Their medical history includes coronary disease post NSTEMI in 2015 post PCI to the RPL, left circumflex and D1, diabetes, hypertension, hyperlipidemia.  The patient last saw Gillian Shields, NP on November 16, 2020.  His diagnosis of atrial fibrillation dates back to November 2020 when he was seen in the emergency department.  At that time he was started on diltiazem which converted him back to normal rhythm.  Eliquis was started at that time.  He was readmitted in December 2021 with atrial fibrillation with rapid ventricular rates.  He was scheduled for cardioversion on September 20, 2020 but converted back to sinus bradycardia prior to that appointment.  His diltiazem had to be down titrated because of his bradycardia.  When he saw [place] in clinic in February of this year he was back in normal rhythm.  Afterwards though on November 07, 2020 he was back in atrial fibrillation.  He required cardioversion on February 22 he was started on Multaq at that time and has thankfully not gone back in atrial fibrillation.  Today he is in clinic with his wife who is a Engineer, civil (consulting).  Clinton Dorsey desires a management strategy for his atrial fibrillation that avoid long-term medication use.  He tells me he is highly symptomatic when he is in his atrial fibrillation.      Past Medical History:  Diagnosis Date  . Arrhythmia   . Coronary artery disease    a. NSTEMI; b. 12/23/13 cath 11/2013: D1 80%, LCx 90% failed PCI, dRCA 99%, PL branch 99% s/p PCI/DES,   EF 55%; c. staged cardiac cath 12/2013: s/p PCI/DES to both D1 and mLCx, resdial LM 40%, ostial LCx 50%  . Depression   . Depression   . Diabetes mellitus (HCC)    a. poorly controlled  . Hyperlipidemia   . Hypertension   . Hypokalemia    a. recurrent  . Kidney stones   . MI (myocardial infarction) (HCC) 11/2013  . Mitral regurgitation    a. echo 11/2013: EF 55-60%, mild LVH, mildly dilated LA, mild TR, mild to mod MR, mildly elevated RVSP    . Obesity   . Tobacco abuse          Past Surgical History:  Procedure Laterality Date  . CARDIAC CATHETERIZATION  12/08/2013  . CARDIOVERSION N/A 09/22/2020   Procedure: CARDIOVERSION;  Surgeon: Antonieta Iba, MD;  Location: [facility] ORS;  Service: Cardiovascular;  Laterality: N/A;  . CARDIOVERSION N/A 11/08/2020   Procedure: CARDIOVERSION;  Surgeon: Yvonne Kendall, MD;  Location: [facility] ORS;  Service: Cardiovascular;  Laterality: N/A;  . CORONARY ANGIOPLASTY  12/08/2013   s/p stent placement  . HERNIA REPAIR    . LAMINECTOMY    . PERCUTANEOUS CORONARY STENT INTERVENTION (PCI-S) N/A 12/23/2013   Procedure: PERCUTANEOUS CORONARY STENT INTERVENTION (PCI-S);  Surgeon: [name], MD;  Location: [facility] CATH LAB;  Service: Cardiovascular;  Laterality: N/A;  . SPINE SURGERY  2014  . TONSILLECTOMY      Current Medications: Active Medications  Current Meds  Medication Sig  . buPROPion (WELLBUTRIN XL) 150 MG 24 hr tablet Take 1 tablet (150 mg total) by mouth daily.  . carvedilol (COREG) 25 MG tablet Take 0.5 tablets (12.5 mg total) by mouth 2 (two) times daily.  . cholecalciferol (VITAMIN D3) 25 MCG (1000 UNIT) tablet Take 1,000 Units by mouth daily.  . Coenzyme Q10 10 MG capsule Take 10 mg by mouth daily.   Marland Kitchen ELIQUIS 5 MG TABS tablet TAKE 1 TABLET(5 MG) BY MOUTH TWICE DAILY  . ezetimibe (ZETIA) 10 MG tablet TAKE 1 TABLET(10 MG) BY MOUTH DAILY  . furosemide (LASIX) 20 MG tablet Take 1 tablet (20 mg total) by  mouth daily. Take 1 tablet for 3 days.  . Glucose Blood (BLOOD GLUCOSE TEST STRIPS) STRP 1 strip by In Vitro route daily.  . hydrALAZINE (APRESOLINE) 100 MG tablet Take 1 tablet (100 mg total) by mouth 2 (two) times daily.  Marland Kitchen losartan (COZAAR) 100 MG tablet Take 1 tablet (100 mg total) by mouth daily.  . magnesium oxide (MAG-OX) 400 MG tablet Take 400 mg by mouth daily.  . metFORMIN (GLUCOPHAGE) 500 MG tablet Take 500 mg by mouth daily with supper.  . Multiple Vitamin (MULTIVITAMIN) capsule Take 1 capsule by mouth daily.  . potassium chloride SA (KLOR-CON) 20 MEQ tablet Take 1 tablet (20 mEq total) by mouth daily.  . rosuvastatin (CRESTOR) 20 MG tablet TAKE 1 TABLET(20 MG) BY MOUTH DAILY  . sildenafil (REVATIO) 20 MG tablet TAKE 1-5 TABLETS BY MOUTH DAILY AS NEEDED  . [DISCONTINUED] dronedarone (MULTAQ) 400 MG tablet Take 1 tablet (400 mg total) by mouth 2 (two) times daily with a meal.  . [DISCONTINUED] hydrALAZINE (APRESOLINE) 50 MG tablet Take 0.5 tablets (25 mg total) by mouth 3 (three) times daily.  . [DISCONTINUED] telmisartan (MICARDIS) 40 MG tablet Take 2 tablets (80 mg total) by mouth daily.       Allergies:   Spironolactone, Penicillins, Atorvastatin, and Simvastatin   Social History        Socioeconomic History  . Marital status: Married    Spouse name: Not on file  . Number of children: Not on file  . Years of education: Not on file  . Highest education level: Not on file  Occupational History  . Not on file  Tobacco Use  . Smoking status: Former Smoker    Types: Cigars    Quit date: 12/08/2013    Years since quitting: 6.9  . Smokeless tobacco: Never Used  Vaping Use  . Vaping Use: Not on file  Substance and Sexual Activity  . Alcohol use: No    Alcohol/week: 1.0 standard drink    Types: 1 Glasses of wine per week    Comment: occasional  . Drug use: No  . Sexual activity: Not on file  Other Topics Concern  . Not on file  Social History  Narrative  . Not on file   Social Determinants of Health   Financial Resource Strain: Not on file  Food Insecurity: Not on file  Transportation Needs: Not on file  Physical Activity: Not on file  Stress: Not on file  Social Connections: Not on file     Family History: The patient's family history includes Heart attack (age of onset: 51) in his father; Heart disease in his father; Hypertension in his mother and sister.  ROS:   Please see the history of present illness.    All other systems reviewed and are negative.  EKGs/Labs/Other Studies  Reviewed:    The following studies were reviewed today:  September 07, 2020 echo personally reviewed Left ventricular function normal, 55% Right ventricular function normal Dilated left atrium Dilated right atrium Mild MR   EKG:  The ekg ordered today demonstrates sinus rhythm  Recent Labs: 11/07/2020: TSH 1.056 11/16/2020: ALT 44; Hemoglobin 13.9; Magnesium 2.5; Platelets 257 11/23/2020: B Natriuretic Peptide 408.1; BUN 27; Creatinine, Ser 1.06; Potassium 4.3; Sodium 138  Recent Lipid Panel Labs (Brief)          Component Value Date/Time   CHOL 114 09/29/2020 0920   CHOL 164 07/10/2016 1525   CHOL 182 12/07/2013 0956   TRIG 102 09/29/2020 0920   TRIG 108 07/10/2016 1525   TRIG 224 (H) 12/07/2013 0956   HDL 38 (L) 09/29/2020 0920   HDL 33 (L) 12/07/2013 0956   CHOLHDL 3.0 09/29/2020 0920   VLDL 22 07/10/2016 1525   VLDL 45 (H) 12/07/2013 0956   LDLCALC 57 09/29/2020 0920   LDLCALC 104 (H) 12/07/2013 0956      Physical Exam:    VS:  BP (!) 160/96   Pulse 64   Ht 5\' 6"  (1.676 m)   Wt 172 lb 3.2 oz (78.1 kg)   SpO2 98%   BMI 27.79 kg/m        Wt Readings from Last 3 Encounters:  11/30/20 172 lb 3.2 oz (78.1 kg)  11/16/20 170 lb (77.1 kg)  11/08/20 171 lb 15.3 oz (78 kg)     GEN:  Well nourished, well developed in no acute distress HEENT: Normal NECK: No JVD; No carotid  bruits LYMPHATICS: No lymphadenopathy CARDIAC: RRR, no murmurs, rubs, gallops RESPIRATORY:  Clear to auscultation without rales, wheezing or rhonchi  ABDOMEN: Soft, non-tender, non-distended MUSCULOSKELETAL:  No edema; No deformity  SKIN: Warm and dry NEUROLOGIC:  Alert and oriented x 3 PSYCHIATRIC:  Normal affect   ASSESSMENT:    1. Atrial fibrillation, unspecified type (HCC)   2. Coronary artery disease involving native coronary artery of native heart without angina pectoris   3. Essential hypertension   4. Paroxysmal atrial fibrillation (HCC)    PLAN:    In order of problems listed above:  1. Symptomatic persistent atrial fibrillation Highly symptomatic.  Has required cardioversion in the past multiple times.  Is on dronedarone right now.  Discussed management options for his atrial fibrillation including an alternative antiarrhythmic medication such as dofetilide or amiodarone.  I also discussed ablation with him and his wife.  After our discussion, the patient would like to pursue ablation therapy to help manage his atrial fibrillation.  He is most interested in pursuing a strategy to avoid long-term antiarrhythmic use which I think is very reasonable.  He is taking Eliquis for stroke prophylaxis and tolerating this medication well.  I discussed A. fib ablation in detail with the patient and his wife during today's visit, the associated risks, recovery time and efficacy.  They would like to proceed with scheduling.  Risk, benefits, and alternatives to EP study and radiofrequency ablation for afib were also discussed in detail today. These risks include but are not limited to stroke, bleeding, vascular damage, tamponade, perforation, damage to the esophagus, lungs, and other structures, pulmonary vein stenosis, worsening renal function, and death. The patient understands these risk and wishes to proceed.  We will therefore proceed with catheter ablation at the next available  time.  Carto, ICE, anesthesia are requested for the procedure.  Will also obtain CT PV protocol prior to  the procedure to exclude LAA thrombus and further evaluate atrial anatomy.  2.  Hypertension Above goal at today's visit.  The patient brings in home blood pressure monitoring's which are also above goal.  I would stop his telmisartan given he has had better control while on losartan.  He will restart his losartan at 100 mg by mouth daily.  We will also increase his hydralazine to 75 mg by mouth 3 times daily for 2 days.  If his blood pressures are still not controlled on 75 mg by mouth 3 times daily, he will increase to 100 mg by mouth 3 times daily.  3.  Coronary artery disease Post multiple PCI.  Continue rosuvastatin.   ---------------------------------------------------------------------------  I have seen, examined the patient, and reviewed the above assessment and plan.    Plan for PVI today.   Lanier Prude, MD 01/16/2021 8:51 AM

## 2021-01-16 NOTE — Anesthesia Postprocedure Evaluation (Signed)
Anesthesia Post Note  Patient: Clinton Dorsey  Procedure(s) Performed: ATRIAL FIBRILLATION ABLATION (N/A )     Patient location during evaluation: PACU Anesthesia Type: General Level of consciousness: awake and alert Pain management: pain level controlled Vital Signs Assessment: post-procedure vital signs reviewed and stable Respiratory status: spontaneous breathing, nonlabored ventilation, respiratory function stable and patient connected to nasal cannula oxygen Cardiovascular status: blood pressure returned to baseline and stable Postop Assessment: no apparent nausea or vomiting Anesthetic complications: no   No complications documented.  Last Vitals:  Vitals:   01/16/21 1230 01/16/21 1235  BP: (!) 148/85 (!) 155/93  Pulse: 75 77  Resp: (!) 23 13  Temp:    SpO2: 98% 93%    Last Pain:  Vitals:   01/16/21 1202  TempSrc:   PainSc: 0-No pain                 Shasha Buchbinder COKER

## 2021-01-16 NOTE — Anesthesia Preprocedure Evaluation (Signed)
Anesthesia Evaluation  Patient identified by MRN, date of birth, ID band Patient awake    Reviewed: Allergy & Precautions, NPO status , Patient's Chart, lab work & pertinent test results  Airway Mallampati: II  TM Distance: >3 FB Neck ROM: Full    Dental  (+) Teeth Intact, Dental Advisory Given   Pulmonary former smoker,    breath sounds clear to auscultation       Cardiovascular hypertension,  Rhythm:Regular Rate:Normal     Neuro/Psych    GI/Hepatic   Endo/Other  diabetes  Renal/GU      Musculoskeletal   Abdominal   Peds  Hematology   Anesthesia Other Findings   Reproductive/Obstetrics                             Anesthesia Physical Anesthesia Plan  ASA: III  Anesthesia Plan: General   Post-op Pain Management:    Induction: Intravenous  PONV Risk Score and Plan: Ondansetron and Dexamethasone  Airway Management Planned: Oral ETT  Additional Equipment:   Intra-op Plan:   Post-operative Plan: Extubation in OR  Informed Consent: I have reviewed the patients History and Physical, chart, labs and discussed the procedure including the risks, benefits and alternatives for the proposed anesthesia with the patient or authorized representative who has indicated his/her understanding and acceptance.     Dental advisory given  Plan Discussed with: Anesthesiologist  Anesthesia Plan Comments:         Anesthesia Quick Evaluation

## 2021-01-16 NOTE — Discharge Instructions (Signed)

## 2021-01-16 NOTE — Anesthesia Procedure Notes (Signed)
Procedure Name: Intubation Performed by: Valda Favia, CRNA Pre-anesthesia Checklist: Patient identified, Emergency Drugs available, Suction available and Patient being monitored Patient Re-evaluated:Patient Re-evaluated prior to induction Oxygen Delivery Method: Circle System Utilized Preoxygenation: Pre-oxygenation with 100% oxygen Induction Type: IV induction Ventilation: Mask ventilation without difficulty and Oral airway inserted - appropriate to patient size Laryngoscope Size: Mac and 4 Grade View: Grade I Tube type: Oral Tube size: 7.5 mm Number of attempts: 1 Airway Equipment and Method: Stylet and Oral airway Placement Confirmation: ETT inserted through vocal cords under direct vision,  positive ETCO2 and breath sounds checked- equal and bilateral Secured at: 21 cm Tube secured with: Tape Dental Injury: Teeth and Oropharynx as per pre-operative assessment

## 2021-01-16 NOTE — Transfer of Care (Signed)
Immediate Anesthesia Transfer of Care Note  Patient: Spurgeon Gancarz  Procedure(s) Performed: ATRIAL FIBRILLATION ABLATION (N/A )  Patient Location: Cath Lab  Anesthesia Type:General  Level of Consciousness: awake, alert  and oriented  Airway & Oxygen Therapy: Patient Spontanous Breathing and Patient connected to nasal cannula oxygen  Post-op Assessment: Report given to RN and Post -op Vital signs reviewed and stable  Post vital signs: Reviewed and stable  Last Vitals:  Vitals Value Taken Time  BP 149/87 01/16/21 1226  Temp    Pulse 78 01/16/21 1227  Resp 7 01/16/21 1227  SpO2 92 % 01/16/21 1227  Vitals shown include unvalidated device data.  Last Pain:  Vitals:   01/16/21 1202  TempSrc:   PainSc: 0-No pain      Patients Stated Pain Goal: 3 (01/16/21 0924)  Complications: No complications documented.

## 2021-01-17 ENCOUNTER — Encounter (HOSPITAL_COMMUNITY): Payer: Self-pay | Admitting: Cardiology

## 2021-01-17 ENCOUNTER — Telehealth: Payer: Self-pay | Admitting: Cardiology

## 2021-01-17 NOTE — Telephone Encounter (Signed)
Returned call to wife.  Advised per Dr. Lalla Brothers to take ibuprofen 600 mg by mouth twice a day with food to help with discomfort.  Per wife Pt has protonix and colchicine and is taking as directed.  Advised this is normal and should resolve in a few days.  Advised to let me know if worsens.  Wife indicates understanding.

## 2021-01-17 NOTE — Telephone Encounter (Signed)
Patient's wife states patient is having a lot of pain from his ablation yesterday and would like to discuss this. She states he is unable to breathe well. Please call to discuss.

## 2021-01-23 ENCOUNTER — Ambulatory Visit: Payer: BC Managed Care – PPO | Admitting: Cardiovascular Disease

## 2021-01-23 MED ORDER — TELMISARTAN 40 MG PO TABS: 80.0000 mg | ORAL_TABLET | Freq: Every day | ORAL | Status: DC

## 2021-01-23 NOTE — Telephone Encounter (Signed)
Mr. Mcbroom has previous intolerance to Amlodipine and Spironolactone. Carvedilol was previously reduced due to bradycardia. His previous Chlorthalidone and Diltiazem were discontinued when he was placed on Multaq.  His Hydralazine has been gradually up-titrated to 100mg  TID.  I trialed transition from Losartan to Telmisartan 80mg  QD but he did not feel it was as effective and requested transition back to Losartan.   BP during recent admission for ablation was 140-150/80-90s.  I do think stress regarding recent health circumstances is contributory. It would be worthwhile to have him bring his blood pressure cuff with him to upcoming appointment with Dr. 02/01/21 so we can ensure accuracy as his BPs in hospital were lower than what he is reporting at home. From previous conversations, he is checking BP after medications.   Will request pharmacy input on whether it would be beneficial to transition to alternate ARB such as Valsartan, alternate medication, or follow up as scheduled 02/01/21.  02/03/21, NP

## 2021-01-23 NOTE — Addendum Note (Signed)
Addended by: Alver Sorrow on: 01/23/2021 12:32 PM   Modules accepted: Orders

## 2021-02-01 ENCOUNTER — Ambulatory Visit (INDEPENDENT_AMBULATORY_CARE_PROVIDER_SITE_OTHER): Payer: BC Managed Care – PPO | Admitting: Cardiovascular Disease

## 2021-02-01 ENCOUNTER — Other Ambulatory Visit: Payer: Self-pay

## 2021-02-01 ENCOUNTER — Encounter: Payer: Self-pay | Admitting: Cardiovascular Disease

## 2021-02-01 VITALS — BP 120/72 | HR 62 | Ht 66.5 in | Wt 170.5 lb

## 2021-02-01 DIAGNOSIS — G4733 Obstructive sleep apnea (adult) (pediatric): Secondary | ICD-10-CM

## 2021-02-01 DIAGNOSIS — I4819 Other persistent atrial fibrillation: Secondary | ICD-10-CM | POA: Diagnosis not present

## 2021-02-01 DIAGNOSIS — I2510 Atherosclerotic heart disease of native coronary artery without angina pectoris: Secondary | ICD-10-CM | POA: Diagnosis not present

## 2021-02-01 DIAGNOSIS — E1169 Type 2 diabetes mellitus with other specified complication: Secondary | ICD-10-CM

## 2021-02-01 DIAGNOSIS — I10 Essential (primary) hypertension: Secondary | ICD-10-CM

## 2021-02-01 DIAGNOSIS — E785 Hyperlipidemia, unspecified: Secondary | ICD-10-CM

## 2021-02-01 MED ORDER — CARVEDILOL 25 MG PO TABS: 12.5000 mg | ORAL_TABLET | Freq: Two times a day (BID) | ORAL | 3 refills | Status: DC

## 2021-02-01 MED ORDER — TELMISARTAN 40 MG PO TABS: 80.0000 mg | ORAL_TABLET | Freq: Every day | ORAL | 3 refills | Status: DC

## 2021-02-01 NOTE — Progress Notes (Signed)
Cardiology Office Note  Date:  02/01/2021   ID:  Clinton Dorsey, DOB 1951-05-28, MRN [id number]  PCP:  [name], MD   Chief Complaint  Patient presents with  . 3 month follow up     Follow up s/p cardiac ablation. "doing well." Medications reviewed by the patient verbally.     HPI:  Clinton Dorsey is a very pleasant 70 year-old gentleman with a history of hyperlipidemia,  hypertension,  diabetes, HBA1C 8.5 prior occasional tobacco abuse , quit presented to [facility] 12/07/2013 with chest pain. non-ST elevation MI  Stents to LCX, diagonal, distal RCA (staged) Echo 10/2015: normal EF, RVSP 40, mod MR PAF anxiety He presents today for follow-up of his coronary artery disease, atrial fib following recent ablation  Discussed recent events with him Underwent Ablation for atrial fibrillation, 01/16/2021 Reports having significant chest discomfort for several days following the procedure, this has since improved Still feels tired, feels like he is recovering  New stressors at home,  daughter living with her  Used to work at [employer] sports, now at [employer] part-time  Medication list reviewed with him On coreg 12.5 BID Hydralazine 100 TID, multaq telmisartan 40  Lab work reviewed with him HBA1C 6.8  total chol 111, LDL 55  EKG personally reviewed by myself on todays visit Shows normal sinus rhythm rate 63 bpm unable to exclude old anterior MI, left axis deviation  Other past medical history reviewed Recently seen in the emergency room July 20, 2019 for palpitations Started 8:30 in the morning watch told him he was in atrial fibrillation EKG in the emergency room showing atrial fibrillation Plavix was held Changed to Eliquis 5 twice daily Amlodipine changed to diltiazem Converted overnight to normal sinus rhythm  Previous Holter and stress test, Holter did show APCs, PVCs, idioventricular escape complexes, poor short run of SVT  Myoview stress test showing no  ischemia  Cardiac catheterization was performed that showed severe three-vessel CAD. He had stent for distal RCA/ostial PL branch disease estimated at 99%. He also had mid circumflex with 99% disease, severe diagonal #1 disease. Attempt to place a stent to the mid circumflex was unsuccessful and given that he had significant dye and radiation exposure, it was recommended that he have PCI of his left circumflex and diagonal vessel at a later date. Followup catheterization at [facility] with successful stents to his mid circumflex and first diagonal vessel   echocardiogram in the hospital showed ejection fraction 50-55%, mildly dilated left atrium, mild TR, low to moderate MR, no focal wall motion abnormalities noted   PMH:   has a past medical history of Arrhythmia, Coronary artery disease, Depression, Depression, Diabetes mellitus (HCC), Hyperlipidemia, Hypertension, Hypokalemia, Kidney stones, MI (myocardial infarction) (HCC) (11/2013), Mitral regurgitation, Obesity, and Tobacco abuse.  PSH:    Past Surgical History:  Procedure Laterality Date  . ATRIAL FIBRILLATION ABLATION N/A 01/16/2021   Procedure: ATRIAL FIBRILLATION ABLATION;  Surgeon: Lanier Prude, MD;  Location: [facility] INVASIVE CV LAB;  Service: Cardiovascular;  Laterality: N/A;  . CARDIAC CATHETERIZATION  12/08/2013  . CARDIOVERSION N/A 09/22/2020   Procedure: CARDIOVERSION;  Surgeon: Antonieta Iba, MD;  Location: [facility] ORS;  Service: Cardiovascular;  Laterality: N/A;  . CARDIOVERSION N/A 11/08/2020   Procedure: CARDIOVERSION;  Surgeon: Yvonne Kendall, MD;  Location: [facility] ORS;  Service: Cardiovascular;  Laterality: N/A;  . CORONARY ANGIOPLASTY  12/08/2013   s/p stent placement  . HERNIA REPAIR    . LAMINECTOMY    . PERCUTANEOUS  CORONARY STENT INTERVENTION (PCI-S) N/A 12/23/2013   Procedure: PERCUTANEOUS CORONARY STENT INTERVENTION (PCI-S);  Surgeon: [name], MD;  Location: [facility] CATH LAB;  Service: Cardiovascular;   Laterality: N/A;  . SPINE SURGERY  2014  . TONSILLECTOMY      Current Outpatient Medications  Medication Sig Dispense Refill  . buPROPion (WELLBUTRIN XL) 150 MG 24 hr tablet Take 1 tablet (150 mg total) by mouth daily. 30 tablet 0  . cholecalciferol (VITAMIN D3) 25 MCG (1000 UNIT) tablet Take 1,000 Units by mouth daily.    . Coenzyme Q10 (COQ10) 100 MG CAPS Take 100 mg by mouth daily.    . colchicine 0.6 MG tablet Take 0.5 tablets (0.3 mg total) by mouth 2 (two) times daily. 5 tablet 0  . dronedarone (MULTAQ) 400 MG tablet Take 1 tablet (400 mg total) by mouth 2 (two) times daily with a meal. 180 tablet 3  . ELIQUIS 5 MG TABS tablet TAKE 1 TABLET(5 MG) BY MOUTH TWICE DAILY 180 tablet 1  . ezetimibe (ZETIA) 10 MG tablet TAKE 1 TABLET(10 MG) BY MOUTH DAILY 90 tablet 1  . Glucose Blood (BLOOD GLUCOSE TEST STRIPS) STRP 1 strip by In Vitro route daily. 100 each 12  . hydrALAZINE (APRESOLINE) 100 MG tablet Take 1 tablet (100 mg total) by mouth 3 (three) times daily. 270 tablet 3  . loratadine (CLARITIN) 10 MG tablet Take 10 mg by mouth daily as needed for allergies.    . magnesium oxide (MAG-OX) 400 MG tablet Take 400 mg by mouth daily.    . Melatonin 10 MG CAPS Take 10 mg by mouth at bedtime as needed (sleep).    . metFORMIN (GLUCOPHAGE) 500 MG tablet Take 500 mg by mouth daily with supper.    . Multiple Vitamin (MULTIVITAMIN) capsule Take 1 capsule by mouth daily.    . pantoprazole (PROTONIX) 40 MG tablet Take 1 tablet (40 mg total) by mouth daily. 45 tablet 0  . potassium chloride SA (KLOR-CON) 20 MEQ tablet Take 1 tablet (20 mEq total) by mouth daily. 90 tablet 3  . rosuvastatin (CRESTOR) 20 MG tablet TAKE 1 TABLET(20 MG) BY MOUTH DAILY 90 tablet 1  . sildenafil (REVATIO) 20 MG tablet TAKE 1-5 TABLETS BY MOUTH DAILY AS NEEDED 90 tablet 0  . carvedilol (COREG) 25 MG tablet Take 0.5 tablets (12.5 mg total) by mouth 2 (two) times daily. 180 tablet 3  . telmisartan (MICARDIS) 40 MG tablet Take 2  tablets (80 mg total) by mouth daily. 180 tablet 3   No current facility-administered medications for this visit.     Allergies:   Spironolactone, Penicillins, Atorvastatin, and Simvastatin   Social History:  The patient  reports that he quit smoking about 7 years ago. His smoking use included cigars. He has never used smokeless tobacco. He reports that he does not drink alcohol and does not use drugs.   Family History:   family history includes Heart attack (age of onset: 57) in his father; Heart disease in his father; Hypertension in his mother and sister.    Review of Systems: Review of Systems  Constitutional: Negative.   HENT: Negative.   Respiratory: Negative.   Cardiovascular: Negative.   Gastrointestinal: Negative.   Musculoskeletal: Negative.   Neurological: Negative.   Psychiatric/Behavioral: Negative.   All other systems reviewed and are negative.   PHYSICAL EXAM: VS:  BP 120/72 (BP Location: Left Arm, Patient Position: Sitting, Cuff Size: Normal)   Pulse 62   Ht 5' 6.5" (  1.689 m)   Wt 170 lb 8 oz (77.3 kg)   SpO2 98%   BMI 27.11 kg/m  , BMI Body mass index is 27.11 kg/m. Constitutional:  oriented to person, place, and time. No distress.  HENT:  Head: Grossly normal Eyes:  no discharge. No scleral icterus.  Neck: No JVD, no carotid bruits  Cardiovascular: Regular rate and rhythm, no murmurs appreciated Pulmonary/Chest: Clear to auscultation bilaterally, no wheezes or rails Abdominal: Soft.  no distension.  no tenderness.  Musculoskeletal: Normal range of motion Neurological:  normal muscle tone. Coordination normal. No atrophy Skin: Skin warm and dry Psychiatric: normal affect, pleasant  Recent Labs: 11/07/2020: TSH 1.056 11/16/2020: ALT 44; Magnesium 2.5 11/23/2020: B Natriuretic Peptide 408.1 01/11/2021: BUN 28; Creatinine, Ser 1.10; Hemoglobin 13.1; Platelets 229; Potassium 3.6; Sodium 138    Lipid Panel Lab Results  Component Value Date   CHOL 114  09/29/2020   HDL 38 (L) 09/29/2020   LDLCALC 57 09/29/2020   TRIG 102 09/29/2020      Wt Readings from Last 3 Encounters:  02/01/21 170 lb 8 oz (77.3 kg)  01/16/21 171 lb (77.6 kg)  11/30/20 172 lb 3.2 oz (78.1 kg)      ASSESSMENT AND PLAN:  Atherosclerosis of native coronary artery of native heart with stable angina pectoris (HCC) - Plan: EKG 12-Lead Currently with no symptoms of angina. No further workup at this time. Continue current medication regimen.  NSTEMI (non-ST elevated myocardial infarction) (HCC) - On aspirin, no anginal symptoms No further work-up at this time  Essential hypertension - Plan: EKG 12-Lead Blood pressure is well controlled on today's visit. No changes made to the medications.  Paroxysmal atrial fibrillation Recent ablation, followed by EP On carvedilol 12.5 twice daily, Multaq, Eliquis  Mixed hyperlipidemia - Plan: EKG 12-Lead on Crestor Zetia,  Numbers at goal  Poorly controlled type 2 diabetes mellitus with circulatory disorder (HCC) - Hemoglobin A1c  dramatically improved Weight down  Tobacco abuse -  Previously stopped smoking Stressed importance of smoking cessation   Total encounter time more than 25 minutes  Greater than 50% was spent in counseling and coordination of care with the patient    Orders Placed This Encounter  Procedures  . EKG 12-Lead     Signed, Dossie Arbour, M.D., Ph.D. 02/01/2021  [facility] [place], [place] [phone]

## 2021-02-01 NOTE — Patient Instructions (Signed)
Medication Instructions:  No changes  If you need a refill on your cardiac medications before your next appointment, please call your pharmacy.    Lab work: No new labs needed   If you have labs (blood work) drawn today and your tests are completely normal, you will receive your results only by: . MyChart Message (if you have MyChart) OR . A paper copy in the mail If you have any lab test that is abnormal or we need to change your treatment, we will call you to review the results.   Testing/Procedures: No new testing needed   Follow-Up: At [facility], you and your health needs are our priority.  As part of our continuing mission to provide you with exceptional heart care, we have created designated Provider Care Teams.  These Care Teams include your primary Cardiologist (physician) and Advanced Practice Providers (APPs -  Physician Assistants and Nurse Practitioners) who all work together to provide you with the care you need, when you need it.  . You will need a follow up appointment in 6 months  . Providers on your designated Care Team:   . Christopher Berge, NP . Ryan Dunn, PA-C . Jacquelyn Visser, PA-C  Any Other Special Instructions Will Be Listed Below (If Applicable).  COVID-19 Vaccine Information can be found at: [email or web address] For questions related to vaccine distribution or appointments, please email [email or web address] or call [phone].     

## 2021-02-14 ENCOUNTER — Encounter (HOSPITAL_COMMUNITY): Payer: Self-pay | Admitting: Physician Assistant

## 2021-02-14 ENCOUNTER — Ambulatory Visit (HOSPITAL_COMMUNITY)
Admission: RE | Admit: 2021-02-14 | Discharge: 2021-02-14 | Disposition: A | Discharge status: Home / Self Care | Payer: BC Managed Care – PPO | Source: Ambulatory Visit | Attending: Physician Assistant | Admitting: Physician Assistant

## 2021-02-14 ENCOUNTER — Other Ambulatory Visit: Payer: Self-pay

## 2021-02-14 VITALS — BP 162/96 | HR 63 | Ht 66.5 in | Wt 173.4 lb

## 2021-02-14 DIAGNOSIS — Z7901 Long term (current) use of anticoagulants: Secondary | ICD-10-CM | POA: Insufficient documentation

## 2021-02-14 DIAGNOSIS — I340 Nonrheumatic mitral (valve) insufficiency: Secondary | ICD-10-CM | POA: Insufficient documentation

## 2021-02-14 DIAGNOSIS — I4819 Other persistent atrial fibrillation: Secondary | ICD-10-CM | POA: Diagnosis not present

## 2021-02-14 DIAGNOSIS — I2510 Atherosclerotic heart disease of native coronary artery without angina pectoris: Secondary | ICD-10-CM | POA: Insufficient documentation

## 2021-02-14 DIAGNOSIS — D6869 Other thrombophilia: Secondary | ICD-10-CM | POA: Diagnosis not present

## 2021-02-14 DIAGNOSIS — Z6827 Body mass index (BMI) 27.0-27.9, adult: Secondary | ICD-10-CM | POA: Insufficient documentation

## 2021-02-14 DIAGNOSIS — Z7984 Long term (current) use of oral hypoglycemic drugs: Secondary | ICD-10-CM | POA: Insufficient documentation

## 2021-02-14 DIAGNOSIS — I10 Essential (primary) hypertension: Secondary | ICD-10-CM | POA: Insufficient documentation

## 2021-02-14 DIAGNOSIS — E785 Hyperlipidemia, unspecified: Secondary | ICD-10-CM | POA: Insufficient documentation

## 2021-02-14 DIAGNOSIS — Z87891 Personal history of nicotine dependence: Secondary | ICD-10-CM | POA: Insufficient documentation

## 2021-02-14 DIAGNOSIS — Z955 Presence of coronary angioplasty implant and graft: Secondary | ICD-10-CM | POA: Insufficient documentation

## 2021-02-14 DIAGNOSIS — E119 Type 2 diabetes mellitus without complications: Secondary | ICD-10-CM | POA: Insufficient documentation

## 2021-02-14 DIAGNOSIS — E669 Obesity, unspecified: Secondary | ICD-10-CM | POA: Diagnosis not present

## 2021-02-14 DIAGNOSIS — Z8249 Family history of ischemic heart disease and other diseases of the circulatory system: Secondary | ICD-10-CM | POA: Insufficient documentation

## 2021-02-14 DIAGNOSIS — I252 Old myocardial infarction: Secondary | ICD-10-CM | POA: Diagnosis not present

## 2021-02-14 DIAGNOSIS — Z79899 Other long term (current) drug therapy: Secondary | ICD-10-CM | POA: Diagnosis not present

## 2021-02-14 NOTE — Progress Notes (Signed)
Primary Care Physician: [name], MD Primary Cardiologist: Dr Mariah Milling Primary Electrophysiologist: Dr Lalla Brothers Referring Physician: Dr Peri Maris Hoffmeier is a 70 y.o. male with a history of CAD, HTN, HLD, DM, tobacco abuse, atrial fibrillation who presents for consultation in the [facility].  The patient was initially diagnosed with atrial fibrillation 07/2019. Patient is on Eliquis for a CHADS2VASC score of 4. He underwent two cardioversions early 2022 and was started on Multaq.  On follow up today, patient is s/p afib ablation with Dr Lalla Brothers on 01/16/21. He denies any heart racing or palpitations. He did have some CP post ablation but this resolved with ibuprofen. He denies swallowing pain or groin issues.   Today, he denies symptoms of palpitations, chest pain, shortness of breath, orthopnea, PND, lower extremity edema, dizziness, presyncope, syncope, snoring, daytime somnolence, bleeding, or neurologic sequela. The patient is tolerating medications without difficulties and is otherwise without complaint today.    Atrial Fibrillation Risk Factors:  he does not have symptoms or diagnosis of sleep apnea. he does not have a history of rheumatic fever. he does have a history of alcohol use.   he has a BMI of Body mass index is 27.57 kg/m.Marland Kitchen Filed Weights   02/14/21 1002  Weight: 78.7 kg    Family History  Problem Relation Age of Onset  . Heart attack Father 38       MI  . Heart disease Father        CABG  . Hypertension Mother   . Hypertension Sister      Atrial Fibrillation Management history:  Previous antiarrhythmic drugs: Multaq Previous cardioversions: 09/22/20, 11/08/20 Previous ablations: 01/16/21 CHADS2VASC score: 4 Anticoagulation history: Eliquis   Past Medical History:  Diagnosis Date  . Arrhythmia   . Coronary artery disease    a. NSTEMI; b. 12/23/13 cath 11/2013: D1 80%, LCx 90% failed PCI, dRCA 99%, PL branch  99% s/p PCI/DES,  EF 55%; c. staged cardiac cath 12/2013: s/p PCI/DES to both D1 and mLCx, resdial LM 40%, ostial LCx 50%  . Depression   . Depression   . Diabetes mellitus (HCC)    a. poorly controlled  . Hyperlipidemia   . Hypertension   . Hypokalemia    a. recurrent  . Kidney stones   . MI (myocardial infarction) (HCC) 11/2013  . Mitral regurgitation    a. echo 11/2013: EF 55-60%, mild LVH, mildly dilated LA, mild TR, mild to mod MR, mildly elevated RVSP    . Obesity   . Tobacco abuse    Past Surgical History:  Procedure Laterality Date  . ATRIAL FIBRILLATION ABLATION N/A 01/16/2021   Procedure: ATRIAL FIBRILLATION ABLATION;  Surgeon: Lanier Prude, MD;  Location: [facility] INVASIVE CV LAB;  Service: Cardiovascular;  Laterality: N/A;  . CARDIAC CATHETERIZATION  12/08/2013  . CARDIOVERSION N/A 09/22/2020   Procedure: CARDIOVERSION;  Surgeon: Antonieta Iba, MD;  Location: [facility] ORS;  Service: Cardiovascular;  Laterality: N/A;  . CARDIOVERSION N/A 11/08/2020   Procedure: CARDIOVERSION;  Surgeon: Yvonne Kendall, MD;  Location: [facility] ORS;  Service: Cardiovascular;  Laterality: N/A;  . CORONARY ANGIOPLASTY  12/08/2013   s/p stent placement  . HERNIA REPAIR    . LAMINECTOMY    . PERCUTANEOUS CORONARY STENT INTERVENTION (PCI-S) N/A 12/23/2013   Procedure: PERCUTANEOUS CORONARY STENT INTERVENTION (PCI-S);  Surgeon: [name], MD;  Location: [facility] CATH LAB;  Service: Cardiovascular;  Laterality: N/A;  . SPINE SURGERY  2014  .  TONSILLECTOMY      Current Outpatient Medications  Medication Sig Dispense Refill  . buPROPion (WELLBUTRIN XL) 150 MG 24 hr tablet Take 1 tablet (150 mg total) by mouth daily. 30 tablet 0  . carvedilol (COREG) 25 MG tablet Take 0.5 tablets (12.5 mg total) by mouth 2 (two) times daily. 180 tablet 3  . cholecalciferol (VITAMIN D3) 25 MCG (1000 UNIT) tablet Take 1,000 Units by mouth daily.    . Coenzyme Q10 (COQ10) 100 MG CAPS Take 100 mg by mouth daily.    .  colchicine 0.6 MG tablet Take 0.5 tablets (0.3 mg total) by mouth 2 (two) times daily. 5 tablet 0  . dronedarone (MULTAQ) 400 MG tablet Take 1 tablet (400 mg total) by mouth 2 (two) times daily with a meal. 180 tablet 3  . ELIQUIS 5 MG TABS tablet TAKE 1 TABLET(5 MG) BY MOUTH TWICE DAILY 180 tablet 1  . ezetimibe (ZETIA) 10 MG tablet TAKE 1 TABLET(10 MG) BY MOUTH DAILY 90 tablet 1  . Glucose Blood (BLOOD GLUCOSE TEST STRIPS) STRP 1 strip by In Vitro route daily. 100 each 12  . hydrALAZINE (APRESOLINE) 100 MG tablet Take 1 tablet (100 mg total) by mouth 3 (three) times daily. 270 tablet 3  . loratadine (CLARITIN) 10 MG tablet Take 10 mg by mouth daily as needed for allergies.    . magnesium oxide (MAG-OX) 400 MG tablet Take 400 mg by mouth daily.    . Melatonin 10 MG CAPS Take 10 mg by mouth at bedtime as needed (sleep).    . metFORMIN (GLUCOPHAGE) 500 MG tablet Take 500 mg by mouth daily with supper.    . Multiple Vitamin (MULTIVITAMIN) capsule Take 1 capsule by mouth daily.    . potassium chloride SA (KLOR-CON) 20 MEQ tablet Take 1 tablet (20 mEq total) by mouth daily. 90 tablet 3  . rosuvastatin (CRESTOR) 20 MG tablet TAKE 1 TABLET(20 MG) BY MOUTH DAILY 90 tablet 1  . sildenafil (REVATIO) 20 MG tablet TAKE 1-5 TABLETS BY MOUTH DAILY AS NEEDED 90 tablet 0  . telmisartan (MICARDIS) 40 MG tablet Take 2 tablets (80 mg total) by mouth daily. 180 tablet 3   No current facility-administered medications for this encounter.    Allergies  Allergen Reactions  . Spironolactone Other (See Comments)    Gynomastia  . Penicillins Hives and Rash       . Atorvastatin Other (See Comments)    Myalgias   . Simvastatin Other (See Comments)    Myalgias    Social History   Socioeconomic History  . Marital status: Married    Spouse name: Not on file  . Number of children: Not on file  . Years of education: Not on file  . Highest education level: Not on file  Occupational History  . Not on file   Tobacco Use  . Smoking status: Former Smoker    Types: Cigars    Quit date: 12/08/2013    Years since quitting: 7.1  . Smokeless tobacco: Never Used  Vaping Use  . Vaping Use: Not on file  Substance and Sexual Activity  . Alcohol use: Yes    Alcohol/week: 1.0 standard drink    Types: 1 Glasses of wine per week    Comment: occasional  . Drug use: No  . Sexual activity: Not on file  Other Topics Concern  . Not on file  Social History Narrative  . Not on file   Social Determinants of Health  Financial Resource Strain: Not on file  Food Insecurity: Not on file  Transportation Needs: Not on file  Physical Activity: Not on file  Stress: Not on file  Social Connections: Not on file  Intimate Partner Violence: Not on file     ROS- All systems are reviewed and negative except as per the HPI above.  Physical Exam: Vitals:   02/14/21 1002  BP: (!) 162/96  Pulse: 63  Weight: 78.7 kg  Height: 5' 6.5" (1.689 m)    GEN- The patient is a well appearing male, alert and oriented x 3 today.   Head- normocephalic, atraumatic Eyes-  Sclera clear, conjunctiva pink Ears- hearing intact Oropharynx- clear Neck- supple  Lungs- Clear to ausculation bilaterally, normal work of breathing Heart- Regular rate and rhythm, no murmurs, rubs or gallops  GI- soft, NT, ND, + BS Extremities- no clubbing, cyanosis, or edema MS- no significant deformity or atrophy Skin- no rash or lesion Psych- euthymic mood, full affect Neuro- strength and sensation are intact  Wt Readings from Last 3 Encounters:  02/14/21 78.7 kg  02/01/21 77.3 kg  01/16/21 77.6 kg    EKG today demonstrates  SR Vent. rate 63 BPM PR interval 178 ms QRS duration 88 ms QT/QTcB 434/444 ms  Echo 09/07/20 demonstrated  1. Left ventricular ejection fraction, by estimation, is 55 to 60%. The  left ventricle has normal function. The left ventricle has no regional  wall motion abnormalities. There is mild left ventricular  hypertrophy.  Left ventricular diastolic parameters  are indeterminate.  2. Right ventricular systolic function is low normal. The right  ventricular size is mildly enlarged.  3. Left atrial size was severely dilated.  4. Right atrial size was moderately dilated.  5. The mitral valve is normal in structure. Mild mitral valve  regurgitation.  6. The aortic valve is tricuspid. Aortic valve regurgitation is not  visualized. Mild aortic valve sclerosis is present, with no evidence of  aortic valve stenosis.  7. The inferior vena cava is normal in size with greater than 50%  respiratory variability, suggesting right atrial pressure of 3 mmHg.   Epic records are reviewed at length today  CHA2DS2-VASc Score = 4  The patient's score is based upon: CHF History: No HTN History: Yes Diabetes History: Yes Stroke History: No Vascular Disease History: Yes Age Score: 1 Gender Score: 0      ASSESSMENT AND PLAN: 1. Persistent Atrial Fibrillation (ICD10:  I48.19) The patient's CHA2DS2-VASc score is 4, indicating a 4.8% annual risk of stroke.   S/p afib ablation 01/16/21 He appears to be maintaining SR.  Continue Multaq 400 mg BID Continue Coreg 12.5 mg BID Continue Eliquis 5 mg BID with no missed doses for at least 3 months post ablation.   2. Secondary Hypercoagulable State (ICD10:  D68.69) The patient is at significant risk for stroke/thromboembolism based upon his CHA2DS2-VASc Score of 4.  Continue Apixaban (Eliquis).   3. HTN He brings in his BP log from home which shows better control and his BP at his office visit 5/18 was normal. No changes today. Could consider referral to HTN clinic if refractory.    4. CAD No anginal symptoms.   Follow up with Dr Lalla Brothers as scheduled.    Jorja Loa PA-C [facility] [address] [place], [place] [address] [phone] 02/14/2021 10:15 AM

## 2021-02-15 DIAGNOSIS — E119 Type 2 diabetes mellitus without complications: Secondary | ICD-10-CM | POA: Diagnosis not present

## 2021-02-16 ENCOUNTER — Ambulatory Visit: Payer: BC Managed Care – PPO | Admitting: Cardiovascular Disease

## 2021-03-03 ENCOUNTER — Other Ambulatory Visit: Payer: Self-pay | Admitting: Family

## 2021-03-08 DIAGNOSIS — S9032XA Contusion of left foot, initial encounter: Secondary | ICD-10-CM | POA: Diagnosis not present

## 2021-03-17 DIAGNOSIS — E119 Type 2 diabetes mellitus without complications: Secondary | ICD-10-CM | POA: Diagnosis not present

## 2021-04-11 DIAGNOSIS — I2510 Atherosclerotic heart disease of native coronary artery without angina pectoris: Secondary | ICD-10-CM | POA: Diagnosis not present

## 2021-04-11 DIAGNOSIS — I480 Paroxysmal atrial fibrillation: Secondary | ICD-10-CM | POA: Diagnosis not present

## 2021-04-11 DIAGNOSIS — Z0000 Encounter for general adult medical examination without abnormal findings: Secondary | ICD-10-CM | POA: Diagnosis not present

## 2021-04-11 DIAGNOSIS — I10 Essential (primary) hypertension: Secondary | ICD-10-CM | POA: Diagnosis not present

## 2021-04-11 DIAGNOSIS — R972 Elevated prostate specific antigen [PSA]: Secondary | ICD-10-CM | POA: Diagnosis not present

## 2021-04-11 DIAGNOSIS — E1159 Type 2 diabetes mellitus with other circulatory complications: Secondary | ICD-10-CM | POA: Diagnosis not present

## 2021-04-13 ENCOUNTER — Other Ambulatory Visit: Payer: Self-pay | Admitting: Cardiovascular Disease

## 2021-04-17 DIAGNOSIS — E119 Type 2 diabetes mellitus without complications: Secondary | ICD-10-CM | POA: Diagnosis not present

## 2021-04-19 ENCOUNTER — Encounter: Payer: Self-pay | Admitting: Cardiology

## 2021-04-19 ENCOUNTER — Ambulatory Visit (INDEPENDENT_AMBULATORY_CARE_PROVIDER_SITE_OTHER): Payer: BC Managed Care – PPO | Admitting: Cardiology

## 2021-04-19 ENCOUNTER — Other Ambulatory Visit: Payer: Self-pay

## 2021-04-19 VITALS — BP 158/90 | HR 60 | Ht 66.5 in | Wt 173.0 lb

## 2021-04-19 DIAGNOSIS — I10 Essential (primary) hypertension: Secondary | ICD-10-CM | POA: Diagnosis not present

## 2021-04-19 DIAGNOSIS — I2510 Atherosclerotic heart disease of native coronary artery without angina pectoris: Secondary | ICD-10-CM | POA: Diagnosis not present

## 2021-04-19 DIAGNOSIS — I4819 Other persistent atrial fibrillation: Secondary | ICD-10-CM

## 2021-04-19 MED ORDER — CARVEDILOL 25 MG PO TABS: 25.0000 mg | ORAL_TABLET | Freq: Two times a day (BID) | ORAL | 3 refills | Status: DC

## 2021-04-19 NOTE — Progress Notes (Signed)
Electrophysiology Office Follow up Visit Note:    Date:  04/19/2021   ID:  Clinton Dorsey, DOB 1951-06-08, MRN [id number]  PCP:  Kirk Ruths, MD  [place] Cardiologist:  Ida Rogue, MD  [facility] Electrophysiologist:  Vickie Epley, MD    Interval History:    Clinton Dorsey is a 70 y.o. male who presents for a follow up visit.  He underwent a successful atrial fibrillation ablation on Jan 16, 2021.  During the ablation, the pulmonary veins and posterior wall were isolated.  He saw Adline Peals, PA-C in follow-up on Feb 14, 2021.  At that appointment he reported no palpitations or recurrence of arrhythmia.  He is with his wife today in clinic who I have previously met.  He is struggling with some depression because of some family stressors.  He is seeing a Social worker in the coming week.  He has not had any recurrence of his arrhythmia.  He is interested in stopping the Multaq.  He checks his blood pressures at home frequently.  They vary significantly with systolics ranging from 409B to 180s.   Past Medical History:  Diagnosis Date   Arrhythmia    Coronary artery disease    a. NSTEMI; b. 12/23/13 cath 11/2013: D1 80%, LCx 90% failed PCI, dRCA 99%, PL branch 99% s/p PCI/DES,  EF 55%; c. staged cardiac cath 12/2013: s/p PCI/DES to both D1 and mLCx, resdial LM 40%, ostial LCx 50%   Depression    Depression    Diabetes mellitus ([place])    a. poorly controlled   Hyperlipidemia    Hypertension    Hypokalemia    a. recurrent   Kidney stones    MI (myocardial infarction) ([place]) 11/2013   Mitral regurgitation    a. echo 11/2013: EF 55-60%, mild LVH, mildly dilated LA, mild TR, mild to mod MR, mildly elevated RVSP     Obesity    Tobacco abuse     Past Surgical History:  Procedure Laterality Date   ATRIAL FIBRILLATION ABLATION N/A 01/16/2021   Procedure: ATRIAL FIBRILLATION ABLATION;  Surgeon: Vickie Epley, MD;  Location: [facility];  Service:  Cardiovascular;  Laterality: N/A;   CARDIAC CATHETERIZATION  12/08/2013   CARDIOVERSION N/A 09/22/2020   Procedure: CARDIOVERSION;  Surgeon: Minna Merritts, MD;  Location: [facility];  Service: Cardiovascular;  Laterality: N/A;   CARDIOVERSION N/A 11/08/2020   Procedure: CARDIOVERSION;  Surgeon: Nelva Bush, MD;  Location: [facility];  Service: Cardiovascular;  Laterality: N/A;   CORONARY ANGIOPLASTY  12/08/2013   s/p stent placement   HERNIA REPAIR     LAMINECTOMY     PERCUTANEOUS CORONARY STENT INTERVENTION (PCI-S) N/A 12/23/2013   Procedure: PERCUTANEOUS CORONARY STENT INTERVENTION (PCI-S);  Surgeon: Wellington Hampshire, MD;  Location: [facility];  Service: Cardiovascular;  Laterality: N/A;   SPINE SURGERY  2014   TONSILLECTOMY      Current Medications: Current Meds  Medication Sig   buPROPion (WELLBUTRIN XL) 150 MG 24 hr tablet Take 1 tablet (150 mg total) by mouth daily.   cholecalciferol (VITAMIN D3) 25 MCG (1000 UNIT) tablet Take 1,000 Units by mouth daily.   Coenzyme Q10 (COQ10) 100 MG CAPS Take 100 mg by mouth daily.   ELIQUIS 5 MG TABS tablet TAKE 1 TABLET(5 MG) BY MOUTH TWICE DAILY   ezetimibe (ZETIA) 10 MG tablet TAKE 1 TABLET(10 MG) BY MOUTH DAILY   Glucose Blood (BLOOD GLUCOSE TEST STRIPS) STRP 1 strip by In Vitro  route daily.   hydrALAZINE (APRESOLINE) 100 MG tablet Take 1 tablet (100 mg total) by mouth 3 (three) times daily.   magnesium oxide (MAG-OX) 400 MG tablet Take 400 mg by mouth daily.   Melatonin 10 MG CAPS Take 10 mg by mouth at bedtime as needed (sleep).   metFORMIN (GLUCOPHAGE) 500 MG tablet Take 500 mg by mouth daily with supper.   Multiple Vitamin (MULTIVITAMIN) capsule Take 1 capsule by mouth daily.   potassium chloride SA (KLOR-CON) 20 MEQ tablet Take 1 tablet (20 mEq total) by mouth daily.   rosuvastatin (CRESTOR) 20 MG tablet TAKE 1 TABLET(20 MG) BY MOUTH DAILY   sildenafil (REVATIO) 20 MG tablet TAKE 1-5 TABLETS BY MOUTH DAILY AS NEEDED   telmisartan  (MICARDIS) 40 MG tablet Take 2 tablets (80 mg total) by mouth daily.   [DISCONTINUED] carvedilol (COREG) 25 MG tablet Take 0.5 tablets (12.5 mg total) by mouth 2 (two) times daily.   [DISCONTINUED] dronedarone (MULTAQ) 400 MG tablet Take 1 tablet (400 mg total) by mouth 2 (two) times daily with a meal.     Allergies:   Spironolactone, Penicillins, Atorvastatin, and Simvastatin   Social History   Socioeconomic History   Marital status: Married    Spouse name: Not on file   Number of children: Not on file   Years of education: Not on file   Highest education level: Not on file  Occupational History   Not on file  Tobacco Use   Smoking status: Former    Types: Cigars    Quit date: 12/08/2013    Years since quitting: 7.3   Smokeless tobacco: Never  Vaping Use   Vaping Use: Not on file  Substance and Sexual Activity   Alcohol use: Yes    Alcohol/week: 1.0 standard drink    Types: 1 Glasses of wine per week    Comment: occasional   Drug use: No   Sexual activity: Not on file  Other Topics Concern   Not on file  Social History Narrative   Not on file   Social Determinants of Health   Financial Resource Strain: Not on file  Food Insecurity: Not on file  Transportation Needs: Not on file  Physical Activity: Not on file  Stress: Not on file  Social Connections: Not on file     Family History: The patient's family history includes Heart attack (age of onset: 67) in his father; Heart disease in his father; Hypertension in his mother and sister.  ROS:   Please see the history of present illness.    All other systems reviewed and are negative.  EKGs/Labs/Other Studies Reviewed:    The following studies were reviewed today:   EKG:  The ekg ordered today demonstrates normal sinus rhythm.  Recent Labs: 11/07/2020: TSH 1.056 11/16/2020: ALT 44; Magnesium 2.5 11/23/2020: B Natriuretic Peptide 408.1 01/11/2021: BUN 28; Creatinine, Ser 1.10; Hemoglobin 13.1; Platelets 229;  Potassium 3.6; Sodium 138  Recent Lipid Panel    Component Value Date/Time   CHOL 114 09/29/2020 0920   CHOL 164 07/10/2016 1525   CHOL 182 12/07/2013 0956   TRIG 102 09/29/2020 0920   TRIG 108 07/10/2016 1525   TRIG 224 (H) 12/07/2013 0956   HDL 38 (L) 09/29/2020 0920   HDL 33 (L) 12/07/2013 0956   CHOLHDL 3.0 09/29/2020 0920   VLDL 22 07/10/2016 1525   VLDL 45 (H) 12/07/2013 0956   LDLCALC 57 09/29/2020 0920   LDLCALC 104 (H) 12/07/2013 4656  Physical Exam:    VS:  BP (!) 158/90 (BP Location: Left Arm, Patient Position: Sitting, Cuff Size: Normal)   Pulse 60   Ht 5' 6.5" (1.689 m)   Wt 173 lb (78.5 kg)   SpO2 97%   BMI 27.50 kg/m     Wt Readings from Last 3 Encounters:  04/19/21 173 lb (78.5 kg)  02/14/21 173 lb 6.4 oz (78.7 kg)  02/01/21 170 lb 8 oz (77.3 kg)     GEN:  Well nourished, well developed in no acute distress HEENT: Normal NECK: No JVD; No carotid bruits LYMPHATICS: No lymphadenopathy CARDIAC: RRR, no murmurs, rubs, gallops RESPIRATORY:  Clear to auscultation without rales, wheezing or rhonchi  ABDOMEN: Soft, non-tender, non-distended MUSCULOSKELETAL:  No edema; No deformity  SKIN: Warm and dry NEUROLOGIC:  Alert and oriented x 3 PSYCHIATRIC:  Normal affect   ASSESSMENT:    1. Persistent atrial fibrillation ([place])   2. Primary hypertension   3. Coronary artery disease involving native coronary artery of native heart without angina pectoris    PLAN:    In order of problems listed above:  1. Persistent atrial fibrillation (H[facility]) Maintaining normal rhythm after his ablation.  Continue Eliquis for stroke prophylaxis.  At this point, I think we can stop Multaq.  He will increase his Coreg to 25 mg by mouth twice daily.  2. Primary hypertension Variable.  Elevated today.  He checks his blood pressures regularly at home.  To help achieve some consistency in his regimen, he will change his telmisartan to 40 mg by mouth twice daily and monitor for an  effect.  If he is still above goal, he will increase his Coreg to 25 mg by mouth twice daily.  He is still working on getting his CPAP mask after being told he had a diagnosis of sleep apnea.  I think this will be important for long-term control of his blood pressures.  3. Coronary artery disease involving native coronary artery of native heart without angina pectoris No ischemic symptoms.   Follow-up 1 year      Medication Adjustments/Labs and Tests Ordered: Current medicines are reviewed at length with the patient today.  Concerns regarding medicines are outlined above.  Orders Placed This Encounter  Procedures   EKG 12-Lead   No orders of the defined types were placed in this encounter.    Signed, Lars Mage, MD, [facility], [facility] 04/19/2021 10:06 AM    Electrophysiology [facility]

## 2021-04-19 NOTE — Patient Instructions (Addendum)
Medication Instructions:  Your physician has recommended you make the following change in your medication:    STOP dronedarone (Multaq)  2.  INCREASE your carvedilol 25 mg-  Take one tablet by mouth twice a day  *If you need a refill on your cardiac medications before your next appointment, please call your pharmacy*  Lab Work: None ordered. If you have labs (blood work) drawn today and your tests are completely normal, you will receive your results only by: MyChart Message (if you have MyChart) OR A paper copy in the mail If you have any lab test that is abnormal or we need to change your treatment, we will call you to review the results.  Testing/Procedures: None ordered.  Follow-Up: At [facility], you and your health needs are our priority.  As part of our continuing mission to provide you with exceptional heart care, we have created designated Provider Care Teams.  These Care Teams include your primary Cardiologist (physician) and Advanced Practice Providers (APPs -  Physician Assistants and Nurse Practitioners) who all work together to provide you with the care you need, when you need it.  Your next appointment:   Your physician wants you to follow-up in: one year with Clinton Prude, MD.   Clinton Dorsey will receive a reminder letter in the mail two months in advance. If you don't receive a letter, please call our office to schedule the follow-up appointment.

## 2021-04-25 DIAGNOSIS — F4323 Adjustment disorder with mixed anxiety and depressed mood: Secondary | ICD-10-CM | POA: Diagnosis not present

## 2021-05-03 DIAGNOSIS — F4323 Adjustment disorder with mixed anxiety and depressed mood: Secondary | ICD-10-CM | POA: Diagnosis not present

## 2021-05-16 DIAGNOSIS — G4733 Obstructive sleep apnea (adult) (pediatric): Secondary | ICD-10-CM | POA: Diagnosis not present

## 2021-05-18 DIAGNOSIS — E119 Type 2 diabetes mellitus without complications: Secondary | ICD-10-CM | POA: Diagnosis not present

## 2021-05-29 ENCOUNTER — Other Ambulatory Visit: Payer: Self-pay | Admitting: Cardiovascular Disease

## 2021-05-30 NOTE — Telephone Encounter (Signed)
Eliquis 5mg  refill request received. Patient is 70 years old, weight-78.5kg, Crea-1.10 on 01/11/2021, Diagnosis-Afib, and last seen by Dr. 01/13/2021 on 04/19/2021. Dose is appropriate based on dosing criteria. Will send in refill to requested pharmacy.

## 2021-05-30 NOTE — Telephone Encounter (Signed)
Refill Request.  

## 2021-06-07 ENCOUNTER — Telehealth: Payer: Self-pay | Admitting: Cardiovascular Disease

## 2021-06-07 NOTE — Telephone Encounter (Signed)
Pt c/o BP issue: STAT if pt c/o blurred vision, one-sided weakness or slurred speech  1. What are your last 5 BP readings? 186/113 today about 1 hour after meds  but  trending 160's/90 last couple of weeks   HR went up to 150 and is sporadic readings per watch but not in afib  2. Are you having any other symptoms (ex. Dizziness, headache, blurred vision, passed out)? Fatigue    3. What is your BP issue? Patient concerned he is not taking the right combo of meds .  States he is under stress and this may contribute to HTN

## 2021-06-07 NOTE — Telephone Encounter (Signed)
Was able to reach back out to Clinton Dorsey regarding his BP, HR< and feeling fatigue. Pt reports HR has been 149 and BP 180-190 SBP.  Pt reports taking his Coreg 25 mg BID Hydralazine 100 TID.  Has taken both this am, took hydralazine around 9 am, advised okay to take 2nd dose at 12:30 or 1 pm.  Current BP 160/89, HR 62. BP trending down.  Pt has Hx of HTN, runs 150-160 SBP, pt is wanting to be seen as he feels he needs more medication adjustment that his BP should not be running high everyday. Reports is going to monitor his BP/HR more closely. Denies BP or SOB, just some fatigue at times.   Able to schedule for this Friday with Ward Givens, NP 9/23 at 10:55 pm.   Clinton Dorsey thankful for the quick return call and the upcoming appt. Otherwise all questions or concerns were address and no additional concerns at this time. Agreeable to plan, will call back for anything further.

## 2021-06-09 ENCOUNTER — Other Ambulatory Visit: Payer: Self-pay

## 2021-06-09 ENCOUNTER — Encounter: Payer: Self-pay | Admitting: Nurse Practitioner

## 2021-06-09 ENCOUNTER — Ambulatory Visit (INDEPENDENT_AMBULATORY_CARE_PROVIDER_SITE_OTHER): Payer: BC Managed Care – PPO | Admitting: Nurse Practitioner

## 2021-06-09 VITALS — BP 176/96 | HR 65 | Ht 66.5 in | Wt 178.2 lb

## 2021-06-09 DIAGNOSIS — I2510 Atherosclerotic heart disease of native coronary artery without angina pectoris: Secondary | ICD-10-CM | POA: Diagnosis not present

## 2021-06-09 DIAGNOSIS — E785 Hyperlipidemia, unspecified: Secondary | ICD-10-CM

## 2021-06-09 DIAGNOSIS — I10 Essential (primary) hypertension: Secondary | ICD-10-CM | POA: Diagnosis not present

## 2021-06-09 DIAGNOSIS — I4819 Other persistent atrial fibrillation: Secondary | ICD-10-CM | POA: Diagnosis not present

## 2021-06-09 DIAGNOSIS — G4733 Obstructive sleep apnea (adult) (pediatric): Secondary | ICD-10-CM

## 2021-06-09 MED ORDER — DILTIAZEM HCL ER COATED BEADS 120 MG PO CP24: 120.0000 mg | ORAL_CAPSULE | Freq: Every day | ORAL | 2 refills | Status: DC

## 2021-06-09 NOTE — Patient Instructions (Addendum)
Medication Instructions:  Your physician has recommended you make the following change in your medication:   START Diltiazem CD 120 mg once daily   *If you need a refill on your cardiac medications before your next appointment, please call your pharmacy*   Lab Work: None  If you have labs (blood work) drawn today and your tests are completely normal, you will receive your results only by: MyChart Message (if you have MyChart) OR A paper copy in the mail If you have any lab test that is abnormal or we need to change your treatment, we will call you to review the results.   Testing/Procedures: Your physician has requested that you have a renal artery duplex.   During this test, an ultrasound is used to evaluate blood flow to the kidneys. Allow one hour for this exam. Do not eat after midnight the day before and avoid carbonated beverages. Take your medications as you usually do.    Follow-Up: At [facility], you and your health needs are our priority.  As part of our continuing mission to provide you with exceptional heart care, we have created designated Provider Care Teams.  These Care Teams include your primary Cardiologist (physician) and Advanced Practice Providers (APPs -  Physician Assistants and Nurse Practitioners) who all work together to provide you with the care you need, when you need it.   Your next appointment:   4-6 week(s)  The format for your next appointment:   In Person  Provider:   [name], MD or Nicolasa Ducking, NP   Other Instructions Monitor blood pressures and call us with those numbers.  Here are some tips. Call us at [phone].  Please monitor blood pressures and keep a log of your readings.   Make sure to check 2 hours after your medications.   AVOID these things for 30 minutes before checking your blood pressure: No Drinking caffeine. No Drinking alcohol. No Eating. No Smoking. No Exercising.  Five minutes before checking  your blood pressure: Pee. Sit in a dining chair. Avoid sitting in a soft couch or armchair. Be quiet. Do not talk.

## 2021-06-09 NOTE — Progress Notes (Signed)
Office Visit    Patient Name: Clinton Dorsey Date of Encounter: 06/09/2021  Primary Care Provider:  [name], MD Primary Cardiologist:  [name], MD  Chief Complaint    70 year old male with a history of CAD status post non-STEMI in March 2015, persistent atrial fibrillation now status post PVI, hypertension, hyperlipidemia, type 2 diabetes mellitus, tobacco abuse, and obesity, who presents for follow-up related to hypertension.  Past Medical History    Past Medical History:  Diagnosis Date   Coronary artery disease    a. NSTEMI cath 11/2013: D1 80%, LCx 90% failed PCI, dRCA 99%, PL branch 99% s/p PCI/DES,  EF 55%; b. staged cardiac cath 12/2013: s/p PCI/DES to both D1 and mLCx, resdial LM 40%, ostial LCx 50%   Depression    Diabetes mellitus (HCC)    History of echocardiogram    a. 08/2020 Echo: EF 55-60%, no rwma, mild LVH. Low-nl RV fxn. Mildly enlarged RV. Sev dil LA. Mod dil RA. Mild MR. Mild AoV sclerosis w/o stenosis.   Hyperlipidemia    Hypertension    Hypokalemia    a. recurrent   Kidney stones    MI (myocardial infarction) (HCC) 11/2013   Mitral regurgitation    a. echo 11/2013: EF 55-60%, mild LVH, mildly dilated LA, mild TR, mild to mod MR, mildly elevated RVSP     Obesity    Persistent atrial fibrillation (HCC)    a. 10/2020 s/p DCCV; b. 01/2021 s/p successful Afib ablation/PVI.   Tobacco abuse    Past Surgical History:  Procedure Laterality Date   ATRIAL FIBRILLATION ABLATION N/A 01/16/2021   Procedure: ATRIAL FIBRILLATION ABLATION;  Surgeon: Lanier Prude, MD;  Location: [facility];  Service: Cardiovascular;  Laterality: N/A;   CARDIAC CATHETERIZATION  12/08/2013   CARDIOVERSION N/A 09/22/2020   Procedure: CARDIOVERSION;  Surgeon: Antonieta Iba, MD;  Location: [facility] ORS;  Service: Cardiovascular;  Laterality: N/A;   CARDIOVERSION N/A 11/08/2020   Procedure: CARDIOVERSION;  Surgeon: Yvonne Kendall, MD;  Location: [facility] ORS;   Service: Cardiovascular;  Laterality: N/A;   CORONARY ANGIOPLASTY  12/08/2013   s/p stent placement   HERNIA REPAIR     LAMINECTOMY     PERCUTANEOUS CORONARY STENT INTERVENTION (PCI-S) N/A 12/23/2013   Procedure: PERCUTANEOUS CORONARY STENT INTERVENTION (PCI-S);  Surgeon: [name], MD;  Location: [facility] CATH LAB;  Service: Cardiovascular;  Laterality: N/A;   SPINE SURGERY  2014   TONSILLECTOMY      Allergies  Allergies  Allergen Reactions   Spironolactone Other (See Comments)    Gynomastia   Penicillins Hives and Rash        Amlodipine     Ankle swelling   Atorvastatin Other (See Comments)    Myalgias    Simvastatin Other (See Comments)    Myalgias    History of Present Illness    70 year old male with the above complex past medical history including coronary artery disease status post non-STEMI, persistent atrial fibrillation status post PVI, hypertension, hyperlipidemia, type 2 diabetes mellitus, tobacco abuse, and obesity.  In March 2015, he was admitted to [facility] for non-STEMI.  Echo showed an EF of 55 to 60%.  Diagnostic catheterization revealed severe distal RCA disease and ostial RPL disease as well as circumflex and diagonal disease.  The RCA and ostial RPL were successfully treated with drug-eluting stent and he subsequently underwent staged procedure of the circumflex and diagonal in April 2015 at [facility].  In November 2020, he  was seen in the emergency department with diagnosis of atrial fibrillation.  He was placed on Eliquis and initially converted to sinus rhythm on diltiazem.  He had recurrent atrial fibrillation requiring admission in December 2021.  Echo at that time showed an EF of 55 to 60% without regional wall motion abnormalities.  He was noted to have low normal RV function with severely dilated left atrium, moderately dilated right atrium, mild MR, and mild aortic valve sclerosis without stenosis.  He subsequently underwent cardioversion as an outpatient on  September 20, 2020.  This was followed by sinus bradycardia resulting in reduction of diltiazem dose.  Unfortunately, in late February he had recurrent atrial fibrillation and required repeat cardioversion with initiation of Multaq 400 mg twice daily.  He was referred to electrophysiology and subsequently underwent successful pulmonary vein isolation on May 2.  He was last seen in EP clinic on August 3, at which time he was maintaining sinus rhythm.  Multaq was discontinued.  He was having ongoing hypertension and telmisartan was changed from 80 mg daily to 40 mg twice daily while carvedilol was increased to 25 mg twice daily.  Patient contacted the office on September 21 due to elevated blood pressures at home.  He checks his blood pressure pretty closely and recently, he has been trending in the 160s to 170s with occasional jumps into the 190s or even up to 200 on rare occasions.  He says he has had hypertension dating back to his 61s and is never really been well controlled.  When he was on diltiazem prior to his ablation, his pressure routinely trended in the 140s, but since coming off of it, pressure has been higher despite titration of carvedilol and hydralazine.  He was previously on amlodipine but this was stopped sometime around 2015 secondary to significant lower extremity edema.  He did not experience edema with diltiazem.  Spironolactone was previously caused gynecomastia.  He notes he has had some stress related to some family issues and also changes in the stock market.  He thinks maybe that is driving his blood pressure now.  He has not had any palpitations or recurrence of atrial fibrillation that he is aware of.  He denies chest pain, dyspnea, PND, orthopnea, dizziness, syncope, edema, or early satiety.  Home Medications    Current Outpatient Medications  Medication Sig Dispense Refill   apixaban (ELIQUIS) 5 MG TABS tablet TAKE 1 TABLET(5 MG) BY MOUTH TWICE DAILY 180 tablet 2   buPROPion  (WELLBUTRIN XL) 150 MG 24 hr tablet Take 1 tablet (150 mg total) by mouth daily. 30 tablet 0   carvedilol (COREG) 25 MG tablet Take 1 tablet (25 mg total) by mouth 2 (two) times daily. 180 tablet 3   cholecalciferol (VITAMIN D3) 25 MCG (1000 UNIT) tablet Take 1,000 Units by mouth daily.     Coenzyme Q10 (COQ10) 100 MG CAPS Take 100 mg by mouth daily.     diltiazem (DILTIAZEM CD) 120 MG 24 hr capsule Take 1 capsule (120 mg total) by mouth daily. 30 capsule 2   ezetimibe (ZETIA) 10 MG tablet TAKE 1 TABLET(10 MG) BY MOUTH DAILY 90 tablet 1   Glucose Blood (BLOOD GLUCOSE TEST STRIPS) STRP 1 strip by In Vitro route daily. 100 each 12   hydrALAZINE (APRESOLINE) 100 MG tablet Take 1 tablet (100 mg total) by mouth 3 (three) times daily. 270 tablet 3   loratadine (CLARITIN) 10 MG tablet Take 10 mg by mouth daily as needed for allergies.  magnesium oxide (MAG-OX) 400 MG tablet Take 400 mg by mouth daily.     Melatonin 10 MG CAPS Take 10 mg by mouth at bedtime as needed (sleep).     metFORMIN (GLUCOPHAGE) 500 MG tablet Take 500 mg by mouth daily with supper.     Multiple Vitamin (MULTIVITAMIN) capsule Take 1 capsule by mouth daily.     potassium chloride SA (KLOR-CON) 20 MEQ tablet Take 1 tablet (20 mEq total) by mouth daily. 90 tablet 3   rosuvastatin (CRESTOR) 20 MG tablet TAKE 1 TABLET(20 MG) BY MOUTH DAILY 90 tablet 3   sildenafil (REVATIO) 20 MG tablet TAKE 1-5 TABLETS BY MOUTH DAILY AS NEEDED 90 tablet 0   telmisartan (MICARDIS) 40 MG tablet Take 2 tablets (80 mg total) by mouth daily. 180 tablet 2   No current facility-administered medications for this visit.     Review of Systems    Blood pressure elevated but otherwise asymptomatic.  He denies chest pain, palpitations, dyspnea, pnd, orthopnea, n, v, dizziness, syncope, edema, weight gain, or early satiety.  All other systems reviewed and are otherwise negative except as noted above.  Physical Exam    VS:  BP (!) 176/96   Pulse 65   Ht  5' 6.5" (1.689 m)   Wt 178 lb 3.2 oz (80.8 kg)   SpO2 96%   BMI 28.33 kg/m  , BMI Body mass index is 28.33 kg/m.     GEN: Well nourished, well developed, in no acute distress. HEENT: normal. Neck: Supple, no JVD, carotid bruits, or masses. Cardiac: RRR, no murmurs, rubs, or gallops. No clubbing, cyanosis, edema.  Radials/PT 2+ and equal bilaterally.  Respiratory:  Respirations regular and unlabored, clear to auscultation bilaterally. GI: Soft, nontender, nondistended, BS + x 4. MS: no deformity or atrophy. Skin: warm and dry, no rash. Neuro:  Strength and sensation are intact. Psych: Normal affect.  Accessory Clinical Findings    ECG personally reviewed by me today -regular sinus rhythm, 65, prior anteroseptal infarct- no acute changes.  Lab Results  Component Value Date   WBC 6.4 01/11/2021   HGB 13.1 01/11/2021   HCT 38.7 (L) 01/11/2021   MCV 88.2 01/11/2021   PLT 229 01/11/2021   Lab Results  Component Value Date   CREATININE 1.10 01/11/2021   BUN 28 (H) 01/11/2021   NA 138 01/11/2021   K 3.6 01/11/2021   CL 103 01/11/2021   CO2 25 01/11/2021   Lab Results  Component Value Date   ALT 44 11/16/2020   AST 21 11/16/2020   ALKPHOS 58 11/16/2020   BILITOT 0.6 11/16/2020   Lab Results  Component Value Date   CHOL 114 09/29/2020   HDL 38 (L) 09/29/2020   LDLCALC 57 09/29/2020   TRIG 102 09/29/2020   CHOLHDL 3.0 09/29/2020    Lab Results  Component Value Date   HGBA1C 7.2 (H) 11/07/2020    Assessment & Plan    1.  Essential hypertension: Patient has a long history of difficult to control hypertension on multiple medications.  He has been having some increase in stress in his life and recently has noted blood pressures trending in the 160s to 170s with jumps into the 190s or even 200.  We reviewed his medications in detail and options for management today.  He notes that when he was on diltiazem prior to his cardioversion, that he noted improved blood pressure.   He did not previously tolerate amlodipine.  His heart rate is 65  today.  We agreed to add diltiazem CD1 120 mg daily to his regimen which includes carvedilol 25 mg twice daily, hydralazine 100 mg 3 times daily, and telmisartan 40 mg twice daily.  He will continue to follow his blood pressure and heart rate at home.  He will contact us in approximately 10 to 14 days with some readings.  Provided that heart rates are stable, we would likely plan to increase diltiazem further.  Alternatively, we could consider addition of doxazosin.  Patient does not think he is ever had a work-up for renal artery stenosis.  I do not see any indication that he had a renal artery duplex before in our system or in care everywhere.  I will order this today.  If this is normal, we will arrange for work-up of hyperaldosteronism including PRA and serum aldosterone.  2.  Persistent atrial fibrillation: Maintaining sinus rhythm since his catheter ablation in May.  Off of Multaq since August.  Denies any palpitations.  He remains on Eliquis.  3.  Coronary artery disease: Status post prior non-STEMI and PCI/DES to the circumflex and diagonal in April 2015.  He has not been having any chest pain.  He remains on beta-blocker, Zetia, and rosuvastatin therapy.  No aspirin in the setting of Eliquis.  4.  Hyperlipidemia: LDL of 57 in January of this year.  Continue Zetia and rosuvastatin therapy.  5.  Obstructive sleep apnea: Still not using CPAP.  Appropriate treatment will be important in management of A. fib and hypertension.  6.  Type II DM:  A1c 7.2 in Feb.  Mgmt per IM.  7.  Disposition: Patient will contact us within the next 10 to 14 days with with blood pressure and heart rate trends.  Otherwise, plan to follow-up in 4 to 6 weeks.  Renal artery duplex pending.  Nicolasa Ducking, NP 06/09/2021, 12:37 PM

## 2021-06-16 DIAGNOSIS — G4733 Obstructive sleep apnea (adult) (pediatric): Secondary | ICD-10-CM | POA: Diagnosis not present

## 2021-06-17 DIAGNOSIS — E119 Type 2 diabetes mellitus without complications: Secondary | ICD-10-CM | POA: Diagnosis not present

## 2021-07-06 ENCOUNTER — Telehealth: Payer: Self-pay | Admitting: Cardiovascular Disease

## 2021-07-06 NOTE — Telephone Encounter (Signed)
Spoke with the patient. Patient sts that since his AFIB ablation in May 2022 there were changes made to his medications and since then his BP has been difficult to control. Patient sts that his BP has been averaging 170's/100's with HR's in the lower 60 bpms.  Patient is taking his medication as listed/prescribed. Diltiazem CD1 120 mg daily  Carvedilol 25 mg twice daily Hydralazine 100 mg 3 times daily Telmisartan 40 mg twice daily He is scheduled for a Renal Artey duplex on 07/12/21  His BP this morning 2 hours after his medications 167/103. Patient sts that he is not sleeping well and has increased fatigue. He has not been wearing his cpap at night. Advised the patient that this could also be contributing to his elevated BP readings. He will try to resume wearing his cpap tonight.  Patient has also sent 2 mychart messages 1 to Dr. Mariah Milling and the 2nd to Dr. Lalla Brothers. Adv him that these have been fwd to the MDs as requested.  Adv the patient that I will fwd this update to Dr. Mariah Milling for his recommendation. Patient verbalized understanding and voiced appreciation for the call back.

## 2021-07-06 NOTE — Telephone Encounter (Signed)
Pt c/o BP issue: STAT if pt c/o blurred vision, one-sided weakness or slurred speech  1. What are your last 5 BP readings? Averaging 173/107   2. Are you having any other symptoms (ex. Dizziness, headache, blurred vision, passed out)? No - tiredness   3. What is your BP issue? BP has been high.  Does not feel medicine has been working. Please call to discuss - would also like to know if Dr Lalla Brothers has any suggestions.

## 2021-07-10 NOTE — Telephone Encounter (Signed)
Able to reach out to Mr. Incorvaia by phone, f/u on his BP readings, Dr. Mariah Milling able to review and advised  "For high pressure,  Could restart HCTZ as he was taking in the past  Or try diltiazem er 120 BID, up from daily  Alternatively  Could add imdur 30 daily" Thx  TG   Pt reports would like to try the diltiazem 120 BID as he already has it on hand. Will continue to record BP log and bring with him to his f/u appt with Dr. Mariah Milling on 11/2 for review. Mr. Bowring thankful for calling and recommendations.   Med list updated with Diltiazem 120 mg BID

## 2021-07-12 ENCOUNTER — Ambulatory Visit (INDEPENDENT_AMBULATORY_CARE_PROVIDER_SITE_OTHER): Payer: BC Managed Care – PPO

## 2021-07-12 ENCOUNTER — Other Ambulatory Visit: Payer: Self-pay

## 2021-07-12 ENCOUNTER — Other Ambulatory Visit: Payer: Self-pay | Admitting: Nurse Practitioner

## 2021-07-12 DIAGNOSIS — I10 Essential (primary) hypertension: Secondary | ICD-10-CM | POA: Diagnosis not present

## 2021-07-16 DIAGNOSIS — G4733 Obstructive sleep apnea (adult) (pediatric): Secondary | ICD-10-CM | POA: Diagnosis not present

## 2021-07-18 DIAGNOSIS — E119 Type 2 diabetes mellitus without complications: Secondary | ICD-10-CM | POA: Diagnosis not present

## 2021-07-19 ENCOUNTER — Encounter: Payer: Self-pay | Admitting: Cardiovascular Disease

## 2021-07-19 ENCOUNTER — Other Ambulatory Visit: Payer: Self-pay

## 2021-07-19 ENCOUNTER — Ambulatory Visit (INDEPENDENT_AMBULATORY_CARE_PROVIDER_SITE_OTHER): Payer: BC Managed Care – PPO | Admitting: Cardiovascular Disease

## 2021-07-19 VITALS — BP 148/96 | HR 64 | Ht 66.0 in | Wt 177.2 lb

## 2021-07-19 DIAGNOSIS — I25118 Atherosclerotic heart disease of native coronary artery with other forms of angina pectoris: Secondary | ICD-10-CM

## 2021-07-19 DIAGNOSIS — E785 Hyperlipidemia, unspecified: Secondary | ICD-10-CM

## 2021-07-19 DIAGNOSIS — Z23 Encounter for immunization: Secondary | ICD-10-CM

## 2021-07-19 DIAGNOSIS — I4819 Other persistent atrial fibrillation: Secondary | ICD-10-CM | POA: Diagnosis not present

## 2021-07-19 DIAGNOSIS — G4733 Obstructive sleep apnea (adult) (pediatric): Secondary | ICD-10-CM

## 2021-07-19 DIAGNOSIS — E1169 Type 2 diabetes mellitus with other specified complication: Secondary | ICD-10-CM | POA: Diagnosis not present

## 2021-07-19 DIAGNOSIS — I10 Essential (primary) hypertension: Secondary | ICD-10-CM

## 2021-07-19 MED ORDER — DOXAZOSIN MESYLATE 2 MG PO TABS: 2.0000 mg | ORAL_TABLET | Freq: Every evening | ORAL | 3 refills | Status: DC

## 2021-07-19 MED ORDER — DILTIAZEM HCL ER COATED BEADS 240 MG PO CP24: 240.0000 mg | ORAL_CAPSULE | Freq: Every day | ORAL | 3 refills | Status: DC

## 2021-07-19 NOTE — Patient Instructions (Addendum)
Medication Instructions:  Stay on coreg 25 twice a day  Change the diltizem 120 pill  diltiazem ER 240 once day in the morning  Please START  cardura/doxasozin 2 mg in the evening  If you need a refill on your cardiac medications before your next appointment, please call your pharmacy.   Lab work: Aldosterone + Renin activity with ratio.  Testing/Procedures: No new testing needed  Follow-Up: At [facility], you and your health needs are our priority.  As part of our continuing mission to provide you with exceptional heart care, we have created designated Provider Care Teams.  These Care Teams include your primary Cardiologist (physician) and Advanced Practice Providers (APPs -  Physician Assistants and Nurse Practitioners) who all work together to provide you with the care you need, when you need it.  You will need a follow up appointment in 6 months, APP ok  Providers on your designated Care Team:   Nicolasa Ducking, NP Eula Listen, PA-C Cadence Fransico [place], [place]  COVID-19 Vaccine Information can be found at: [email or web address] For questions related to vaccine distribution or appointments, please email [email or web address] or call [phone].   Please monitor blood pressures and keep a log of your readings.  Call the office if BP still running high  How to use a home blood pressure monitor. Be still. Measure at the same time every day. It's important to take the readings at the same time each day, such as morning and evening. Take reading approximately 1 1/2 to 2 hours after BP medications.   AVOID these things for 30 minutes before checking your blood pressure: Drinking caffeine. Drinking alcohol. Eating. Smoking. Exercising.

## 2021-07-19 NOTE — Progress Notes (Signed)
Cardiology Office Note  Date:  07/19/2021   ID:  Clinton Dorsey, DOB 1950/12/25, MRN [id number]  PCP:  Kirk Ruths, MD   Chief Complaint  Patient presents with   6 week follow up     Discuss renal u/s. Patient c/o being under a tremendous amount of stress in his life. Medications reviewed by the patient verbally.     HPI:  [name] is a very pleasant 70 year-old gentleman with a history of hyperlipidemia,  hypertension,  diabetes, HBA1C 8.5 prior occasional tobacco abuse , quit presented to [facility] 12/07/2013 with chest pain. non-ST elevation MI  Stents to LCX, diagonal, distal RCA (staged) Echo 10/2015: normal EF, RVSP 40, mod MR PAF anxiety He presents today for follow-up of his coronary artery disease, atrial fib following recent ablation  Recently seen by one of our providers renal artery ultrasound negative, no stenosis Serum aldosterone/renin test ordered, has not been drawn yet  Stress at home, niece committed suicide, ETOH Some financial stressors, daughter in a divorce, he is having to support her, some bad debt on her part  "I am going to get a counselor" Not sleeping I have a CPAP, not wearing, "it moves", has not had it adjusted  Lab work reviewed A1C 6.5  Further discussion concerning his blood pressure which continues to run high typically higher than [id number] systolic at home Troubled by so many medications Reports he has been doubling his diltiazem 120 ER taking this twice a day blood pressure still elevated  EKG personally reviewed by myself on todays visit Normal sinus rhythm rate 64 bpm APCs, PVCs  Underwent Ablation for atrial fibrillation, 01/16/2021 Reports having significant chest discomfort for several days following the procedure, this has since improved Still feels tired, feels like he is recovering  New stressors at home,  daughter living with her  Used to work at [employer] sports, now at [employer] work reviewed with  him HBA1C 6.8  total chol 111, LDL 55  EKG personally reviewed by myself on todays visit Shows normal sinus rhythm rate 63 bpm unable to exclude old anterior MI, left axis deviation  Other past medical history reviewed Recently seen in the emergency room July 20, 2019 for palpitations Started 8:30 in the morning watch told him he was in atrial fibrillation EKG in the emergency room showing atrial fibrillation Plavix was held Changed to Eliquis 5 twice daily Amlodipine changed to diltiazem Converted overnight to normal sinus rhythm   Previous Holter and stress test, Holter did show APCs, PVCs, idioventricular escape complexes, poor short run of SVT  Myoview stress test showing no ischemia   Cardiac catheterization was performed that showed severe three-vessel CAD. He had stent for distal RCA/ostial PL branch disease estimated at 99%. He also had mid circumflex with 99% disease, severe diagonal #1 disease. Attempt to place a stent to the mid circumflex was unsuccessful and given that he had significant dye and radiation exposure, it was recommended that he have PCI of his left circumflex and diagonal vessel at a later date. Followup catheterization at [place] with successful stents to his mid circumflex and first diagonal vessel    echocardiogram in the hospital showed ejection fraction 50-55%, mildly dilated left atrium, mild TR, low to moderate MR, no focal wall motion abnormalities noted    PMH:   has a past medical history of Coronary artery disease, Depression, Diabetes mellitus ([place]), History of echocardiogram, Hyperlipidemia, Hypertension, Hypokalemia, Kidney stones, MI (myocardial infarction) (  HCC) (11/2013), Mitral regurgitation, Obesity, Persistent atrial fibrillation (HCC), and Tobacco abuse.  PSH:    Past Surgical History:  Procedure Laterality Date   ATRIAL FIBRILLATION ABLATION N/A 01/16/2021   Procedure: ATRIAL FIBRILLATION ABLATION;  Surgeon: Lanier Prude,  MD;  [facility];  Service: Cardiovascular;  Laterality: N/A;   CARDIAC CATHETERIZATION  12/08/2013   CARDIOVERSION N/A 09/22/2020   Procedure: CARDIOVERSION;  Surgeon: Antonieta Iba, MD;  Location: [facility] ORS;  Service: Cardiovascular;  Laterality: N/A;   CARDIOVERSION N/A 11/08/2020   Procedure: CARDIOVERSION;  Surgeon: Yvonne Kendall, MD;  Location: [facility] ORS;  Service: Cardiovascular;  Laterality: N/A;   CORONARY ANGIOPLASTY  12/08/2013   s/p stent placement   HERNIA REPAIR     LAMINECTOMY     PERCUTANEOUS CORONARY STENT INTERVENTION (PCI-S) N/A 12/23/2013   Procedure: PERCUTANEOUS CORONARY STENT INTERVENTION (PCI-S);  Surgeon: [name], MD;  Location: [facility] CATH LAB;  Service: Cardiovascular;  Laterality: N/A;   SPINE SURGERY  2014   TONSILLECTOMY      Current Outpatient Medications  Medication Sig Dispense Refill   apixaban (ELIQUIS) 5 MG TABS tablet TAKE 1 TABLET(5 MG) BY MOUTH TWICE DAILY 180 tablet 2   buPROPion (WELLBUTRIN XL) 150 MG 24 hr tablet Take 1 tablet (150 mg total) by mouth daily. 30 tablet 0   carvedilol (COREG) 25 MG tablet Take 1 tablet (25 mg total) by mouth 2 (two) times daily. 180 tablet 3   cholecalciferol (VITAMIN D3) 25 MCG (1000 UNIT) tablet Take 1,000 Units by mouth daily.     Coenzyme Q10 (COQ10) 100 MG CAPS Take 100 mg by mouth daily.     diltiazem (CARDIZEM) 120 MG tablet Take 120 mg by mouth in the morning and at bedtime.     ezetimibe (ZETIA) 10 MG tablet TAKE 1 TABLET(10 MG) BY MOUTH DAILY 90 tablet 1   Glucose Blood (BLOOD GLUCOSE TEST STRIPS) STRP 1 strip by In Vitro route daily. 100 each 12   hydrALAZINE (APRESOLINE) 100 MG tablet Take 1 tablet (100 mg total) by mouth 3 (three) times daily. 270 tablet 3   loratadine (CLARITIN) 10 MG tablet Take 10 mg by mouth daily as needed for allergies.     magnesium oxide (MAG-OX) 400 MG tablet Take 400 mg by mouth daily.     Melatonin 10 MG CAPS Take 10 mg by mouth at bedtime as needed  (sleep).     metFORMIN (GLUCOPHAGE) 500 MG tablet Take 500 mg by mouth daily with supper.     Multiple Vitamin (MULTIVITAMIN) capsule Take 1 capsule by mouth daily.     potassium chloride SA (KLOR-CON) 20 MEQ tablet Take 1 tablet (20 mEq total) by mouth daily. 90 tablet 3   rosuvastatin (CRESTOR) 20 MG tablet TAKE 1 TABLET(20 MG) BY MOUTH DAILY 90 tablet 3   sildenafil (REVATIO) 20 MG tablet TAKE 1-5 TABLETS BY MOUTH DAILY AS NEEDED 90 tablet 0   telmisartan (MICARDIS) 40 MG tablet Take 2 tablets (80 mg total) by mouth daily. 180 tablet 2   No current facility-administered medications for this visit.     Allergies:   Spironolactone, Penicillins, Amlodipine, Atorvastatin, and Simvastatin   Social History:  The patient  reports that he quit smoking about 7 years ago. His smoking use included cigars. He has never used smokeless tobacco. He reports current alcohol use of about 1.0 standard drink per week. He reports that he does not use drugs.   Family History:  family history includes Heart attack (age of onset: 12) in his father; Heart disease in his father; Hypertension in his mother and sister.    Review of Systems: Review of Systems  Constitutional: Negative.   HENT: Negative.    Respiratory: Negative.    Cardiovascular: Negative.   Gastrointestinal: Negative.   Musculoskeletal: Negative.   Neurological: Negative.   Psychiatric/Behavioral: Negative.    All other systems reviewed and are negative.  PHYSICAL EXAM: VS:  BP (!) 148/96 (BP Location: Left Arm, Patient Position: Sitting, Cuff Size: Normal)   Pulse 64   Ht 5\' 6"  (1.676 m)   Wt 177 lb 4 oz (80.4 kg)   SpO2 98%   BMI 28.61 kg/m  , BMI Body mass index is 28.61 kg/m. Constitutional:  oriented to person, place, and time. No distress.  HENT:  Head: Grossly normal Eyes:  no discharge. No scleral icterus.  Neck: No JVD, no carotid bruits  Cardiovascular: Regular rate and rhythm, no murmurs appreciated Pulmonary/Chest:  Clear to auscultation bilaterally, no wheezes or rails Abdominal: Soft.  no distension.  no tenderness.  Musculoskeletal: Normal range of motion Neurological:  normal muscle tone. Coordination normal. No atrophy Skin: Skin warm and dry Psychiatric: normal affect, pleasant  Recent Labs: 11/07/2020: TSH 1.056 11/16/2020: ALT 44; Magnesium 2.5 11/23/2020: B Natriuretic Peptide 408.1 01/11/2021: BUN 28; Creatinine, Ser 1.10; Hemoglobin 13.1; Platelets 229; Potassium 3.6; Sodium 138    Lipid Panel Lab Results  Component Value Date   CHOL 114 09/29/2020   HDL 38 (L) 09/29/2020   LDLCALC 57 09/29/2020   TRIG 102 09/29/2020      Wt Readings from Last 3 Encounters:  07/19/21 177 lb 4 oz (80.4 kg)  06/09/21 178 lb 3.2 oz (80.8 kg)  04/19/21 173 lb (78.5 kg)      ASSESSMENT AND PLAN:  Atherosclerosis of native coronary artery of native heart with stable angina pectoris (HCC) - Currently with no symptoms of angina. No further workup at this time. Continue current medication regimen.   Essential hypertension - Plan: EKG 12-Lead Blood pressure continues to run high We will check aldosterone to renin ratio In effort to simplify his regiment, stay on carvedilol 25 twice daily, change diltiazem ER up to 240 daily, add Cardura 2 mg in the evening Would hope to advance the Cardura and slowly wean down off the hydralazine  Paroxysmal atrial fibrillation Recent ablation by EP, maintaining normal sinus rhythm  Mixed hyperlipidemia - Plan: EKG 12-Lead on Crestor Zetia,  Numbers at goal  Poorly controlled type 2 diabetes mellitus with circulatory disorder (HCC) - We have encouraged continued exercise, careful diet management in an effort to lose weight.  Tobacco abuse -  Previously stopped smoking    Total encounter time more than 25 minutes  Greater than 50% was spent in counseling and coordination of care with the patient    Orders Placed This Encounter  Procedures   EKG 12-Lead      Signed, 06/19/21, M.D., Ph.D. 07/19/2021  [facility] [place], [place]

## 2021-07-21 DIAGNOSIS — Z1211 Encounter for screening for malignant neoplasm of colon: Secondary | ICD-10-CM | POA: Diagnosis not present

## 2021-07-24 LAB — ALDOSTERONE + RENIN ACTIVITY W/ RATIO
ALDOS/RENIN RATIO: 54.2 — ABNORMAL HIGH (ref 0.0–30.0)
ALDOSTERONE: 14.2 ng/dL (ref 0.0–30.0)
Renin: 0.262 ng/mL/hr (ref 0.167–5.380)

## 2021-07-25 ENCOUNTER — Other Ambulatory Visit: Payer: Self-pay | Admitting: Nurse Practitioner

## 2021-07-27 ENCOUNTER — Telehealth: Payer: Self-pay | Admitting: *Deleted

## 2021-07-27 DIAGNOSIS — I214 Non-ST elevation (NSTEMI) myocardial infarction: Secondary | ICD-10-CM

## 2021-07-27 DIAGNOSIS — I4819 Other persistent atrial fibrillation: Secondary | ICD-10-CM

## 2021-07-27 DIAGNOSIS — E876 Hypokalemia: Secondary | ICD-10-CM

## 2021-07-27 DIAGNOSIS — I10 Essential (primary) hypertension: Secondary | ICD-10-CM

## 2021-07-27 DIAGNOSIS — I25118 Atherosclerotic heart disease of native coronary artery with other forms of angina pectoris: Secondary | ICD-10-CM

## 2021-07-27 NOTE — Telephone Encounter (Signed)
Reviewed results and recommendations with patient. Instructed him to pick up container from the lab in the medical mall. Then once he has completed the 24 hours to then return to them for processing. He is going out of town and would like to do when he returns. We discussed the process of collection. He verbalized understanding of our conversation, instructions, and had no further questions.

## 2021-07-27 NOTE — Telephone Encounter (Signed)
-----   Message from Creig Hines, NP sent at 07/25/2021  6:22 PM EST ----- Aldosterone is normal while renin is normal but on the low end of normal.  The aldosterone to renin ratio is elevated.  Recommend a 24-hour urine collection for aldosterone, which can likely be arranged through the medical mall.  Based upon those findings, we may need to consider abdominal imaging.

## 2021-08-02 ENCOUNTER — Other Ambulatory Visit
Admission: RE | Admit: 2021-08-02 | Discharge: 2021-08-02 | Disposition: A | Discharge status: Home / Self Care | Payer: BC Managed Care – PPO | Source: Ambulatory Visit | Attending: Cardiovascular Disease | Admitting: Cardiovascular Disease

## 2021-08-02 DIAGNOSIS — I10 Essential (primary) hypertension: Secondary | ICD-10-CM | POA: Diagnosis not present

## 2021-08-02 DIAGNOSIS — I4819 Other persistent atrial fibrillation: Secondary | ICD-10-CM | POA: Diagnosis not present

## 2021-08-09 ENCOUNTER — Encounter: Payer: Self-pay | Admitting: Cardiovascular Disease

## 2021-08-09 LAB — MISC LABCORP TEST (SEND OUT): Labcorp test code: 4291

## 2021-08-16 DIAGNOSIS — G4733 Obstructive sleep apnea (adult) (pediatric): Secondary | ICD-10-CM | POA: Diagnosis not present

## 2021-08-17 DIAGNOSIS — E119 Type 2 diabetes mellitus without complications: Secondary | ICD-10-CM | POA: Diagnosis not present

## 2021-08-23 DIAGNOSIS — G4733 Obstructive sleep apnea (adult) (pediatric): Secondary | ICD-10-CM | POA: Diagnosis not present

## 2021-09-17 DIAGNOSIS — E119 Type 2 diabetes mellitus without complications: Secondary | ICD-10-CM | POA: Diagnosis not present

## 2021-10-06 ENCOUNTER — Other Ambulatory Visit: Payer: Self-pay | Admitting: Family

## 2021-10-18 DIAGNOSIS — E119 Type 2 diabetes mellitus without complications: Secondary | ICD-10-CM | POA: Diagnosis not present

## 2021-10-26 ENCOUNTER — Other Ambulatory Visit: Payer: Self-pay | Admitting: Cardiovascular Disease

## 2021-10-26 MED ORDER — SILDENAFIL CITRATE 20 MG PO TABS: 20.0000 mg | ORAL_TABLET | Freq: Three times a day (TID) | ORAL | 2 refills | Status: DC | PRN

## 2021-10-26 NOTE — Telephone Encounter (Signed)
Please review refill for Sildenafil

## 2021-11-01 ENCOUNTER — Other Ambulatory Visit: Payer: Self-pay | Admitting: Cardiology

## 2021-11-02 MED ORDER — CARVEDILOL 25 MG PO TABS: 25.0000 mg | ORAL_TABLET | Freq: Two times a day (BID) | ORAL | 1 refills | Status: DC

## 2021-11-03 DIAGNOSIS — H524 Presbyopia: Secondary | ICD-10-CM | POA: Diagnosis not present

## 2021-11-03 DIAGNOSIS — E119 Type 2 diabetes mellitus without complications: Secondary | ICD-10-CM | POA: Diagnosis not present

## 2021-11-03 DIAGNOSIS — H2513 Age-related nuclear cataract, bilateral: Secondary | ICD-10-CM | POA: Diagnosis not present

## 2021-11-06 NOTE — Progress Notes (Signed)
Cardiology Office Note  Date:  11/07/2021   ID:  Clinton Dorsey, DOB Mar 15, 1951, MRN [id number]  PCP:  [name], MD   Chief Complaint  Patient presents with   6 month follow up     Patient stopped Doxazosin 3 weeks ago due to swelling in ankles. Medications reviewed by the patient verbally.     HPI:  [name] is a very pleasant 71 year-old gentleman with a history of hyperlipidemia,  hypertension,  diabetes, HBA1C 6.5 prior occasional tobacco abuse , quit presented to [facility] 12/07/2013 with chest pain. non-ST elevation MI  Stents to LCX, diagonal, distal RCA (staged) Echo 10/2015: normal EF, RVSP 40, mod MR PAF anxiety He presents today for follow-up of his coronary artery disease, atrial fib following recent ablation  LOV 11/22  renal artery ultrasound negative, no stenosis Serum aldosterone/renin test  Aldosterone is normal while renin is normal but on the low end of normal.  The aldosterone to renin ratio is elevated.  Recommend a 24-hour urine collection within normal range  OSA on CPAP, started 3 months ago  Lasb reviewed Total chol 131 LDL 68 A1C 6.5  Moving out of the area, new house May need to set up with new cardiology as he is 3 hours away  EKG personally reviewed by myself on todays visit Nsr rate 92 consider old anterior MI  Other past medical history reviewed Stress at home, niece committed suicide, ETOH Some financial stressors, daughter in a divorce, he is having to support her, some bad debt on her part  Ablation for atrial fibrillation, 01/16/2021  Lab work reviewed with him HBA1C 6.8  total chol 111, LDL 55  emergency room July 20, 2019 for palpitations Started 8:30 in the morning watch told him he was in atrial fibrillation EKG in the emergency room showing atrial fibrillation Plavix was held Changed to Eliquis 5 twice daily Amlodipine changed to diltiazem Converted overnight to normal sinus rhythm   Previous Holter and  stress test, Holter did show APCs, PVCs, idioventricular escape complexes, poor short run of SVT  Myoview stress test showing no ischemia   Cardiac catheterization  severe three-vessel CAD. He had stent for distal RCA/ostial PL branch disease estimated at 99%. He also had mid circumflex with 99% disease, severe diagonal #1 disease. Attempt to place a stent to the mid circumflex was unsuccessful and given that he had significant dye and radiation exposure, it was recommended that he have PCI of his left circumflex and diagonal vessel at a later date. Followup catheterization at [place] with successful stents to his mid circumflex and first diagonal vessel    echocardiogram in the hospital showed ejection fraction 50-55%, mildly dilated left atrium, mild TR, low to moderate MR, no focal wall motion abnormalities noted    PMH:   has a past medical history of Coronary artery disease, Depression, Diabetes mellitus (HCC), History of echocardiogram, Hyperlipidemia, Hypertension, Hypokalemia, Kidney stones, MI (myocardial infarction) (HCC) (11/2013), Mitral regurgitation, Obesity, Persistent atrial fibrillation (HCC), and Tobacco abuse.  PSH:    Past Surgical History:  Procedure Laterality Date   ATRIAL FIBRILLATION ABLATION N/A 01/16/2021   Procedure: ATRIAL FIBRILLATION ABLATION;  Surgeon: Lanier Prude, MD;  [facility];  Service: Cardiovascular;  Laterality: N/A;   CARDIAC CATHETERIZATION  12/08/2013   CARDIOVERSION N/A 09/22/2020   Procedure: CARDIOVERSION;  Surgeon: Antonieta Iba, MD;  Location: [facility] ORS;  Service: Cardiovascular;  Laterality: N/A;   CARDIOVERSION N/A 11/08/2020  Procedure: CARDIOVERSION;  Surgeon: Nelva Bush, MD;  Location: [facility] ORS;  Service: Cardiovascular;  Laterality: N/A;   CORONARY ANGIOPLASTY  12/08/2013   s/p stent placement   HERNIA REPAIR     LAMINECTOMY     PERCUTANEOUS CORONARY STENT INTERVENTION (PCI-S) N/A 12/23/2013    Procedure: PERCUTANEOUS CORONARY STENT INTERVENTION (PCI-S);  Surgeon: Wellington Hampshire, MD;  Location[facility] C[facility] LAB;  Service: Cardiovascular;  Laterality: N/A;   SPINE SURGERY  2014   TONSILLECTOMY      Current Outpatient Medications  Medication Sig Dispense Refill   apixaban (ELIQUIS) 5 MG TABS tablet TAKE 1 TABLET(5 MG) BY MOUTH TWICE DAILY 180 tablet 2   buPROPion (WELLBUTRIN XL) 150 MG 24 hr tablet Take 1 tablet (150 mg total) by mouth daily. 30 tablet 0   carvedilol (COREG) 25 MG tablet Take 1 tablet (25 mg total) by mouth 2 (two) times daily. 180 tablet 1   cholecalciferol (VITAMIN D3) 25 MCG (1000 UNIT) tablet Take 1,000 Units by mouth daily.     Coenzyme Q10 (COQ10) 100 MG CAPS Take 100 mg by mouth daily.     diltiazem (CARDIZEM CD) 240 MG 24 hr capsule Take 1 capsule (240 mg total) by mouth daily. 90 capsule 3   ezetimibe (ZETIA) 10 MG tablet TAKE 1 TABLET(10 MG) BY MOUTH DAILY 90 tablet 1   Glucose Blood (BLOOD GLUCOSE TEST STRIPS) STRP 1 strip by In Vitro route daily. 100 each 12   hydrALAZINE (APRESOLINE) 100 MG tablet Take 1 tablet (100 mg total) by mouth 3 (three) times daily. 270 tablet 3   loratadine (CLARITIN) 10 MG tablet Take 10 mg by mouth daily as needed for allergies.     magnesium oxide (MAG-OX) 400 MG tablet Take 400 mg by mouth daily.     Melatonin 10 MG CAPS Take 10 mg by mouth at bedtime as needed (sleep).     metFORMIN (GLUCOPHAGE) 500 MG tablet Take 500 mg by mouth daily with supper.     Multiple Vitamin (MULTIVITAMIN) capsule Take 1 capsule by mouth daily.     potassium chloride SA (KLOR-CON M) 20 MEQ tablet TAKE 1 TABLET(20 MEQ) BY MOUTH DAILY 90 tablet 3   rosuvastatin (CRESTOR) 20 MG tablet TAKE 1 TABLET(20 MG) BY MOUTH DAILY 90 tablet 3   sildenafil (REVATIO) 20 MG tablet Take 1 tablet (20 mg total) by mouth 3 (three) times daily as needed. 90 tablet 2   telmisartan (MICARDIS) 40 MG tablet Take 2 tablets (80 mg total) by mouth daily. 180 tablet 2   No  current facility-administered medications for this visit.     Allergies:   Spironolactone, Penicillins, Amlodipine, Atorvastatin, and Simvastatin   Social History:  The patient  reports that he quit smoking about 7 years ago. His smoking use included cigars. He has never used smokeless tobacco. He reports current alcohol use of about 1.0 standard drink per week. He reports that he does not use drugs.   Family History:   family history includes Heart attack (age of onset: 47) in his father; Heart disease in his father; Hypertension in his mother and sister.    Review of Systems: Review of Systems  Constitutional: Negative.   HENT: Negative.    Respiratory: Negative.    Cardiovascular: Negative.   Gastrointestinal: Negative.   Musculoskeletal: Negative.   Neurological: Negative.   Psychiatric/Behavioral: Negative.    All other systems reviewed and are negative.  PHYSICAL EXAM: VS:  BP 130/70 (BP Location: Left Arm,  Patient Position: Sitting, Cuff Size: Normal)    Pulse (!) 53    Ht 5' 6.5" (1.689 m)    Wt 180 lb (81.6 kg)    SpO2 98%    BMI 28.62 kg/m  , BMI Body mass index is 28.62 kg/m. Constitutional:  oriented to person, place, and time. No distress.  HENT:  Head: Grossly normal Eyes:  no discharge. No scleral icterus.  Neck: No JVD, no carotid bruits  Cardiovascular: Regular rate and rhythm, no murmurs appreciated Pulmonary/Chest: Clear to auscultation bilaterally, no wheezes or rails Abdominal: Soft.  no distension.  no tenderness.  Musculoskeletal: Normal range of motion Neurological:  normal muscle tone. Coordination normal. No atrophy Skin: Skin warm and dry Psychiatric: normal affect, pleasant  Recent Labs: 11/07/2020: TSH 1.056 11/16/2020: ALT 44; Magnesium 2.5 11/23/2020: B Natriuretic Peptide 408.1 01/11/2021: BUN 28; Creatinine, Ser 1.10; Hemoglobin 13.1; Platelets 229; Potassium 3.6; Sodium 138    Lipid Panel Lab Results  Component Value Date   CHOL 114  09/29/2020   HDL 38 (L) 09/29/2020   LDLCALC 57 09/29/2020   TRIG 102 09/29/2020      Wt Readings from Last 3 Encounters:  11/07/21 180 lb (81.6 kg)  07/19/21 177 lb 4 oz (80.4 kg)  06/09/21 178 lb 3.2 oz (80.8 kg)     ASSESSMENT AND PLAN:  Atherosclerosis of native coronary artery of native heart with stable angina pectoris (HCC) - Currently with no symptoms of angina. No further workup at this time. Continue current medication regimen.  Essential hypertension - Plan: EKG 12-Lead Blood pressure continues to run high on carvedilol 25 twice daily, diltiazem ER up to 240 daily,  He stopped Cardura secondary to side effects No further changes made, blood pressure reasonable on today's visit  Paroxysmal atrial fibrillation Recent ablation by EP, maintaining normal sinus rhythm  Mixed hyperlipidemia - Plan: EKG 12-Lead on Crestor Zetia,  Numbers at goal  Poorly controlled type 2 diabetes mellitus with circulatory disorder ([place]) - We have encouraged continued exercise, careful diet management in an effort to lose weight.  Tobacco abuse -  Previously stopped smoking   Total encounter time more than 30 minutes  Greater than 50% was spent in counseling and coordination of care with the patient     Orders Placed This Encounter  Procedures   EKG 12-Lead     Signed, Esmond Plants, M.D., Ph.D. 11/07/2021  [place], [place]

## 2021-11-07 ENCOUNTER — Other Ambulatory Visit: Payer: Self-pay

## 2021-11-07 ENCOUNTER — Ambulatory Visit (INDEPENDENT_AMBULATORY_CARE_PROVIDER_SITE_OTHER): Payer: BC Managed Care – PPO | Admitting: Cardiovascular Disease

## 2021-11-07 ENCOUNTER — Encounter: Payer: Self-pay | Admitting: Cardiovascular Disease

## 2021-11-07 VITALS — BP 130/70 | HR 53 | Ht 66.5 in | Wt 180.0 lb

## 2021-11-07 DIAGNOSIS — I25118 Atherosclerotic heart disease of native coronary artery with other forms of angina pectoris: Secondary | ICD-10-CM | POA: Diagnosis not present

## 2021-11-07 DIAGNOSIS — I4819 Other persistent atrial fibrillation: Secondary | ICD-10-CM

## 2021-11-07 DIAGNOSIS — E1169 Type 2 diabetes mellitus with other specified complication: Secondary | ICD-10-CM | POA: Diagnosis not present

## 2021-11-07 DIAGNOSIS — I10 Essential (primary) hypertension: Secondary | ICD-10-CM

## 2021-11-07 DIAGNOSIS — E785 Hyperlipidemia, unspecified: Secondary | ICD-10-CM

## 2021-11-07 DIAGNOSIS — G4733 Obstructive sleep apnea (adult) (pediatric): Secondary | ICD-10-CM

## 2021-11-07 MED ORDER — CARVEDILOL 25 MG PO TABS: 25.0000 mg | ORAL_TABLET | Freq: Two times a day (BID) | ORAL | 3 refills | Status: DC

## 2021-11-07 MED ORDER — ROSUVASTATIN CALCIUM 20 MG PO TABS: ORAL_TABLET | ORAL | 3 refills | Status: DC

## 2021-11-07 MED ORDER — SILDENAFIL CITRATE 20 MG PO TABS: 20.0000 mg | ORAL_TABLET | Freq: Three times a day (TID) | ORAL | 3 refills | Status: AC | PRN

## 2021-11-07 MED ORDER — EZETIMIBE 10 MG PO TABS: ORAL_TABLET | ORAL | 3 refills | Status: AC

## 2021-11-07 MED ORDER — POTASSIUM CHLORIDE CRYS ER 20 MEQ PO TBCR: EXTENDED_RELEASE_TABLET | ORAL | 3 refills | Status: DC

## 2021-11-07 MED ORDER — TELMISARTAN 40 MG PO TABS: 80.0000 mg | ORAL_TABLET | Freq: Every day | ORAL | 3 refills | Status: DC

## 2021-11-07 MED ORDER — HYDRALAZINE HCL 100 MG PO TABS: 100.0000 mg | ORAL_TABLET | Freq: Three times a day (TID) | ORAL | 3 refills | Status: DC

## 2021-11-07 MED ORDER — DILTIAZEM HCL ER COATED BEADS 240 MG PO CP24: 240.0000 mg | ORAL_CAPSULE | Freq: Every day | ORAL | 3 refills | Status: DC

## 2021-11-07 NOTE — Patient Instructions (Addendum)
Medication Instructions:  No changes  If you need a refill on your cardiac medications before your next appointment, please call your pharmacy.    Lab work: No new labs needed   Testing/Procedures: No new testing needed   Follow-Up: At [facility], you and your health needs are our priority.  As part of our continuing mission to provide you with exceptional heart care, we have created designated Provider Care Teams.  These Care Teams include your primary Cardiologist (physician) and Advanced Practice Providers (APPs -  Physician Assistants and Nurse Practitioners) who all work together to provide you with the care you need, when you need it.  You will need a follow up appointment in 6 months  Providers on your designated Care Team:   Christopher Berge, NP Ryan Dunn, PA-C Cadence Furth, PA-C  COVID-19 Vaccine Information can be found at: [email or web address] For questions related to vaccine distribution or appointments, please email [email or web address] or call [phone].   

## 2021-11-15 DIAGNOSIS — E119 Type 2 diabetes mellitus without complications: Secondary | ICD-10-CM | POA: Diagnosis not present

## 2021-12-15 DIAGNOSIS — G4733 Obstructive sleep apnea (adult) (pediatric): Secondary | ICD-10-CM | POA: Diagnosis not present

## 2021-12-16 DIAGNOSIS — E119 Type 2 diabetes mellitus without complications: Secondary | ICD-10-CM | POA: Diagnosis not present

## 2021-12-18 ENCOUNTER — Encounter: Payer: Self-pay | Admitting: Cardiovascular Disease

## 2021-12-18 DIAGNOSIS — I4819 Other persistent atrial fibrillation: Secondary | ICD-10-CM

## 2021-12-18 DIAGNOSIS — I25118 Atherosclerotic heart disease of native coronary artery with other forms of angina pectoris: Secondary | ICD-10-CM

## 2022-01-14 DIAGNOSIS — G4733 Obstructive sleep apnea (adult) (pediatric): Secondary | ICD-10-CM | POA: Diagnosis not present

## 2022-01-15 DIAGNOSIS — S91331A Puncture wound without foreign body, right foot, initial encounter: Secondary | ICD-10-CM | POA: Diagnosis not present

## 2022-01-15 DIAGNOSIS — Z23 Encounter for immunization: Secondary | ICD-10-CM | POA: Diagnosis not present

## 2022-01-15 DIAGNOSIS — I10 Essential (primary) hypertension: Secondary | ICD-10-CM | POA: Diagnosis not present

## 2022-01-15 DIAGNOSIS — E119 Type 2 diabetes mellitus without complications: Secondary | ICD-10-CM | POA: Diagnosis not present

## 2022-02-14 DIAGNOSIS — G4733 Obstructive sleep apnea (adult) (pediatric): Secondary | ICD-10-CM | POA: Diagnosis not present

## 2022-02-15 DIAGNOSIS — E119 Type 2 diabetes mellitus without complications: Secondary | ICD-10-CM | POA: Diagnosis not present

## 2022-02-26 ENCOUNTER — Other Ambulatory Visit: Payer: Self-pay | Admitting: Cardiovascular Disease

## 2022-02-26 NOTE — Telephone Encounter (Signed)
Prescription refill request for Eliquis received. Indication: Atrial Fib Last office visit: 11/07/21  Concha Se MD Scr: 1.1 on 04/11/21 Age: 71 Weight: 81.6kg  Based on above findings Eliquis 5mg  twice daily is the appropriate dose.  Refill approved.

## 2022-02-26 NOTE — Telephone Encounter (Signed)
Refill request

## 2022-03-14 ENCOUNTER — Telehealth: Payer: Self-pay | Admitting: Cardiovascular Disease

## 2022-03-14 DIAGNOSIS — I25118 Atherosclerotic heart disease of native coronary artery with other forms of angina pectoris: Secondary | ICD-10-CM

## 2022-03-14 DIAGNOSIS — I4819 Other persistent atrial fibrillation: Secondary | ICD-10-CM

## 2022-03-14 DIAGNOSIS — I340 Nonrheumatic mitral (valve) insufficiency: Secondary | ICD-10-CM

## 2022-03-14 NOTE — Telephone Encounter (Signed)
Order placed for referral.  

## 2022-03-14 NOTE — Telephone Encounter (Signed)
   Pt spouse called, she said they moved to [facility] [place] and they are trying to establish with new cards there for the pt. They need a referral from Dr. Mariah Milling to be send to [facility] for Dr. Syliva Overman Phone# [phone] Fax# [phone]

## 2022-03-14 NOTE — Addendum Note (Signed)
Addended by: Vergia Alberts on: 03/14/2022 11:26 AM   Modules accepted: Orders

## 2022-03-14 NOTE — Addendum Note (Signed)
Addended by: Vergia Alberts on: 03/14/2022 11:25 AM   Modules accepted: Orders

## 2022-03-17 DIAGNOSIS — E119 Type 2 diabetes mellitus without complications: Secondary | ICD-10-CM | POA: Diagnosis not present

## 2022-04-01 IMAGING — CT CT HEAD W/O CM
3 series · 14 of 47 positions shown, 16 images · non-contrast
Comparison: None.

CLINICAL DATA: Neck injury, neck pain, facial injury

EXAM:
CT HEAD WITHOUT CONTRAST
CT CERVICAL SPINE WITHOUT CONTRAST
TECHNIQUE: Multidetector CT imaging of the head and cervical spine was
performed following the standard protocol without intravenous
contrast. Multiplanar CT image reconstructions of the cervical spine
were also generated.

[Series 3: head wo · axial · 0.44mm/px · z∈[+311,+446]mm · 8 of 33 slices shown, 10 images]
[im 3/33  brain]
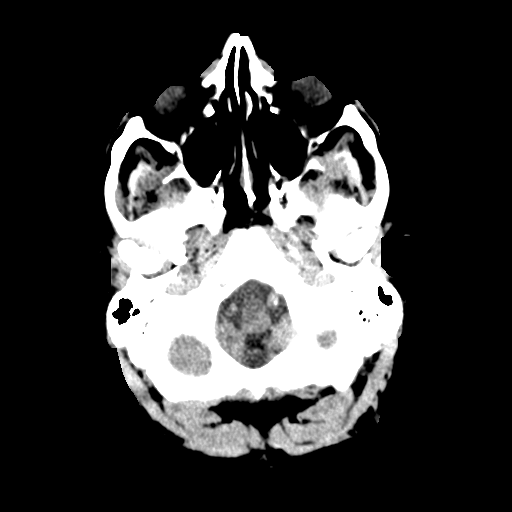
[im 3/33  bone]
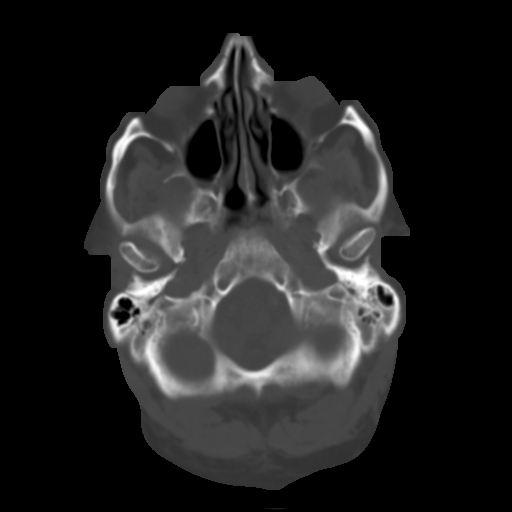
[im 7/33  brain]
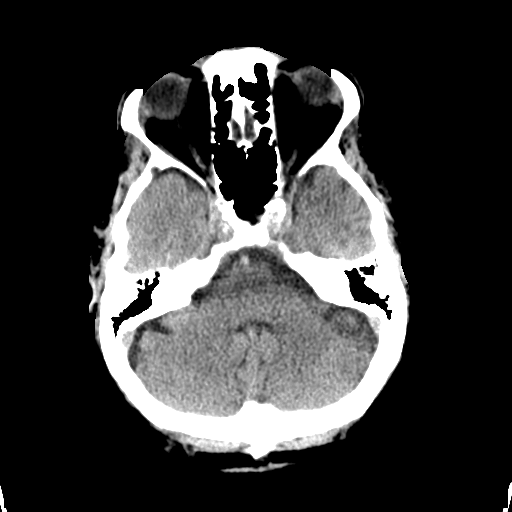
[im 10/33  brain]
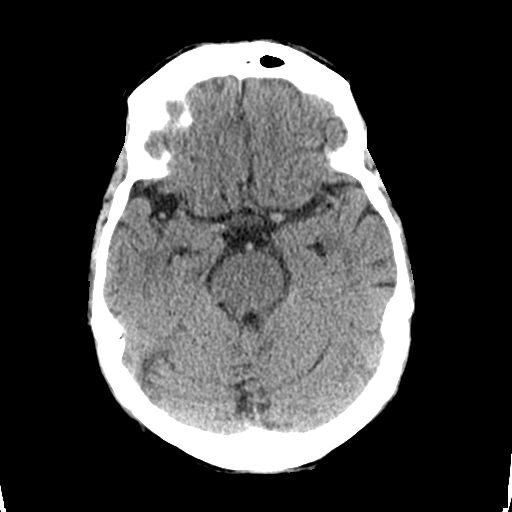
[im 15/33  brain]
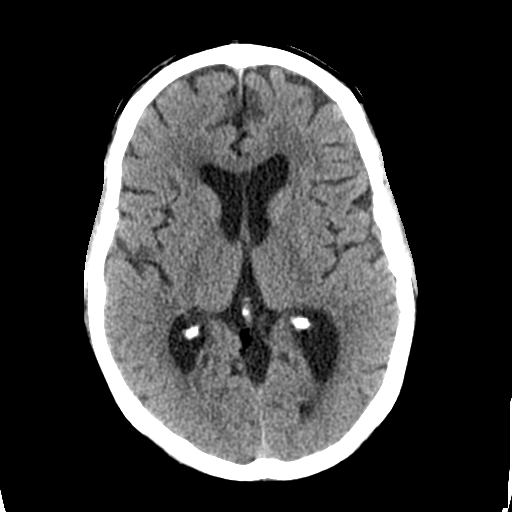
[im 18/33  brain]
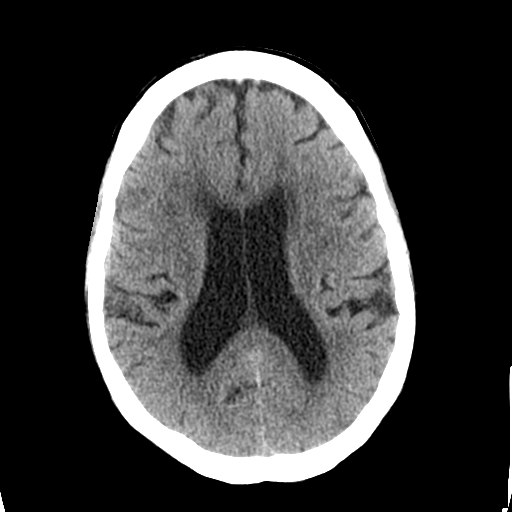
[im 18/33  bone]
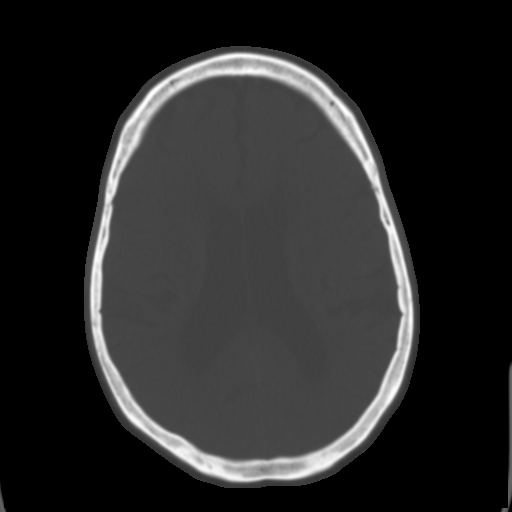
[im 23/33  brain]
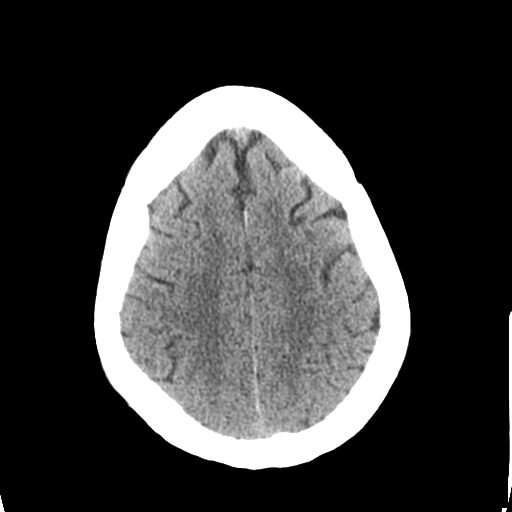
[im 26/33  brain]
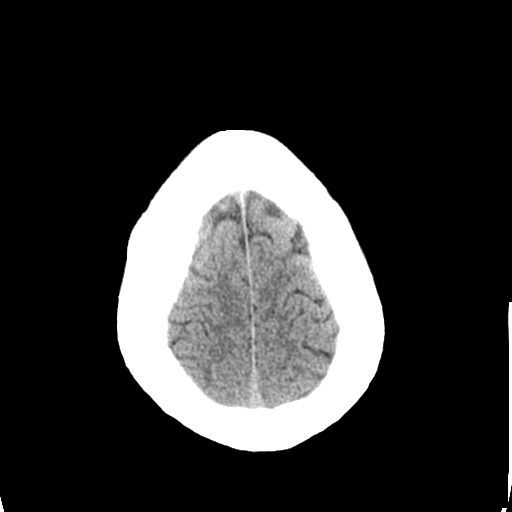
[im 30/33  brain]
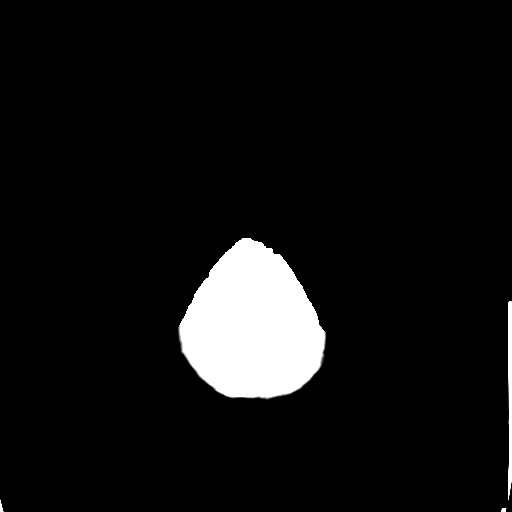

[Series 4: coronal soft tissue · coronal · 0.30mm/px · 3 of 68 slices shown]
[im 23/68  brain]
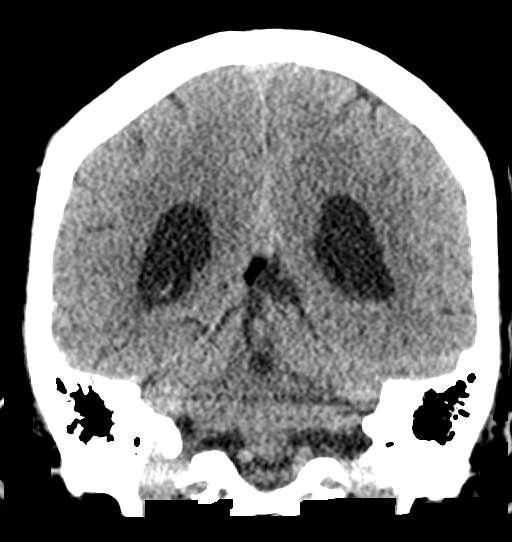
[im 30/68  brain]
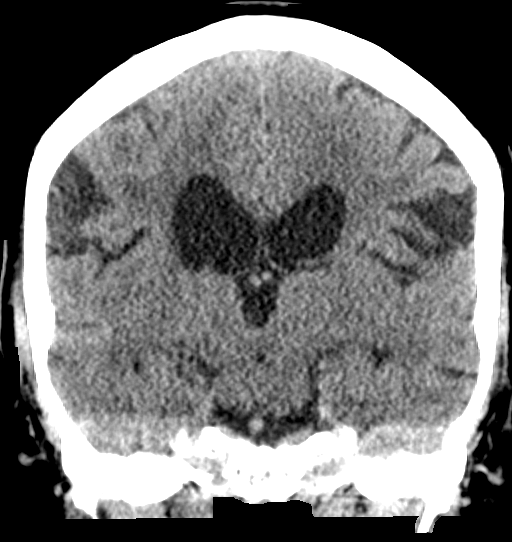
[im 38/68  brain]
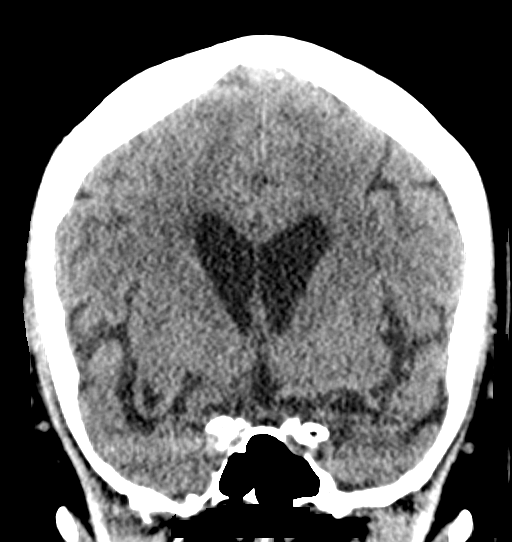

[Series 5: sagittal soft tissue · sagittal · 0.31mm/px · 3 of 51 slices shown]
[im 17/51  brain]
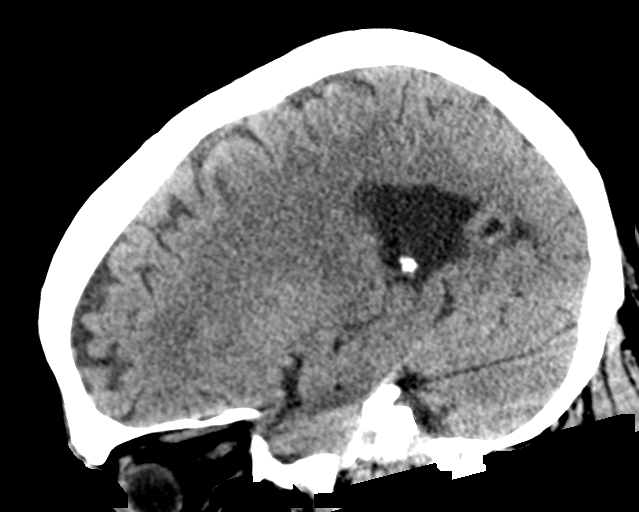
[im 26/51  brain]
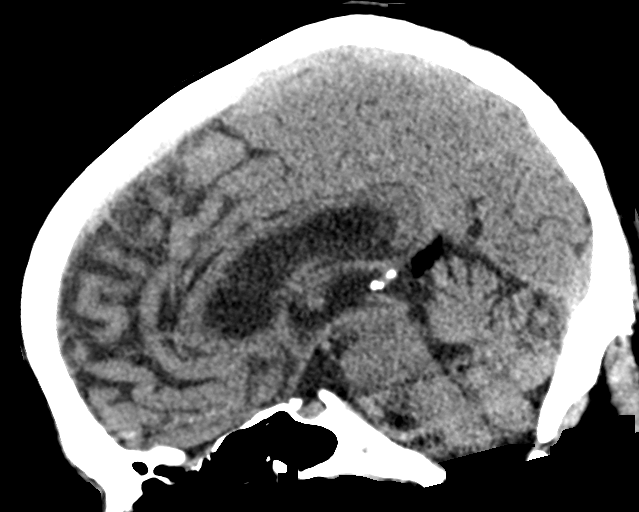
[im 34/51  brain]
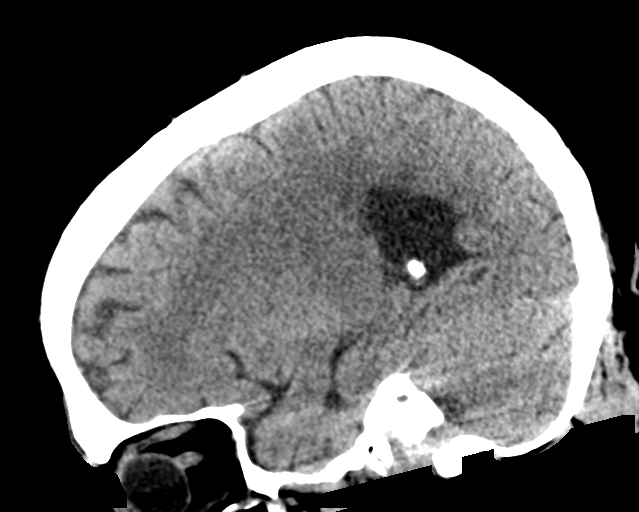

[14 of 47 positions shown; findings below may reference images not displayed]

FINDINGS: CT HEAD FINDINGS

Brain: Normal anatomic configuration. Parenchymal volume loss is
commensurate with the patient's age. Mild periventricular white
matter changes are present likely reflecting the sequela of small
vessel ischemia. No abnormal intra or extra-axial mass lesion or
fluid collection. No abnormal mass effect or midline shift. No
evidence of acute intracranial hemorrhage or infarct. Ventricular
size is normal. Cerebellum unremarkable.

Vascular: No asymmetric hyperdense vasculature at the skull base.

Skull: Intact. A circumscribed 2.2 cm lytic lesion is seen centered
within the right temporal diploic space with mild expansion of the
calvarium but no associated endosteal scalloping or cortical
breakthrough. There is a variable ground-glass matrix within this
lesion and, altogether, this is most compatible with focal fibrous
dysplasia of the calvarium. Less likely, this could reflect the
subacute phase of focal Paget's disease.

Sinuses/Orbits: Mild mucosal thickening within the right maxillary
sinus. Remaining paranasal sinuses are clear. Orbits are
unremarkable.

Other: Mastoid air cells and middle ear cavities are clear.

CT CERVICAL SPINE FINDINGS

Alignment: Normal.  No listhesis.

Skull base and vertebrae: Craniocervical alignment is normal.
Atlantal dental interval is normal. No acute fracture of the
cervical spine. No lytic or blastic bone lesion identified.

Soft tissues and spinal canal: There is congenital narrowing of the
spinal canal secondary to shortening of the pedicles. This in
combination with posterior disc osteophyte complex ease at C4-5 and
C5-6 results in moderate to severe central canal stenosis with an AP
diameter of 5-6 mm and marked flattening of the thecal sac. No canal
hematoma. No prevertebral soft tissue swelling. No paraspinal fluid
collections are identified.

Disc levels: Review of the sagittal reformats demonstrates mild
reversal of the normal cervical lordosis. There is intervertebral
disc space narrowing and endplate remodeling throughout the
visualized thoracolumbar spine in keeping with changes of diffuse
severe degenerative disc disease. Pursued oriented disc osteophytes
are noted throughout the cervical spine. As noted above, the spinal
canal is diffusely congenitally narrowed. The prevertebral soft
tissues are not thickened. Review of the axial images demonstrates
multilevel uncovertebral arthrosis resulting in multilevel moderate
to severe neural foraminal narrowing best noted bilaterally at C3-4,
C4-5, C5-6, and C6-7. Posterior disc osteophyte complex ease at
C4-5, C5-6, and C6-7 result in moderate to severe central canal
stenosis, most severe at C5-6 with marked flattening of the thecal
sac.

Upper chest: Visualized lung apices are unremarkable

Other: Moderate atherosclerotic calcification is seen within the
left carotid bifurcation. Mild atherosclerotic calcification is seen
within the aortic arch.
IMPRESSION: No acute intracranial injury.  No calvarial fracture.

2.2 cm mixed lytic and sclerotic calvarial lesion most in keeping
with focal fibrous dysplasia.

No acute fracture or listhesis of the cervical spine.

Advanced diffuse degenerative disc disease and degenerative joint
disease throughout the cervical spine. Resultant multilevel moderate
to severe neural foraminal narrowing and moderate to severe central
canal stenosis as described above.

## 2022-04-17 DIAGNOSIS — E119 Type 2 diabetes mellitus without complications: Secondary | ICD-10-CM | POA: Diagnosis not present

## 2022-04-18 DIAGNOSIS — G4733 Obstructive sleep apnea (adult) (pediatric): Secondary | ICD-10-CM | POA: Diagnosis not present

## 2022-04-28 ENCOUNTER — Other Ambulatory Visit: Payer: Self-pay | Admitting: Cardiovascular Disease

## 2022-05-07 NOTE — Progress Notes (Unsigned)
Cardiology Office Note  Date:  05/08/2022   ID:  Clinton Dorsey, DOB 1951/02/08, MRN [id number]  PCP:  [name], MD   Chief Complaint  Patient presents with   6 month follow up     Patient c/o decrease in heart beats and shortness of breath when working outside and tired more than usual. Medications reviewed by the patient verbally.     HPI:  [name] is a very pleasant 71 year-old gentleman with a history of hyperlipidemia,  hypertension,  diabetes, HBA1C 6.5 prior occasional tobacco abuse , quit presented to [facility] 12/07/2013 with chest pain. non-ST elevation MI  Stents to LCX, diagonal, distal RCA (staged) Echo 10/2015: normal EF, RVSP 40, mod MR PAF anxiety He presents today for follow-up of his coronary artery disease, atrial fib following recent ablation  LOV February 2023 Active with new house, lots of mowing and gardening Less exercise, eating out more, some family stressors Weight has been trending upwards, 12 pounds  Having some worsening fatigue, mild shortness of breath Wife confirms he has more fatigue Feels he is sleeping well OSA on CPAP  Rare afib on carvedilol and diltiazem  Lasb reviewed Total chol 131 LDL 68 A1C 6.5  Prior work-up reviewed renal artery ultrasound negative, no stenosis Serum aldosterone/renin test  Aldosterone is normal while renin is normal but on the low end of normal.  The aldosterone to renin ratio is elevated.   24-hour urine collection within normal range  Other past medical history reviewed Stress at home, niece committed suicide, ETOH Some financial stressors, daughter in a divorce, he is having to support her, some bad debt on her part  Ablation for atrial fibrillation, 01/16/2021  Lab work reviewed with him HBA1C 6.8  total chol 111, LDL 55  emergency room July 20, 2019 for palpitations Started 8:30 in the morning watch told him he was in atrial fibrillation EKG in the emergency room showing atrial  fibrillation Plavix was held Changed to Eliquis 5 twice daily Amlodipine changed to diltiazem Converted overnight to normal sinus rhythm   Previous Holter and stress test, Holter did show APCs, PVCs, idioventricular escape complexes, poor short run of SVT  Myoview stress test showing no ischemia   Cardiac catheterization  severe three-vessel CAD. He had stent for distal RCA/ostial PL branch disease estimated at 99%. He also had mid circumflex with 99% disease, severe diagonal #1 disease. Attempt to place a stent to the mid circumflex was unsuccessful and given that he had significant dye and radiation exposure, it was recommended that he have PCI of his left circumflex and diagonal vessel at a later date. Followup catheterization at [facility] with successful stents to his mid circumflex and first diagonal vessel    echocardiogram in the hospital showed ejection fraction 50-55%, mildly dilated left atrium, mild TR, low to moderate MR, no focal wall motion abnormalities noted    PMH:   has a past medical history of Coronary artery disease, Depression, Diabetes mellitus (HCC), History of echocardiogram, Hyperlipidemia, Hypertension, Hypokalemia, Kidney stones, MI (myocardial infarction) (HCC) (11/2013), Mitral regurgitation, Obesity, Persistent atrial fibrillation (HCC), and Tobacco abuse.  PSH:    Past Surgical History:  Procedure Laterality Date   ATRIAL FIBRILLATION ABLATION N/A 01/16/2021   Procedure: ATRIAL FIBRILLATION ABLATION;  Surgeon: Lanier Prude, MD;  Location: [facility] IN[facility];  Service: Cardiovascular;  Laterality: N/A;   CARDIAC CATHETERIZATION  12/08/2013   CARDIOVERSION N/A 09/22/2020   Procedure: CARDIOVERSION;  Surgeon: Antonieta Iba,  MD;  Location: [facility] ORS;  Service: Cardiovascular;  Laterality: N/A;   CARDIOVERSION N/A 11/08/2020   Procedure: CARDIOVERSION;  Surgeon: Yvonne Kendall, MD;  Location: [facility] ORS;  Service: Cardiovascular;  Laterality: N/A;    CORONARY ANGIOPLASTY  12/08/2013   s/p stent placement   HERNIA REPAIR     LAMINECTOMY     PERCUTANEOUS CORONARY STENT INTERVENTION (PCI-S) N/A 12/23/2013   Procedure: PERCUTANEOUS CORONARY STENT INTERVENTION (PCI-S);  Surgeon: [name], MD;  Location: [facility] CATH LAB;  Service: Cardiovascular;  Laterality: N/A;   SPINE SURGERY  2014   TONSILLECTOMY      Current Outpatient Medications  Medication Sig Dispense Refill   apixaban (ELIQUIS) 5 MG TABS tablet TAKE 1 TABLET(5 MG) BY MOUTH TWICE DAILY 180 tablet 1   buPROPion (WELLBUTRIN XL) 150 MG 24 hr tablet Take 1 tablet (150 mg total) by mouth daily. 30 tablet 0   carvedilol (COREG) 25 MG tablet Take 1 tablet (25 mg total) by mouth 2 (two) times daily. 180 tablet 3   cholecalciferol (VITAMIN D3) 25 MCG (1000 UNIT) tablet Take 1,000 Units by mouth daily.     Coenzyme Q10 (COQ10) 100 MG CAPS Take 100 mg by mouth daily.     diltiazem (CARDIZEM CD) 240 MG 24 hr capsule Take 1 capsule (240 mg total) by mouth daily. 90 capsule 3   ezetimibe (ZETIA) 10 MG tablet TAKE 1 TABLET(10 MG) BY MOUTH DAILY 90 tablet 3   Glucose Blood (BLOOD GLUCOSE TEST STRIPS) STRP 1 strip by In Vitro route daily. 100 each 12   hydrALAZINE (APRESOLINE) 100 MG tablet Take 1 tablet (100 mg total) by mouth 3 (three) times daily. 270 tablet 3   loratadine (CLARITIN) 10 MG tablet Take 10 mg by mouth daily as needed for allergies.     magnesium oxide (MAG-OX) 400 MG tablet Take 400 mg by mouth daily.     Melatonin 10 MG CAPS Take 10 mg by mouth at bedtime as needed (sleep).     metFORMIN (GLUCOPHAGE) 500 MG tablet Take 500 mg by mouth daily with supper.     Multiple Vitamin (MULTIVITAMIN) capsule Take 1 capsule by mouth daily.     potassium chloride SA (KLOR-CON M) 20 MEQ tablet TAKE 1 TABLET(20 MEQ) BY MOUTH DAILY 90 tablet 3   rosuvastatin (CRESTOR) 20 MG tablet TAKE 1 TABLET(20 MG) BY MOUTH DAILY 90 tablet 3   sildenafil (REVATIO) 20 MG tablet Take 1 tablet (20 mg total) by  mouth 3 (three) times daily as needed. 90 tablet 3   telmisartan (MICARDIS) 40 MG tablet TAKE 2 TABLETS BY MOUTH DAILY 180 tablet 0   No current facility-administered medications for this visit.     Allergies:   Spironolactone, Penicillins, Amlodipine, Atorvastatin, and Simvastatin   Social History:  The patient  reports that he quit smoking about 8 years ago. His smoking use included cigars. He has never used smokeless tobacco. He reports current alcohol use of about 1.0 standard drink of alcohol per week. He reports that he does not use drugs.   Family History:   family history includes Heart attack (age of onset: 30) in his father; Heart disease in his father; Hypertension in his mother and sister.    Review of Systems: Review of Systems  Constitutional: Negative.   HENT: Negative.    Respiratory: Negative.    Cardiovascular: Negative.   Gastrointestinal: Negative.   Musculoskeletal: Negative.   Neurological: Negative.   Psychiatric/Behavioral: Negative.    All  other systems reviewed and are negative.   PHYSICAL EXAM: VS:  BP (!) 140/70 (BP Location: Left Arm, Patient Position: Sitting, Cuff Size: Normal)   Pulse (!) 56   Ht 5\' 6"  (1.676 m)   Wt 182 lb 4 oz (82.7 kg)   SpO2 98%   BMI 29.42 kg/m  , BMI Body mass index is 29.42 kg/m. Constitutional:  oriented to person, place, and time. No distress.  HENT:  Head: Grossly normal Eyes:  no discharge. No scleral icterus.  Neck: No JVD, no carotid bruits  Cardiovascular: Regular rate and rhythm, no murmurs appreciated Pulmonary/Chest: Clear to auscultation bilaterally, no wheezes or rails Abdominal: Soft.  no distension.  no tenderness.  Musculoskeletal: Normal range of motion Neurological:  normal muscle tone. Coordination normal. No atrophy Skin: Skin warm and dry Psychiatric: normal affect, pleasant  Recent Labs: No results found for requested labs within last 365 days.    Lipid Panel Lab Results  Component  Value Date   CHOL 114 09/29/2020   HDL 38 (L) 09/29/2020   LDLCALC 57 09/29/2020   TRIG 102 09/29/2020    Wt Readings from Last 3 Encounters:  05/08/22 182 lb 4 oz (82.7 kg)  11/07/21 180 lb (81.6 kg)  07/19/21 177 lb 4 oz (80.4 kg)     ASSESSMENT AND PLAN:  Atherosclerosis of native coronary artery of native heart with stable angina pectoris (HCC) - Currently with no symptoms of angina. No further workup at this time. Continue current medication regimen. For any worsening shortness of breath or chest pain, Myoview would be ordered  Essential hypertension - Plan: EKG 12-Lead Blood pressure mildly elevated on carvedilol 25 twice daily, diltiazem ER up to 240 daily,  Previously stopped Cardura secondary to side effects Recommend he closely monitor blood pressure at home  Paroxysmal atrial fibrillation Recent ablation by EP, maintaining normal sinus rhythm Denies symptoms concerning for atrial fibrillation  Mixed hyperlipidemia - Plan: EKG 12-Lead on Crestor Zetia, numbers at goal Weight loss recommended  Poorly controlled type 2 diabetes mellitus with circulatory disorder (HCC) - Recommended low carbohydrate diet, walking program  Tobacco abuse -  Previously stopped smoking   Total encounter time more than 30 minutes  Greater than 50% was spent in counseling and coordination of care with the patient     Orders Placed This Encounter  Procedures   EKG 12-Lead     Signed, 13/02/22, M.D., Ph.D. 05/08/2022  [facility] [place], [place]

## 2022-05-08 ENCOUNTER — Ambulatory Visit (INDEPENDENT_AMBULATORY_CARE_PROVIDER_SITE_OTHER): Payer: BC Managed Care – PPO | Admitting: Cardiovascular Disease

## 2022-05-08 ENCOUNTER — Encounter: Payer: Self-pay | Admitting: Cardiovascular Disease

## 2022-05-08 VITALS — BP 140/70 | HR 56 | Ht 66.0 in | Wt 182.2 lb

## 2022-05-08 DIAGNOSIS — E1169 Type 2 diabetes mellitus with other specified complication: Secondary | ICD-10-CM

## 2022-05-08 DIAGNOSIS — I340 Nonrheumatic mitral (valve) insufficiency: Secondary | ICD-10-CM | POA: Diagnosis not present

## 2022-05-08 DIAGNOSIS — I25118 Atherosclerotic heart disease of native coronary artery with other forms of angina pectoris: Secondary | ICD-10-CM | POA: Diagnosis not present

## 2022-05-08 DIAGNOSIS — I4819 Other persistent atrial fibrillation: Secondary | ICD-10-CM | POA: Diagnosis not present

## 2022-05-08 DIAGNOSIS — I10 Essential (primary) hypertension: Secondary | ICD-10-CM | POA: Diagnosis not present

## 2022-05-08 DIAGNOSIS — G4733 Obstructive sleep apnea (adult) (pediatric): Secondary | ICD-10-CM

## 2022-05-08 DIAGNOSIS — E785 Hyperlipidemia, unspecified: Secondary | ICD-10-CM

## 2022-05-08 MED ORDER — TELMISARTAN 40 MG PO TABS: 80.0000 mg | ORAL_TABLET | Freq: Every day | ORAL | 3 refills | Status: AC

## 2022-05-08 MED ORDER — CARVEDILOL 25 MG PO TABS: 25.0000 mg | ORAL_TABLET | Freq: Two times a day (BID) | ORAL | 3 refills | Status: AC

## 2022-05-08 MED ORDER — HYDRALAZINE HCL 100 MG PO TABS: 100.0000 mg | ORAL_TABLET | Freq: Three times a day (TID) | ORAL | 3 refills | Status: AC

## 2022-05-08 MED ORDER — ROSUVASTATIN CALCIUM 20 MG PO TABS: ORAL_TABLET | ORAL | 3 refills | Status: DC

## 2022-05-08 MED ORDER — DILTIAZEM HCL ER COATED BEADS 240 MG PO CP24: 240.0000 mg | ORAL_CAPSULE | Freq: Every day | ORAL | 3 refills | Status: AC

## 2022-05-08 NOTE — Patient Instructions (Addendum)
Medication Instructions:  No changes  If you need a refill on your cardiac medications before your next appointment, please call your pharmacy.   Lab work: No new labs needed  Testing/Procedures: No new testing needed  Follow-Up: At [facility], you and your health needs are our priority.  As part of our continuing mission to provide you with exceptional heart care, we have created designated Provider Care Teams.  These Care Teams include your primary Cardiologist (physician) and Advanced Practice Providers (APPs -  Physician Assistants and Nurse Practitioners) who all work together to provide you with the care you need, when you need it.  You will need a follow up appointment in 12 months  Providers on your designated Care Team:   Christopher Berge, NP Ryan Dunn, PA-C Cadence Furth, PA-C  COVID-19 Vaccine Information can be found at: [email or web address] For questions related to vaccine distribution or appointments, please email [email or web address] or call [phone].   

## 2022-05-14 DIAGNOSIS — G4733 Obstructive sleep apnea (adult) (pediatric): Secondary | ICD-10-CM | POA: Diagnosis not present

## 2022-05-18 DIAGNOSIS — E119 Type 2 diabetes mellitus without complications: Secondary | ICD-10-CM | POA: Diagnosis not present

## 2022-05-31 ENCOUNTER — Telehealth: Payer: Self-pay | Admitting: Cardiovascular Disease

## 2022-05-31 NOTE — Telephone Encounter (Signed)
I spoke with the pt and he says his wife tested positive for COVID a few days ago and he has taken a test but no results yet but feels certain it is COVID.Clinton Dorsey. he has congestion, headache, and does not feel well... no fever.   I advised him that it is viral and antibiotics are not helpful unless he has had congestion for several days and his sputum turns yellow but then he would need to see someone at an Urgent care possibly... he will continue to rest and hydrate and treat his symptoms...  He is asking if Dr Mariah Milling recommends Paxlovid.Clinton KitchenMarland Dorsey I will forward to him for his review.

## 2022-05-31 NOTE — Telephone Encounter (Signed)
New Message:     Patient says he have moved to [place], [place]. Patient says he have Covid. He says his problem is does not have a primary doctor in [place] yet. He wants to know if Dr Mariah Milling would call in the Antibiotic for Covid for him please?

## 2022-06-06 NOTE — Telephone Encounter (Signed)
Spoke with patient who reports he did not test positive for covid.

## 2022-06-14 DIAGNOSIS — G4733 Obstructive sleep apnea (adult) (pediatric): Secondary | ICD-10-CM | POA: Diagnosis not present

## 2022-06-17 DIAGNOSIS — E119 Type 2 diabetes mellitus without complications: Secondary | ICD-10-CM | POA: Diagnosis not present

## 2022-07-04 ENCOUNTER — Ambulatory Visit: Payer: BC Managed Care – PPO | Admitting: Cardiology

## 2022-07-10 IMAGING — DX DG CHEST 1V
1 series · 1 of 1 positions shown · non-contrast
Comparison: 07/29/2020

CLINICAL DATA: Weakness

Atrial fibrillation with RVR
EXAM:
CHEST  1 VIEW

[chest ap]
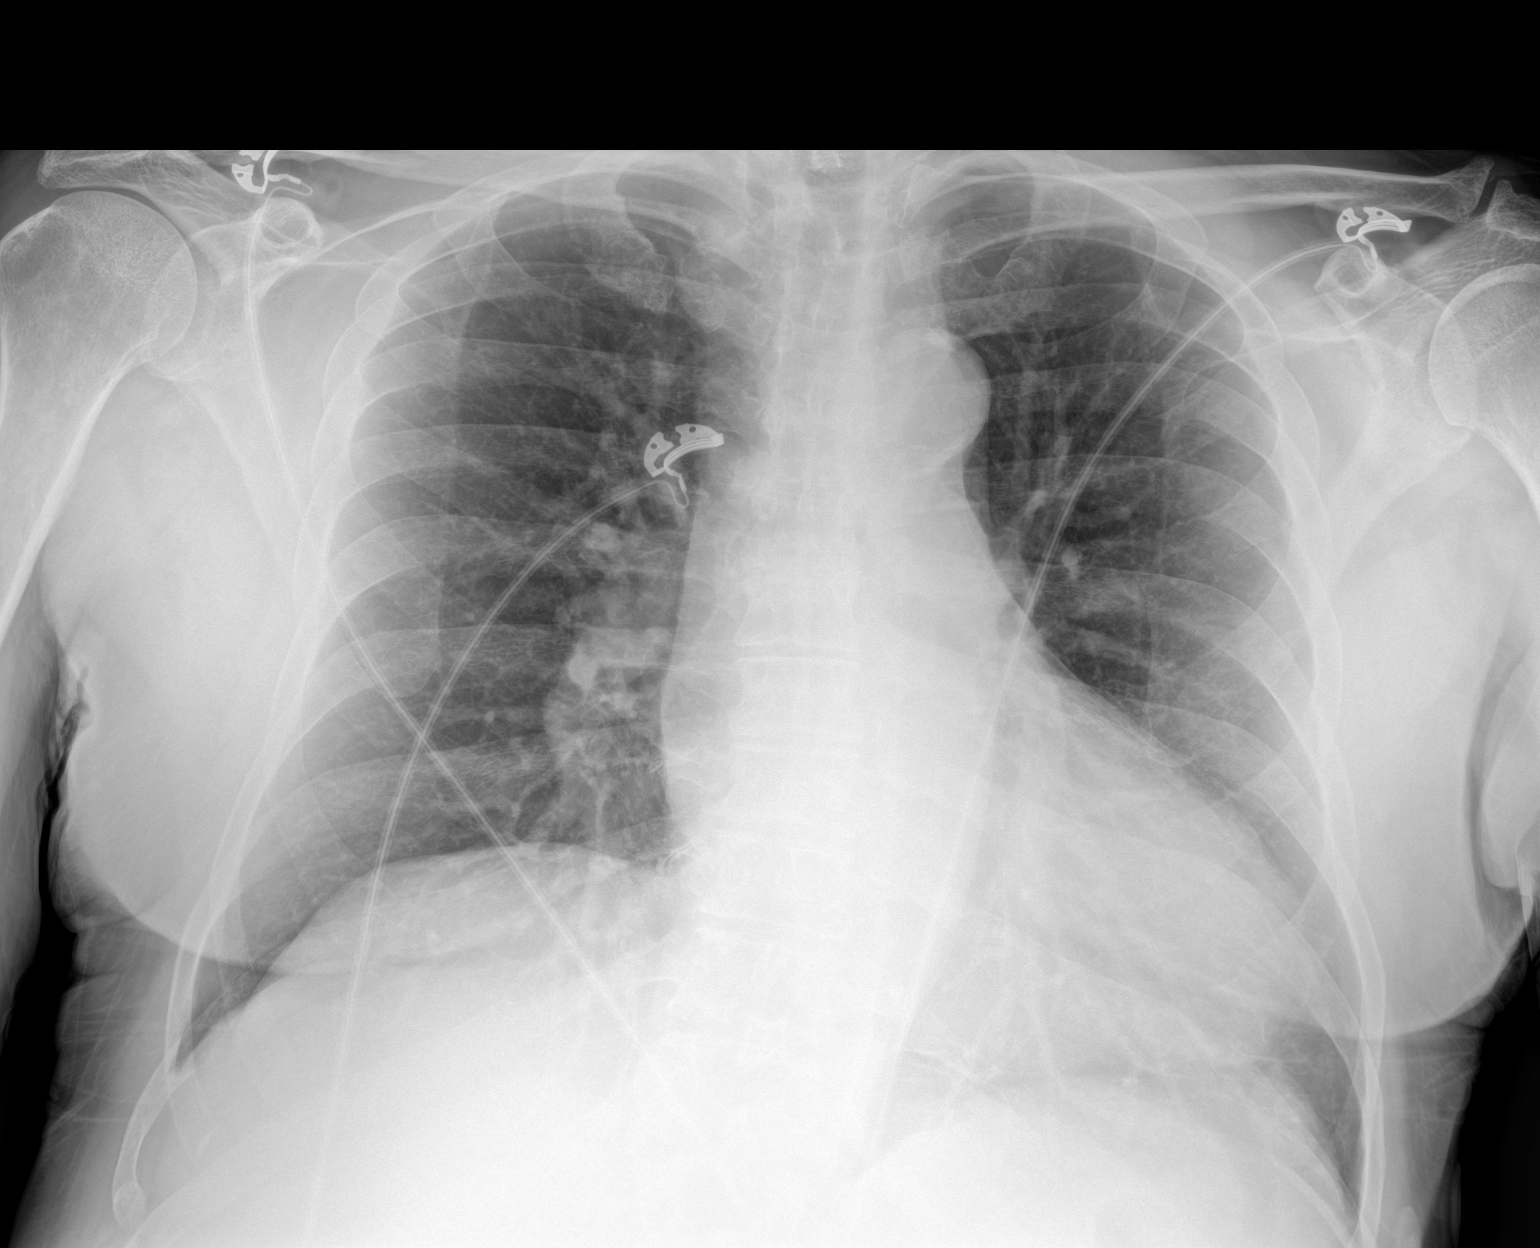

[1 of 1 positions shown; findings below may reference images not displayed]

FINDINGS: The heart size and mediastinal contours are within normal limits.
Both lungs are clear. The visualized skeletal structures are
unremarkable.
IMPRESSION: No acute cardiopulmonary process.

## 2022-07-14 DIAGNOSIS — G4733 Obstructive sleep apnea (adult) (pediatric): Secondary | ICD-10-CM | POA: Diagnosis not present

## 2022-07-18 DIAGNOSIS — E119 Type 2 diabetes mellitus without complications: Secondary | ICD-10-CM | POA: Diagnosis not present

## 2022-07-19 DIAGNOSIS — I10 Essential (primary) hypertension: Secondary | ICD-10-CM | POA: Diagnosis not present

## 2022-07-19 DIAGNOSIS — E782 Mixed hyperlipidemia: Secondary | ICD-10-CM | POA: Diagnosis not present

## 2022-07-19 DIAGNOSIS — I480 Paroxysmal atrial fibrillation: Secondary | ICD-10-CM | POA: Diagnosis not present

## 2022-07-19 DIAGNOSIS — I2510 Atherosclerotic heart disease of native coronary artery without angina pectoris: Secondary | ICD-10-CM | POA: Diagnosis not present

## 2022-08-17 DIAGNOSIS — E119 Type 2 diabetes mellitus without complications: Secondary | ICD-10-CM | POA: Diagnosis not present

## 2022-11-26 DIAGNOSIS — I480 Paroxysmal atrial fibrillation: Secondary | ICD-10-CM | POA: Diagnosis not present

## 2022-11-26 DIAGNOSIS — I10 Essential (primary) hypertension: Secondary | ICD-10-CM | POA: Diagnosis not present

## 2022-11-26 DIAGNOSIS — I2510 Atherosclerotic heart disease of native coronary artery without angina pectoris: Secondary | ICD-10-CM | POA: Diagnosis not present

## 2022-11-29 DIAGNOSIS — G4733 Obstructive sleep apnea (adult) (pediatric): Secondary | ICD-10-CM | POA: Diagnosis not present

## 2022-12-11 DIAGNOSIS — I2510 Atherosclerotic heart disease of native coronary artery without angina pectoris: Secondary | ICD-10-CM | POA: Diagnosis not present

## 2022-12-11 DIAGNOSIS — I10 Essential (primary) hypertension: Secondary | ICD-10-CM | POA: Diagnosis not present

## 2022-12-11 DIAGNOSIS — I480 Paroxysmal atrial fibrillation: Secondary | ICD-10-CM | POA: Diagnosis not present

## 2022-12-17 DIAGNOSIS — I10 Essential (primary) hypertension: Secondary | ICD-10-CM | POA: Diagnosis not present

## 2022-12-17 DIAGNOSIS — I4811 Longstanding persistent atrial fibrillation: Secondary | ICD-10-CM | POA: Diagnosis not present

## 2022-12-17 DIAGNOSIS — I2510 Atherosclerotic heart disease of native coronary artery without angina pectoris: Secondary | ICD-10-CM | POA: Diagnosis not present

## 2022-12-17 DIAGNOSIS — E119 Type 2 diabetes mellitus without complications: Secondary | ICD-10-CM | POA: Diagnosis not present

## 2022-12-26 ENCOUNTER — Other Ambulatory Visit: Payer: Self-pay | Admitting: Cardiovascular Disease

## 2022-12-27 DIAGNOSIS — I2510 Atherosclerotic heart disease of native coronary artery without angina pectoris: Secondary | ICD-10-CM | POA: Diagnosis not present

## 2022-12-27 DIAGNOSIS — I480 Paroxysmal atrial fibrillation: Secondary | ICD-10-CM | POA: Diagnosis not present

## 2022-12-27 DIAGNOSIS — I10 Essential (primary) hypertension: Secondary | ICD-10-CM | POA: Diagnosis not present

## 2022-12-30 DIAGNOSIS — G4733 Obstructive sleep apnea (adult) (pediatric): Secondary | ICD-10-CM | POA: Diagnosis not present

## 2023-01-12 ENCOUNTER — Other Ambulatory Visit: Payer: Self-pay | Admitting: Cardiovascular Disease

## 2023-01-29 DIAGNOSIS — G4733 Obstructive sleep apnea (adult) (pediatric): Secondary | ICD-10-CM | POA: Diagnosis not present

## 2023-02-07 DIAGNOSIS — M2550 Pain in unspecified joint: Secondary | ICD-10-CM | POA: Diagnosis not present

## 2023-02-07 DIAGNOSIS — E119 Type 2 diabetes mellitus without complications: Secondary | ICD-10-CM | POA: Diagnosis not present

## 2023-02-07 DIAGNOSIS — I480 Paroxysmal atrial fibrillation: Secondary | ICD-10-CM | POA: Diagnosis not present

## 2023-02-07 DIAGNOSIS — I10 Essential (primary) hypertension: Secondary | ICD-10-CM | POA: Diagnosis not present

## 2023-03-13 DIAGNOSIS — F419 Anxiety disorder, unspecified: Secondary | ICD-10-CM | POA: Diagnosis not present

## 2023-03-13 DIAGNOSIS — E119 Type 2 diabetes mellitus without complications: Secondary | ICD-10-CM | POA: Diagnosis not present

## 2023-03-13 DIAGNOSIS — I480 Paroxysmal atrial fibrillation: Secondary | ICD-10-CM | POA: Diagnosis not present

## 2023-03-13 DIAGNOSIS — I10 Essential (primary) hypertension: Secondary | ICD-10-CM | POA: Diagnosis not present

## 2023-03-27 DIAGNOSIS — I10 Essential (primary) hypertension: Secondary | ICD-10-CM | POA: Diagnosis not present

## 2023-03-27 DIAGNOSIS — E119 Type 2 diabetes mellitus without complications: Secondary | ICD-10-CM | POA: Diagnosis not present

## 2023-05-08 DIAGNOSIS — Z79899 Other long term (current) drug therapy: Secondary | ICD-10-CM | POA: Diagnosis not present

## 2023-05-08 DIAGNOSIS — F419 Anxiety disorder, unspecified: Secondary | ICD-10-CM | POA: Diagnosis not present

## 2023-05-08 DIAGNOSIS — I10 Essential (primary) hypertension: Secondary | ICD-10-CM | POA: Diagnosis not present

## 2023-05-08 DIAGNOSIS — E119 Type 2 diabetes mellitus without complications: Secondary | ICD-10-CM | POA: Diagnosis not present

## 2023-05-27 DIAGNOSIS — G4733 Obstructive sleep apnea (adult) (pediatric): Secondary | ICD-10-CM | POA: Diagnosis not present

## 2023-06-10 DIAGNOSIS — E860 Dehydration: Secondary | ICD-10-CM | POA: Diagnosis not present

## 2023-06-10 DIAGNOSIS — R42 Dizziness and giddiness: Secondary | ICD-10-CM | POA: Diagnosis not present

## 2023-06-11 DIAGNOSIS — M899 Disorder of bone, unspecified: Secondary | ICD-10-CM | POA: Diagnosis not present

## 2023-06-11 DIAGNOSIS — E785 Hyperlipidemia, unspecified: Secondary | ICD-10-CM | POA: Diagnosis not present

## 2023-06-11 DIAGNOSIS — E876 Hypokalemia: Secondary | ICD-10-CM | POA: Diagnosis not present

## 2023-06-11 DIAGNOSIS — E119 Type 2 diabetes mellitus without complications: Secondary | ICD-10-CM | POA: Diagnosis not present

## 2023-06-11 DIAGNOSIS — I161 Hypertensive emergency: Secondary | ICD-10-CM | POA: Diagnosis not present

## 2023-06-11 DIAGNOSIS — I4811 Longstanding persistent atrial fibrillation: Secondary | ICD-10-CM | POA: Diagnosis not present

## 2023-06-11 DIAGNOSIS — M6281 Muscle weakness (generalized): Secondary | ICD-10-CM | POA: Diagnosis not present

## 2023-06-11 DIAGNOSIS — N200 Calculus of kidney: Secondary | ICD-10-CM | POA: Diagnosis not present

## 2023-06-11 DIAGNOSIS — E860 Dehydration: Secondary | ICD-10-CM | POA: Diagnosis not present

## 2023-06-11 DIAGNOSIS — R4182 Altered mental status, unspecified: Secondary | ICD-10-CM | POA: Diagnosis not present

## 2023-06-11 DIAGNOSIS — R410 Disorientation, unspecified: Secondary | ICD-10-CM | POA: Diagnosis not present

## 2023-06-11 DIAGNOSIS — G8191 Hemiplegia, unspecified affecting right dominant side: Secondary | ICD-10-CM | POA: Diagnosis not present

## 2023-06-11 DIAGNOSIS — I2510 Atherosclerotic heart disease of native coronary artery without angina pectoris: Secondary | ICD-10-CM | POA: Diagnosis not present

## 2023-06-11 DIAGNOSIS — I169 Hypertensive crisis, unspecified: Secondary | ICD-10-CM | POA: Diagnosis not present

## 2023-06-11 DIAGNOSIS — I701 Atherosclerosis of renal artery: Secondary | ICD-10-CM | POA: Diagnosis not present

## 2023-06-11 DIAGNOSIS — I1A0 Resistant hypertension: Secondary | ICD-10-CM | POA: Diagnosis not present

## 2023-06-11 DIAGNOSIS — I6522 Occlusion and stenosis of left carotid artery: Secondary | ICD-10-CM | POA: Diagnosis not present

## 2023-06-11 DIAGNOSIS — R0789 Other chest pain: Secondary | ICD-10-CM | POA: Diagnosis not present

## 2023-06-11 DIAGNOSIS — G936 Cerebral edema: Secondary | ICD-10-CM | POA: Diagnosis not present

## 2023-06-11 DIAGNOSIS — R531 Weakness: Secondary | ICD-10-CM | POA: Diagnosis not present

## 2023-06-11 DIAGNOSIS — K5790 Diverticulosis of intestine, part unspecified, without perforation or abscess without bleeding: Secondary | ICD-10-CM | POA: Diagnosis not present

## 2023-06-11 DIAGNOSIS — K5730 Diverticulosis of large intestine without perforation or abscess without bleeding: Secondary | ICD-10-CM | POA: Diagnosis not present

## 2023-06-11 DIAGNOSIS — I639 Cerebral infarction, unspecified: Secondary | ICD-10-CM | POA: Diagnosis not present

## 2023-06-11 DIAGNOSIS — I610 Nontraumatic intracerebral hemorrhage in hemisphere, subcortical: Secondary | ICD-10-CM | POA: Diagnosis not present

## 2023-06-11 DIAGNOSIS — I619 Nontraumatic intracerebral hemorrhage, unspecified: Secondary | ICD-10-CM | POA: Diagnosis not present

## 2023-06-11 DIAGNOSIS — I10 Essential (primary) hypertension: Secondary | ICD-10-CM | POA: Diagnosis not present

## 2023-06-11 DIAGNOSIS — I6789 Other cerebrovascular disease: Secondary | ICD-10-CM | POA: Diagnosis not present

## 2023-06-11 DIAGNOSIS — I4891 Unspecified atrial fibrillation: Secondary | ICD-10-CM | POA: Diagnosis not present

## 2023-06-11 DIAGNOSIS — R42 Dizziness and giddiness: Secondary | ICD-10-CM | POA: Diagnosis not present

## 2023-06-11 DIAGNOSIS — I618 Other nontraumatic intracerebral hemorrhage: Secondary | ICD-10-CM | POA: Diagnosis not present

## 2023-06-12 DIAGNOSIS — I610 Nontraumatic intracerebral hemorrhage in hemisphere, subcortical: Secondary | ICD-10-CM | POA: Diagnosis not present

## 2023-06-12 DIAGNOSIS — I169 Hypertensive crisis, unspecified: Secondary | ICD-10-CM | POA: Diagnosis not present

## 2023-06-12 DIAGNOSIS — I4811 Longstanding persistent atrial fibrillation: Secondary | ICD-10-CM | POA: Diagnosis not present

## 2023-06-13 DIAGNOSIS — I610 Nontraumatic intracerebral hemorrhage in hemisphere, subcortical: Secondary | ICD-10-CM | POA: Diagnosis not present

## 2023-06-13 DIAGNOSIS — R531 Weakness: Secondary | ICD-10-CM | POA: Diagnosis not present

## 2023-06-13 DIAGNOSIS — I169 Hypertensive crisis, unspecified: Secondary | ICD-10-CM | POA: Diagnosis not present

## 2023-06-13 DIAGNOSIS — R42 Dizziness and giddiness: Secondary | ICD-10-CM | POA: Diagnosis not present

## 2023-06-13 DIAGNOSIS — R410 Disorientation, unspecified: Secondary | ICD-10-CM | POA: Diagnosis not present

## 2023-06-13 DIAGNOSIS — I10 Essential (primary) hypertension: Secondary | ICD-10-CM | POA: Diagnosis not present

## 2023-06-13 DIAGNOSIS — I4811 Longstanding persistent atrial fibrillation: Secondary | ICD-10-CM | POA: Diagnosis not present

## 2023-06-13 DIAGNOSIS — I6789 Other cerebrovascular disease: Secondary | ICD-10-CM | POA: Diagnosis not present

## 2023-06-14 DIAGNOSIS — K5790 Diverticulosis of intestine, part unspecified, without perforation or abscess without bleeding: Secondary | ICD-10-CM | POA: Diagnosis not present

## 2023-06-14 DIAGNOSIS — N200 Calculus of kidney: Secondary | ICD-10-CM | POA: Diagnosis not present

## 2023-06-14 DIAGNOSIS — I2510 Atherosclerotic heart disease of native coronary artery without angina pectoris: Secondary | ICD-10-CM | POA: Diagnosis not present

## 2023-06-19 DIAGNOSIS — D164 Benign neoplasm of bones of skull and face: Secondary | ICD-10-CM | POA: Diagnosis not present

## 2023-06-19 DIAGNOSIS — I252 Old myocardial infarction: Secondary | ICD-10-CM | POA: Diagnosis not present

## 2023-06-19 DIAGNOSIS — Z955 Presence of coronary angioplasty implant and graft: Secondary | ICD-10-CM | POA: Diagnosis not present

## 2023-06-19 DIAGNOSIS — E119 Type 2 diabetes mellitus without complications: Secondary | ICD-10-CM | POA: Diagnosis not present

## 2023-06-19 DIAGNOSIS — I10 Essential (primary) hypertension: Secondary | ICD-10-CM | POA: Diagnosis not present

## 2023-06-19 DIAGNOSIS — Z87891 Personal history of nicotine dependence: Secondary | ICD-10-CM | POA: Diagnosis not present

## 2023-06-19 DIAGNOSIS — Z7984 Long term (current) use of oral hypoglycemic drugs: Secondary | ICD-10-CM | POA: Diagnosis not present

## 2023-06-19 DIAGNOSIS — I69151 Hemiplegia and hemiparesis following nontraumatic intracerebral hemorrhage affecting right dominant side: Secondary | ICD-10-CM | POA: Diagnosis not present

## 2023-06-19 DIAGNOSIS — I69193 Ataxia following nontraumatic intracerebral hemorrhage: Secondary | ICD-10-CM | POA: Diagnosis not present

## 2023-06-19 DIAGNOSIS — E876 Hypokalemia: Secondary | ICD-10-CM | POA: Diagnosis not present

## 2023-06-19 DIAGNOSIS — G936 Cerebral edema: Secondary | ICD-10-CM | POA: Diagnosis not present

## 2023-06-19 DIAGNOSIS — I2510 Atherosclerotic heart disease of native coronary artery without angina pectoris: Secondary | ICD-10-CM | POA: Diagnosis not present

## 2023-06-19 DIAGNOSIS — I4891 Unspecified atrial fibrillation: Secondary | ICD-10-CM | POA: Diagnosis not present

## 2023-06-19 DIAGNOSIS — Z7985 Long-term (current) use of injectable non-insulin antidiabetic drugs: Secondary | ICD-10-CM | POA: Diagnosis not present

## 2023-06-19 DIAGNOSIS — E785 Hyperlipidemia, unspecified: Secondary | ICD-10-CM | POA: Diagnosis not present

## 2023-06-20 DIAGNOSIS — D164 Benign neoplasm of bones of skull and face: Secondary | ICD-10-CM | POA: Diagnosis not present

## 2023-06-20 DIAGNOSIS — G936 Cerebral edema: Secondary | ICD-10-CM | POA: Diagnosis not present

## 2023-06-20 DIAGNOSIS — Z955 Presence of coronary angioplasty implant and graft: Secondary | ICD-10-CM | POA: Diagnosis not present

## 2023-06-20 DIAGNOSIS — I69193 Ataxia following nontraumatic intracerebral hemorrhage: Secondary | ICD-10-CM | POA: Diagnosis not present

## 2023-06-20 DIAGNOSIS — M899 Disorder of bone, unspecified: Secondary | ICD-10-CM | POA: Diagnosis not present

## 2023-06-20 DIAGNOSIS — I252 Old myocardial infarction: Secondary | ICD-10-CM | POA: Diagnosis not present

## 2023-06-20 DIAGNOSIS — I10 Essential (primary) hypertension: Secondary | ICD-10-CM | POA: Diagnosis not present

## 2023-06-20 DIAGNOSIS — E119 Type 2 diabetes mellitus without complications: Secondary | ICD-10-CM | POA: Diagnosis not present

## 2023-06-20 DIAGNOSIS — Z7984 Long term (current) use of oral hypoglycemic drugs: Secondary | ICD-10-CM | POA: Diagnosis not present

## 2023-06-20 DIAGNOSIS — I2510 Atherosclerotic heart disease of native coronary artery without angina pectoris: Secondary | ICD-10-CM | POA: Diagnosis not present

## 2023-06-20 DIAGNOSIS — E785 Hyperlipidemia, unspecified: Secondary | ICD-10-CM | POA: Diagnosis not present

## 2023-06-20 DIAGNOSIS — E876 Hypokalemia: Secondary | ICD-10-CM | POA: Diagnosis not present

## 2023-06-20 DIAGNOSIS — Z7985 Long-term (current) use of injectable non-insulin antidiabetic drugs: Secondary | ICD-10-CM | POA: Diagnosis not present

## 2023-06-20 DIAGNOSIS — I4891 Unspecified atrial fibrillation: Secondary | ICD-10-CM | POA: Diagnosis not present

## 2023-06-20 DIAGNOSIS — Z87891 Personal history of nicotine dependence: Secondary | ICD-10-CM | POA: Diagnosis not present

## 2023-06-20 DIAGNOSIS — I69151 Hemiplegia and hemiparesis following nontraumatic intracerebral hemorrhage affecting right dominant side: Secondary | ICD-10-CM | POA: Diagnosis not present

## 2023-06-24 DIAGNOSIS — I10 Essential (primary) hypertension: Secondary | ICD-10-CM | POA: Diagnosis not present

## 2023-06-24 DIAGNOSIS — Z7984 Long term (current) use of oral hypoglycemic drugs: Secondary | ICD-10-CM | POA: Diagnosis not present

## 2023-06-24 DIAGNOSIS — E876 Hypokalemia: Secondary | ICD-10-CM | POA: Diagnosis not present

## 2023-06-24 DIAGNOSIS — I252 Old myocardial infarction: Secondary | ICD-10-CM | POA: Diagnosis not present

## 2023-06-24 DIAGNOSIS — I69193 Ataxia following nontraumatic intracerebral hemorrhage: Secondary | ICD-10-CM | POA: Diagnosis not present

## 2023-06-24 DIAGNOSIS — I2510 Atherosclerotic heart disease of native coronary artery without angina pectoris: Secondary | ICD-10-CM | POA: Diagnosis not present

## 2023-06-24 DIAGNOSIS — G936 Cerebral edema: Secondary | ICD-10-CM | POA: Diagnosis not present

## 2023-06-24 DIAGNOSIS — I69151 Hemiplegia and hemiparesis following nontraumatic intracerebral hemorrhage affecting right dominant side: Secondary | ICD-10-CM | POA: Diagnosis not present

## 2023-06-24 DIAGNOSIS — E785 Hyperlipidemia, unspecified: Secondary | ICD-10-CM | POA: Diagnosis not present

## 2023-06-24 DIAGNOSIS — E119 Type 2 diabetes mellitus without complications: Secondary | ICD-10-CM | POA: Diagnosis not present

## 2023-06-24 DIAGNOSIS — Z955 Presence of coronary angioplasty implant and graft: Secondary | ICD-10-CM | POA: Diagnosis not present

## 2023-06-24 DIAGNOSIS — D164 Benign neoplasm of bones of skull and face: Secondary | ICD-10-CM | POA: Diagnosis not present

## 2023-06-24 DIAGNOSIS — Z7985 Long-term (current) use of injectable non-insulin antidiabetic drugs: Secondary | ICD-10-CM | POA: Diagnosis not present

## 2023-06-24 DIAGNOSIS — I4891 Unspecified atrial fibrillation: Secondary | ICD-10-CM | POA: Diagnosis not present

## 2023-06-24 DIAGNOSIS — Z87891 Personal history of nicotine dependence: Secondary | ICD-10-CM | POA: Diagnosis not present

## 2023-06-25 DIAGNOSIS — I252 Old myocardial infarction: Secondary | ICD-10-CM | POA: Diagnosis not present

## 2023-06-25 DIAGNOSIS — D164 Benign neoplasm of bones of skull and face: Secondary | ICD-10-CM | POA: Diagnosis not present

## 2023-06-25 DIAGNOSIS — E119 Type 2 diabetes mellitus without complications: Secondary | ICD-10-CM | POA: Diagnosis not present

## 2023-06-25 DIAGNOSIS — Z7985 Long-term (current) use of injectable non-insulin antidiabetic drugs: Secondary | ICD-10-CM | POA: Diagnosis not present

## 2023-06-25 DIAGNOSIS — I69193 Ataxia following nontraumatic intracerebral hemorrhage: Secondary | ICD-10-CM | POA: Diagnosis not present

## 2023-06-25 DIAGNOSIS — Z87891 Personal history of nicotine dependence: Secondary | ICD-10-CM | POA: Diagnosis not present

## 2023-06-25 DIAGNOSIS — I10 Essential (primary) hypertension: Secondary | ICD-10-CM | POA: Diagnosis not present

## 2023-06-25 DIAGNOSIS — Z7984 Long term (current) use of oral hypoglycemic drugs: Secondary | ICD-10-CM | POA: Diagnosis not present

## 2023-06-25 DIAGNOSIS — Z955 Presence of coronary angioplasty implant and graft: Secondary | ICD-10-CM | POA: Diagnosis not present

## 2023-06-25 DIAGNOSIS — I2510 Atherosclerotic heart disease of native coronary artery without angina pectoris: Secondary | ICD-10-CM | POA: Diagnosis not present

## 2023-06-25 DIAGNOSIS — E785 Hyperlipidemia, unspecified: Secondary | ICD-10-CM | POA: Diagnosis not present

## 2023-06-25 DIAGNOSIS — I69151 Hemiplegia and hemiparesis following nontraumatic intracerebral hemorrhage affecting right dominant side: Secondary | ICD-10-CM | POA: Diagnosis not present

## 2023-06-25 DIAGNOSIS — I4891 Unspecified atrial fibrillation: Secondary | ICD-10-CM | POA: Diagnosis not present

## 2023-06-25 DIAGNOSIS — E876 Hypokalemia: Secondary | ICD-10-CM | POA: Diagnosis not present

## 2023-06-25 DIAGNOSIS — G936 Cerebral edema: Secondary | ICD-10-CM | POA: Diagnosis not present

## 2023-06-28 DIAGNOSIS — I619 Nontraumatic intracerebral hemorrhage, unspecified: Secondary | ICD-10-CM | POA: Diagnosis not present

## 2023-06-28 DIAGNOSIS — I610 Nontraumatic intracerebral hemorrhage in hemisphere, subcortical: Secondary | ICD-10-CM | POA: Diagnosis not present

## 2023-06-28 DIAGNOSIS — I618 Other nontraumatic intracerebral hemorrhage: Secondary | ICD-10-CM | POA: Diagnosis not present

## 2023-07-01 DIAGNOSIS — I69151 Hemiplegia and hemiparesis following nontraumatic intracerebral hemorrhage affecting right dominant side: Secondary | ICD-10-CM | POA: Diagnosis not present

## 2023-07-01 DIAGNOSIS — E119 Type 2 diabetes mellitus without complications: Secondary | ICD-10-CM | POA: Diagnosis not present

## 2023-07-01 DIAGNOSIS — D164 Benign neoplasm of bones of skull and face: Secondary | ICD-10-CM | POA: Diagnosis not present

## 2023-07-01 DIAGNOSIS — I69193 Ataxia following nontraumatic intracerebral hemorrhage: Secondary | ICD-10-CM | POA: Diagnosis not present

## 2023-07-01 DIAGNOSIS — E785 Hyperlipidemia, unspecified: Secondary | ICD-10-CM | POA: Diagnosis not present

## 2023-07-01 DIAGNOSIS — I2510 Atherosclerotic heart disease of native coronary artery without angina pectoris: Secondary | ICD-10-CM | POA: Diagnosis not present

## 2023-07-01 DIAGNOSIS — I10 Essential (primary) hypertension: Secondary | ICD-10-CM | POA: Diagnosis not present

## 2023-07-01 DIAGNOSIS — G936 Cerebral edema: Secondary | ICD-10-CM | POA: Diagnosis not present

## 2023-07-01 DIAGNOSIS — Z955 Presence of coronary angioplasty implant and graft: Secondary | ICD-10-CM | POA: Diagnosis not present

## 2023-07-01 DIAGNOSIS — Z7984 Long term (current) use of oral hypoglycemic drugs: Secondary | ICD-10-CM | POA: Diagnosis not present

## 2023-07-01 DIAGNOSIS — I4891 Unspecified atrial fibrillation: Secondary | ICD-10-CM | POA: Diagnosis not present

## 2023-07-01 DIAGNOSIS — Z7985 Long-term (current) use of injectable non-insulin antidiabetic drugs: Secondary | ICD-10-CM | POA: Diagnosis not present

## 2023-07-01 DIAGNOSIS — E876 Hypokalemia: Secondary | ICD-10-CM | POA: Diagnosis not present

## 2023-07-01 DIAGNOSIS — I252 Old myocardial infarction: Secondary | ICD-10-CM | POA: Diagnosis not present

## 2023-07-01 DIAGNOSIS — Z87891 Personal history of nicotine dependence: Secondary | ICD-10-CM | POA: Diagnosis not present

## 2023-07-02 DIAGNOSIS — G936 Cerebral edema: Secondary | ICD-10-CM | POA: Diagnosis not present

## 2023-07-02 DIAGNOSIS — E119 Type 2 diabetes mellitus without complications: Secondary | ICD-10-CM | POA: Diagnosis not present

## 2023-07-02 DIAGNOSIS — E876 Hypokalemia: Secondary | ICD-10-CM | POA: Diagnosis not present

## 2023-07-02 DIAGNOSIS — Z7985 Long-term (current) use of injectable non-insulin antidiabetic drugs: Secondary | ICD-10-CM | POA: Diagnosis not present

## 2023-07-02 DIAGNOSIS — I10 Essential (primary) hypertension: Secondary | ICD-10-CM | POA: Diagnosis not present

## 2023-07-02 DIAGNOSIS — Z955 Presence of coronary angioplasty implant and graft: Secondary | ICD-10-CM | POA: Diagnosis not present

## 2023-07-02 DIAGNOSIS — E785 Hyperlipidemia, unspecified: Secondary | ICD-10-CM | POA: Diagnosis not present

## 2023-07-02 DIAGNOSIS — Z7984 Long term (current) use of oral hypoglycemic drugs: Secondary | ICD-10-CM | POA: Diagnosis not present

## 2023-07-02 DIAGNOSIS — D164 Benign neoplasm of bones of skull and face: Secondary | ICD-10-CM | POA: Diagnosis not present

## 2023-07-02 DIAGNOSIS — I619 Nontraumatic intracerebral hemorrhage, unspecified: Secondary | ICD-10-CM | POA: Diagnosis not present

## 2023-07-02 DIAGNOSIS — I4891 Unspecified atrial fibrillation: Secondary | ICD-10-CM | POA: Diagnosis not present

## 2023-07-02 DIAGNOSIS — E663 Overweight: Secondary | ICD-10-CM | POA: Diagnosis not present

## 2023-07-02 DIAGNOSIS — I2510 Atherosclerotic heart disease of native coronary artery without angina pectoris: Secondary | ICD-10-CM | POA: Diagnosis not present

## 2023-07-02 DIAGNOSIS — I69193 Ataxia following nontraumatic intracerebral hemorrhage: Secondary | ICD-10-CM | POA: Diagnosis not present

## 2023-07-02 DIAGNOSIS — I252 Old myocardial infarction: Secondary | ICD-10-CM | POA: Diagnosis not present

## 2023-07-02 DIAGNOSIS — I69151 Hemiplegia and hemiparesis following nontraumatic intracerebral hemorrhage affecting right dominant side: Secondary | ICD-10-CM | POA: Diagnosis not present

## 2023-07-02 DIAGNOSIS — Z87891 Personal history of nicotine dependence: Secondary | ICD-10-CM | POA: Diagnosis not present

## 2023-07-04 DIAGNOSIS — I10 Essential (primary) hypertension: Secondary | ICD-10-CM | POA: Diagnosis not present

## 2023-07-04 DIAGNOSIS — I2510 Atherosclerotic heart disease of native coronary artery without angina pectoris: Secondary | ICD-10-CM | POA: Diagnosis not present

## 2023-07-04 DIAGNOSIS — I480 Paroxysmal atrial fibrillation: Secondary | ICD-10-CM | POA: Diagnosis not present

## 2023-07-05 DIAGNOSIS — I69193 Ataxia following nontraumatic intracerebral hemorrhage: Secondary | ICD-10-CM | POA: Diagnosis not present

## 2023-07-05 DIAGNOSIS — Z955 Presence of coronary angioplasty implant and graft: Secondary | ICD-10-CM | POA: Diagnosis not present

## 2023-07-05 DIAGNOSIS — G936 Cerebral edema: Secondary | ICD-10-CM | POA: Diagnosis not present

## 2023-07-05 DIAGNOSIS — E876 Hypokalemia: Secondary | ICD-10-CM | POA: Diagnosis not present

## 2023-07-05 DIAGNOSIS — I10 Essential (primary) hypertension: Secondary | ICD-10-CM | POA: Diagnosis not present

## 2023-07-05 DIAGNOSIS — Z87891 Personal history of nicotine dependence: Secondary | ICD-10-CM | POA: Diagnosis not present

## 2023-07-05 DIAGNOSIS — I69151 Hemiplegia and hemiparesis following nontraumatic intracerebral hemorrhage affecting right dominant side: Secondary | ICD-10-CM | POA: Diagnosis not present

## 2023-07-05 DIAGNOSIS — Z7984 Long term (current) use of oral hypoglycemic drugs: Secondary | ICD-10-CM | POA: Diagnosis not present

## 2023-07-05 DIAGNOSIS — I4891 Unspecified atrial fibrillation: Secondary | ICD-10-CM | POA: Diagnosis not present

## 2023-07-05 DIAGNOSIS — E119 Type 2 diabetes mellitus without complications: Secondary | ICD-10-CM | POA: Diagnosis not present

## 2023-07-05 DIAGNOSIS — D164 Benign neoplasm of bones of skull and face: Secondary | ICD-10-CM | POA: Diagnosis not present

## 2023-07-05 DIAGNOSIS — I2510 Atherosclerotic heart disease of native coronary artery without angina pectoris: Secondary | ICD-10-CM | POA: Diagnosis not present

## 2023-07-05 DIAGNOSIS — I252 Old myocardial infarction: Secondary | ICD-10-CM | POA: Diagnosis not present

## 2023-07-05 DIAGNOSIS — E785 Hyperlipidemia, unspecified: Secondary | ICD-10-CM | POA: Diagnosis not present

## 2023-07-05 DIAGNOSIS — Z7985 Long-term (current) use of injectable non-insulin antidiabetic drugs: Secondary | ICD-10-CM | POA: Diagnosis not present

## 2023-07-08 DIAGNOSIS — D164 Benign neoplasm of bones of skull and face: Secondary | ICD-10-CM | POA: Diagnosis not present

## 2023-07-08 DIAGNOSIS — Z7985 Long-term (current) use of injectable non-insulin antidiabetic drugs: Secondary | ICD-10-CM | POA: Diagnosis not present

## 2023-07-08 DIAGNOSIS — I252 Old myocardial infarction: Secondary | ICD-10-CM | POA: Diagnosis not present

## 2023-07-08 DIAGNOSIS — I4891 Unspecified atrial fibrillation: Secondary | ICD-10-CM | POA: Diagnosis not present

## 2023-07-08 DIAGNOSIS — Z87891 Personal history of nicotine dependence: Secondary | ICD-10-CM | POA: Diagnosis not present

## 2023-07-08 DIAGNOSIS — I10 Essential (primary) hypertension: Secondary | ICD-10-CM | POA: Diagnosis not present

## 2023-07-08 DIAGNOSIS — I69193 Ataxia following nontraumatic intracerebral hemorrhage: Secondary | ICD-10-CM | POA: Diagnosis not present

## 2023-07-08 DIAGNOSIS — I69151 Hemiplegia and hemiparesis following nontraumatic intracerebral hemorrhage affecting right dominant side: Secondary | ICD-10-CM | POA: Diagnosis not present

## 2023-07-08 DIAGNOSIS — E785 Hyperlipidemia, unspecified: Secondary | ICD-10-CM | POA: Diagnosis not present

## 2023-07-08 DIAGNOSIS — Z955 Presence of coronary angioplasty implant and graft: Secondary | ICD-10-CM | POA: Diagnosis not present

## 2023-07-08 DIAGNOSIS — I2510 Atherosclerotic heart disease of native coronary artery without angina pectoris: Secondary | ICD-10-CM | POA: Diagnosis not present

## 2023-07-08 DIAGNOSIS — G936 Cerebral edema: Secondary | ICD-10-CM | POA: Diagnosis not present

## 2023-07-08 DIAGNOSIS — E119 Type 2 diabetes mellitus without complications: Secondary | ICD-10-CM | POA: Diagnosis not present

## 2023-07-08 DIAGNOSIS — Z7984 Long term (current) use of oral hypoglycemic drugs: Secondary | ICD-10-CM | POA: Diagnosis not present

## 2023-07-08 DIAGNOSIS — E876 Hypokalemia: Secondary | ICD-10-CM | POA: Diagnosis not present

## 2023-07-09 DIAGNOSIS — I10 Essential (primary) hypertension: Secondary | ICD-10-CM | POA: Diagnosis not present

## 2023-07-10 DIAGNOSIS — I10 Essential (primary) hypertension: Secondary | ICD-10-CM | POA: Diagnosis not present

## 2023-07-11 DIAGNOSIS — Z7985 Long-term (current) use of injectable non-insulin antidiabetic drugs: Secondary | ICD-10-CM | POA: Diagnosis not present

## 2023-07-11 DIAGNOSIS — I2510 Atherosclerotic heart disease of native coronary artery without angina pectoris: Secondary | ICD-10-CM | POA: Diagnosis not present

## 2023-07-11 DIAGNOSIS — Z955 Presence of coronary angioplasty implant and graft: Secondary | ICD-10-CM | POA: Diagnosis not present

## 2023-07-11 DIAGNOSIS — E119 Type 2 diabetes mellitus without complications: Secondary | ICD-10-CM | POA: Diagnosis not present

## 2023-07-11 DIAGNOSIS — Z87891 Personal history of nicotine dependence: Secondary | ICD-10-CM | POA: Diagnosis not present

## 2023-07-11 DIAGNOSIS — D164 Benign neoplasm of bones of skull and face: Secondary | ICD-10-CM | POA: Diagnosis not present

## 2023-07-11 DIAGNOSIS — I10 Essential (primary) hypertension: Secondary | ICD-10-CM | POA: Diagnosis not present

## 2023-07-11 DIAGNOSIS — E876 Hypokalemia: Secondary | ICD-10-CM | POA: Diagnosis not present

## 2023-07-11 DIAGNOSIS — I69151 Hemiplegia and hemiparesis following nontraumatic intracerebral hemorrhage affecting right dominant side: Secondary | ICD-10-CM | POA: Diagnosis not present

## 2023-07-11 DIAGNOSIS — G936 Cerebral edema: Secondary | ICD-10-CM | POA: Diagnosis not present

## 2023-07-11 DIAGNOSIS — I252 Old myocardial infarction: Secondary | ICD-10-CM | POA: Diagnosis not present

## 2023-07-11 DIAGNOSIS — I4891 Unspecified atrial fibrillation: Secondary | ICD-10-CM | POA: Diagnosis not present

## 2023-07-11 DIAGNOSIS — I69193 Ataxia following nontraumatic intracerebral hemorrhage: Secondary | ICD-10-CM | POA: Diagnosis not present

## 2023-07-11 DIAGNOSIS — E785 Hyperlipidemia, unspecified: Secondary | ICD-10-CM | POA: Diagnosis not present

## 2023-07-11 DIAGNOSIS — Z7984 Long term (current) use of oral hypoglycemic drugs: Secondary | ICD-10-CM | POA: Diagnosis not present

## 2023-07-15 DIAGNOSIS — I4891 Unspecified atrial fibrillation: Secondary | ICD-10-CM | POA: Diagnosis not present

## 2023-07-15 DIAGNOSIS — D164 Benign neoplasm of bones of skull and face: Secondary | ICD-10-CM | POA: Diagnosis not present

## 2023-07-15 DIAGNOSIS — Z7984 Long term (current) use of oral hypoglycemic drugs: Secondary | ICD-10-CM | POA: Diagnosis not present

## 2023-07-15 DIAGNOSIS — E785 Hyperlipidemia, unspecified: Secondary | ICD-10-CM | POA: Diagnosis not present

## 2023-07-15 DIAGNOSIS — I2510 Atherosclerotic heart disease of native coronary artery without angina pectoris: Secondary | ICD-10-CM | POA: Diagnosis not present

## 2023-07-15 DIAGNOSIS — E119 Type 2 diabetes mellitus without complications: Secondary | ICD-10-CM | POA: Diagnosis not present

## 2023-07-15 DIAGNOSIS — E876 Hypokalemia: Secondary | ICD-10-CM | POA: Diagnosis not present

## 2023-07-15 DIAGNOSIS — I10 Essential (primary) hypertension: Secondary | ICD-10-CM | POA: Diagnosis not present

## 2023-07-15 DIAGNOSIS — I69151 Hemiplegia and hemiparesis following nontraumatic intracerebral hemorrhage affecting right dominant side: Secondary | ICD-10-CM | POA: Diagnosis not present

## 2023-07-15 DIAGNOSIS — Z87891 Personal history of nicotine dependence: Secondary | ICD-10-CM | POA: Diagnosis not present

## 2023-07-15 DIAGNOSIS — Z7985 Long-term (current) use of injectable non-insulin antidiabetic drugs: Secondary | ICD-10-CM | POA: Diagnosis not present

## 2023-07-15 DIAGNOSIS — Z955 Presence of coronary angioplasty implant and graft: Secondary | ICD-10-CM | POA: Diagnosis not present

## 2023-07-15 DIAGNOSIS — I252 Old myocardial infarction: Secondary | ICD-10-CM | POA: Diagnosis not present

## 2023-07-15 DIAGNOSIS — I69193 Ataxia following nontraumatic intracerebral hemorrhage: Secondary | ICD-10-CM | POA: Diagnosis not present

## 2023-07-15 DIAGNOSIS — G936 Cerebral edema: Secondary | ICD-10-CM | POA: Diagnosis not present

## 2023-07-17 DIAGNOSIS — M899 Disorder of bone, unspecified: Secondary | ICD-10-CM | POA: Diagnosis not present

## 2023-07-17 DIAGNOSIS — M898X1 Other specified disorders of bone, shoulder: Secondary | ICD-10-CM | POA: Diagnosis not present

## 2023-07-18 DIAGNOSIS — E876 Hypokalemia: Secondary | ICD-10-CM | POA: Diagnosis not present

## 2023-07-18 DIAGNOSIS — I4891 Unspecified atrial fibrillation: Secondary | ICD-10-CM | POA: Diagnosis not present

## 2023-07-18 DIAGNOSIS — G936 Cerebral edema: Secondary | ICD-10-CM | POA: Diagnosis not present

## 2023-07-18 DIAGNOSIS — Z7985 Long-term (current) use of injectable non-insulin antidiabetic drugs: Secondary | ICD-10-CM | POA: Diagnosis not present

## 2023-07-18 DIAGNOSIS — I252 Old myocardial infarction: Secondary | ICD-10-CM | POA: Diagnosis not present

## 2023-07-18 DIAGNOSIS — I69193 Ataxia following nontraumatic intracerebral hemorrhage: Secondary | ICD-10-CM | POA: Diagnosis not present

## 2023-07-18 DIAGNOSIS — I2510 Atherosclerotic heart disease of native coronary artery without angina pectoris: Secondary | ICD-10-CM | POA: Diagnosis not present

## 2023-07-18 DIAGNOSIS — I10 Essential (primary) hypertension: Secondary | ICD-10-CM | POA: Diagnosis not present

## 2023-07-18 DIAGNOSIS — I69151 Hemiplegia and hemiparesis following nontraumatic intracerebral hemorrhage affecting right dominant side: Secondary | ICD-10-CM | POA: Diagnosis not present

## 2023-07-18 DIAGNOSIS — Z7984 Long term (current) use of oral hypoglycemic drugs: Secondary | ICD-10-CM | POA: Diagnosis not present

## 2023-07-18 DIAGNOSIS — Z87891 Personal history of nicotine dependence: Secondary | ICD-10-CM | POA: Diagnosis not present

## 2023-07-18 DIAGNOSIS — Z955 Presence of coronary angioplasty implant and graft: Secondary | ICD-10-CM | POA: Diagnosis not present

## 2023-07-18 DIAGNOSIS — E785 Hyperlipidemia, unspecified: Secondary | ICD-10-CM | POA: Diagnosis not present

## 2023-07-18 DIAGNOSIS — D164 Benign neoplasm of bones of skull and face: Secondary | ICD-10-CM | POA: Diagnosis not present

## 2023-07-18 DIAGNOSIS — E119 Type 2 diabetes mellitus without complications: Secondary | ICD-10-CM | POA: Diagnosis not present

## 2023-07-19 DIAGNOSIS — Z7984 Long term (current) use of oral hypoglycemic drugs: Secondary | ICD-10-CM | POA: Diagnosis not present

## 2023-07-19 DIAGNOSIS — I69193 Ataxia following nontraumatic intracerebral hemorrhage: Secondary | ICD-10-CM | POA: Diagnosis not present

## 2023-07-19 DIAGNOSIS — Z955 Presence of coronary angioplasty implant and graft: Secondary | ICD-10-CM | POA: Diagnosis not present

## 2023-07-19 DIAGNOSIS — Z7985 Long-term (current) use of injectable non-insulin antidiabetic drugs: Secondary | ICD-10-CM | POA: Diagnosis not present

## 2023-07-19 DIAGNOSIS — I10 Essential (primary) hypertension: Secondary | ICD-10-CM | POA: Diagnosis not present

## 2023-07-19 DIAGNOSIS — G936 Cerebral edema: Secondary | ICD-10-CM | POA: Diagnosis not present

## 2023-07-19 DIAGNOSIS — Z87891 Personal history of nicotine dependence: Secondary | ICD-10-CM | POA: Diagnosis not present

## 2023-07-19 DIAGNOSIS — E119 Type 2 diabetes mellitus without complications: Secondary | ICD-10-CM | POA: Diagnosis not present

## 2023-07-19 DIAGNOSIS — E785 Hyperlipidemia, unspecified: Secondary | ICD-10-CM | POA: Diagnosis not present

## 2023-07-19 DIAGNOSIS — E876 Hypokalemia: Secondary | ICD-10-CM | POA: Diagnosis not present

## 2023-07-19 DIAGNOSIS — I252 Old myocardial infarction: Secondary | ICD-10-CM | POA: Diagnosis not present

## 2023-07-19 DIAGNOSIS — I69151 Hemiplegia and hemiparesis following nontraumatic intracerebral hemorrhage affecting right dominant side: Secondary | ICD-10-CM | POA: Diagnosis not present

## 2023-07-19 DIAGNOSIS — D164 Benign neoplasm of bones of skull and face: Secondary | ICD-10-CM | POA: Diagnosis not present

## 2023-07-19 DIAGNOSIS — I4891 Unspecified atrial fibrillation: Secondary | ICD-10-CM | POA: Diagnosis not present

## 2023-07-19 DIAGNOSIS — I2510 Atherosclerotic heart disease of native coronary artery without angina pectoris: Secondary | ICD-10-CM | POA: Diagnosis not present

## 2023-07-23 DIAGNOSIS — Z87891 Personal history of nicotine dependence: Secondary | ICD-10-CM | POA: Diagnosis not present

## 2023-07-23 DIAGNOSIS — D164 Benign neoplasm of bones of skull and face: Secondary | ICD-10-CM | POA: Diagnosis not present

## 2023-07-23 DIAGNOSIS — E785 Hyperlipidemia, unspecified: Secondary | ICD-10-CM | POA: Diagnosis not present

## 2023-07-23 DIAGNOSIS — Z955 Presence of coronary angioplasty implant and graft: Secondary | ICD-10-CM | POA: Diagnosis not present

## 2023-07-23 DIAGNOSIS — I252 Old myocardial infarction: Secondary | ICD-10-CM | POA: Diagnosis not present

## 2023-07-23 DIAGNOSIS — E119 Type 2 diabetes mellitus without complications: Secondary | ICD-10-CM | POA: Diagnosis not present

## 2023-07-23 DIAGNOSIS — I4891 Unspecified atrial fibrillation: Secondary | ICD-10-CM | POA: Diagnosis not present

## 2023-07-23 DIAGNOSIS — I69193 Ataxia following nontraumatic intracerebral hemorrhage: Secondary | ICD-10-CM | POA: Diagnosis not present

## 2023-07-23 DIAGNOSIS — I2510 Atherosclerotic heart disease of native coronary artery without angina pectoris: Secondary | ICD-10-CM | POA: Diagnosis not present

## 2023-07-23 DIAGNOSIS — I69151 Hemiplegia and hemiparesis following nontraumatic intracerebral hemorrhage affecting right dominant side: Secondary | ICD-10-CM | POA: Diagnosis not present

## 2023-07-23 DIAGNOSIS — E876 Hypokalemia: Secondary | ICD-10-CM | POA: Diagnosis not present

## 2023-07-23 DIAGNOSIS — G936 Cerebral edema: Secondary | ICD-10-CM | POA: Diagnosis not present

## 2023-07-23 DIAGNOSIS — Z7984 Long term (current) use of oral hypoglycemic drugs: Secondary | ICD-10-CM | POA: Diagnosis not present

## 2023-07-23 DIAGNOSIS — Z7985 Long-term (current) use of injectable non-insulin antidiabetic drugs: Secondary | ICD-10-CM | POA: Diagnosis not present

## 2023-07-23 DIAGNOSIS — I10 Essential (primary) hypertension: Secondary | ICD-10-CM | POA: Diagnosis not present

## 2023-07-24 DIAGNOSIS — Z79899 Other long term (current) drug therapy: Secondary | ICD-10-CM | POA: Diagnosis not present

## 2023-07-24 DIAGNOSIS — D649 Anemia, unspecified: Secondary | ICD-10-CM | POA: Diagnosis not present

## 2023-07-24 DIAGNOSIS — R9402 Abnormal brain scan: Secondary | ICD-10-CM | POA: Diagnosis not present

## 2023-07-24 DIAGNOSIS — M899 Disorder of bone, unspecified: Secondary | ICD-10-CM | POA: Diagnosis not present

## 2023-07-24 DIAGNOSIS — R9389 Abnormal findings on diagnostic imaging of other specified body structures: Secondary | ICD-10-CM | POA: Diagnosis not present

## 2023-07-31 DIAGNOSIS — E876 Hypokalemia: Secondary | ICD-10-CM | POA: Diagnosis not present

## 2023-07-31 DIAGNOSIS — E785 Hyperlipidemia, unspecified: Secondary | ICD-10-CM | POA: Diagnosis not present

## 2023-07-31 DIAGNOSIS — I69151 Hemiplegia and hemiparesis following nontraumatic intracerebral hemorrhage affecting right dominant side: Secondary | ICD-10-CM | POA: Diagnosis not present

## 2023-07-31 DIAGNOSIS — I2510 Atherosclerotic heart disease of native coronary artery without angina pectoris: Secondary | ICD-10-CM | POA: Diagnosis not present

## 2023-07-31 DIAGNOSIS — I252 Old myocardial infarction: Secondary | ICD-10-CM | POA: Diagnosis not present

## 2023-07-31 DIAGNOSIS — G936 Cerebral edema: Secondary | ICD-10-CM | POA: Diagnosis not present

## 2023-07-31 DIAGNOSIS — Z955 Presence of coronary angioplasty implant and graft: Secondary | ICD-10-CM | POA: Diagnosis not present

## 2023-07-31 DIAGNOSIS — I10 Essential (primary) hypertension: Secondary | ICD-10-CM | POA: Diagnosis not present

## 2023-07-31 DIAGNOSIS — D164 Benign neoplasm of bones of skull and face: Secondary | ICD-10-CM | POA: Diagnosis not present

## 2023-07-31 DIAGNOSIS — E119 Type 2 diabetes mellitus without complications: Secondary | ICD-10-CM | POA: Diagnosis not present

## 2023-07-31 DIAGNOSIS — I69193 Ataxia following nontraumatic intracerebral hemorrhage: Secondary | ICD-10-CM | POA: Diagnosis not present

## 2023-07-31 DIAGNOSIS — Z7984 Long term (current) use of oral hypoglycemic drugs: Secondary | ICD-10-CM | POA: Diagnosis not present

## 2023-07-31 DIAGNOSIS — Z87891 Personal history of nicotine dependence: Secondary | ICD-10-CM | POA: Diagnosis not present

## 2023-07-31 DIAGNOSIS — Z7985 Long-term (current) use of injectable non-insulin antidiabetic drugs: Secondary | ICD-10-CM | POA: Diagnosis not present

## 2023-07-31 DIAGNOSIS — I4891 Unspecified atrial fibrillation: Secondary | ICD-10-CM | POA: Diagnosis not present

## 2023-08-01 DIAGNOSIS — R9402 Abnormal brain scan: Secondary | ICD-10-CM | POA: Diagnosis not present

## 2023-08-02 DIAGNOSIS — I69151 Hemiplegia and hemiparesis following nontraumatic intracerebral hemorrhage affecting right dominant side: Secondary | ICD-10-CM | POA: Diagnosis not present

## 2023-08-02 DIAGNOSIS — I10 Essential (primary) hypertension: Secondary | ICD-10-CM | POA: Diagnosis not present

## 2023-08-02 DIAGNOSIS — I252 Old myocardial infarction: Secondary | ICD-10-CM | POA: Diagnosis not present

## 2023-08-02 DIAGNOSIS — E119 Type 2 diabetes mellitus without complications: Secondary | ICD-10-CM | POA: Diagnosis not present

## 2023-08-02 DIAGNOSIS — G936 Cerebral edema: Secondary | ICD-10-CM | POA: Diagnosis not present

## 2023-08-02 DIAGNOSIS — I2510 Atherosclerotic heart disease of native coronary artery without angina pectoris: Secondary | ICD-10-CM | POA: Diagnosis not present

## 2023-08-02 DIAGNOSIS — Z955 Presence of coronary angioplasty implant and graft: Secondary | ICD-10-CM | POA: Diagnosis not present

## 2023-08-02 DIAGNOSIS — I4891 Unspecified atrial fibrillation: Secondary | ICD-10-CM | POA: Diagnosis not present

## 2023-08-02 DIAGNOSIS — E876 Hypokalemia: Secondary | ICD-10-CM | POA: Diagnosis not present

## 2023-08-02 DIAGNOSIS — D164 Benign neoplasm of bones of skull and face: Secondary | ICD-10-CM | POA: Diagnosis not present

## 2023-08-02 DIAGNOSIS — Z87891 Personal history of nicotine dependence: Secondary | ICD-10-CM | POA: Diagnosis not present

## 2023-08-02 DIAGNOSIS — Z7984 Long term (current) use of oral hypoglycemic drugs: Secondary | ICD-10-CM | POA: Diagnosis not present

## 2023-08-02 DIAGNOSIS — I69193 Ataxia following nontraumatic intracerebral hemorrhage: Secondary | ICD-10-CM | POA: Diagnosis not present

## 2023-08-02 DIAGNOSIS — E785 Hyperlipidemia, unspecified: Secondary | ICD-10-CM | POA: Diagnosis not present

## 2023-08-02 DIAGNOSIS — Z7985 Long-term (current) use of injectable non-insulin antidiabetic drugs: Secondary | ICD-10-CM | POA: Diagnosis not present

## 2023-08-06 DIAGNOSIS — I10 Essential (primary) hypertension: Secondary | ICD-10-CM | POA: Diagnosis not present

## 2023-08-06 DIAGNOSIS — Z7984 Long term (current) use of oral hypoglycemic drugs: Secondary | ICD-10-CM | POA: Diagnosis not present

## 2023-08-06 DIAGNOSIS — I252 Old myocardial infarction: Secondary | ICD-10-CM | POA: Diagnosis not present

## 2023-08-06 DIAGNOSIS — Z7985 Long-term (current) use of injectable non-insulin antidiabetic drugs: Secondary | ICD-10-CM | POA: Diagnosis not present

## 2023-08-06 DIAGNOSIS — Z955 Presence of coronary angioplasty implant and graft: Secondary | ICD-10-CM | POA: Diagnosis not present

## 2023-08-06 DIAGNOSIS — E785 Hyperlipidemia, unspecified: Secondary | ICD-10-CM | POA: Diagnosis not present

## 2023-08-06 DIAGNOSIS — G936 Cerebral edema: Secondary | ICD-10-CM | POA: Diagnosis not present

## 2023-08-06 DIAGNOSIS — I2510 Atherosclerotic heart disease of native coronary artery without angina pectoris: Secondary | ICD-10-CM | POA: Diagnosis not present

## 2023-08-06 DIAGNOSIS — I69151 Hemiplegia and hemiparesis following nontraumatic intracerebral hemorrhage affecting right dominant side: Secondary | ICD-10-CM | POA: Diagnosis not present

## 2023-08-06 DIAGNOSIS — D164 Benign neoplasm of bones of skull and face: Secondary | ICD-10-CM | POA: Diagnosis not present

## 2023-08-06 DIAGNOSIS — E119 Type 2 diabetes mellitus without complications: Secondary | ICD-10-CM | POA: Diagnosis not present

## 2023-08-06 DIAGNOSIS — I69193 Ataxia following nontraumatic intracerebral hemorrhage: Secondary | ICD-10-CM | POA: Diagnosis not present

## 2023-08-06 DIAGNOSIS — I4891 Unspecified atrial fibrillation: Secondary | ICD-10-CM | POA: Diagnosis not present

## 2023-08-06 DIAGNOSIS — E876 Hypokalemia: Secondary | ICD-10-CM | POA: Diagnosis not present

## 2023-08-06 DIAGNOSIS — Z87891 Personal history of nicotine dependence: Secondary | ICD-10-CM | POA: Diagnosis not present

## 2023-08-07 DIAGNOSIS — M79604 Pain in right leg: Secondary | ICD-10-CM | POA: Diagnosis not present

## 2023-08-07 DIAGNOSIS — I10 Essential (primary) hypertension: Secondary | ICD-10-CM | POA: Diagnosis not present

## 2023-08-07 DIAGNOSIS — I480 Paroxysmal atrial fibrillation: Secondary | ICD-10-CM | POA: Diagnosis not present

## 2023-08-07 DIAGNOSIS — I619 Nontraumatic intracerebral hemorrhage, unspecified: Secondary | ICD-10-CM | POA: Diagnosis not present

## 2023-08-08 DIAGNOSIS — D649 Anemia, unspecified: Secondary | ICD-10-CM | POA: Diagnosis not present

## 2023-08-08 DIAGNOSIS — R9389 Abnormal findings on diagnostic imaging of other specified body structures: Secondary | ICD-10-CM | POA: Diagnosis not present

## 2023-08-08 DIAGNOSIS — M899 Disorder of bone, unspecified: Secondary | ICD-10-CM | POA: Diagnosis not present

## 2023-08-13 DIAGNOSIS — E119 Type 2 diabetes mellitus without complications: Secondary | ICD-10-CM | POA: Diagnosis not present

## 2023-08-13 DIAGNOSIS — I2510 Atherosclerotic heart disease of native coronary artery without angina pectoris: Secondary | ICD-10-CM | POA: Diagnosis not present

## 2023-08-13 DIAGNOSIS — Z87891 Personal history of nicotine dependence: Secondary | ICD-10-CM | POA: Diagnosis not present

## 2023-08-13 DIAGNOSIS — D164 Benign neoplasm of bones of skull and face: Secondary | ICD-10-CM | POA: Diagnosis not present

## 2023-08-13 DIAGNOSIS — I252 Old myocardial infarction: Secondary | ICD-10-CM | POA: Diagnosis not present

## 2023-08-13 DIAGNOSIS — E785 Hyperlipidemia, unspecified: Secondary | ICD-10-CM | POA: Diagnosis not present

## 2023-08-13 DIAGNOSIS — Z955 Presence of coronary angioplasty implant and graft: Secondary | ICD-10-CM | POA: Diagnosis not present

## 2023-08-13 DIAGNOSIS — I69151 Hemiplegia and hemiparesis following nontraumatic intracerebral hemorrhage affecting right dominant side: Secondary | ICD-10-CM | POA: Diagnosis not present

## 2023-08-13 DIAGNOSIS — I4891 Unspecified atrial fibrillation: Secondary | ICD-10-CM | POA: Diagnosis not present

## 2023-08-13 DIAGNOSIS — I69193 Ataxia following nontraumatic intracerebral hemorrhage: Secondary | ICD-10-CM | POA: Diagnosis not present

## 2023-08-13 DIAGNOSIS — E876 Hypokalemia: Secondary | ICD-10-CM | POA: Diagnosis not present

## 2023-08-13 DIAGNOSIS — Z7985 Long-term (current) use of injectable non-insulin antidiabetic drugs: Secondary | ICD-10-CM | POA: Diagnosis not present

## 2023-08-13 DIAGNOSIS — Z7984 Long term (current) use of oral hypoglycemic drugs: Secondary | ICD-10-CM | POA: Diagnosis not present

## 2023-08-13 DIAGNOSIS — G936 Cerebral edema: Secondary | ICD-10-CM | POA: Diagnosis not present

## 2023-08-13 DIAGNOSIS — I10 Essential (primary) hypertension: Secondary | ICD-10-CM | POA: Diagnosis not present

## 2023-08-14 DIAGNOSIS — E876 Hypokalemia: Secondary | ICD-10-CM | POA: Diagnosis not present

## 2023-08-14 DIAGNOSIS — I252 Old myocardial infarction: Secondary | ICD-10-CM | POA: Diagnosis not present

## 2023-08-14 DIAGNOSIS — Z7984 Long term (current) use of oral hypoglycemic drugs: Secondary | ICD-10-CM | POA: Diagnosis not present

## 2023-08-14 DIAGNOSIS — I69151 Hemiplegia and hemiparesis following nontraumatic intracerebral hemorrhage affecting right dominant side: Secondary | ICD-10-CM | POA: Diagnosis not present

## 2023-08-14 DIAGNOSIS — E119 Type 2 diabetes mellitus without complications: Secondary | ICD-10-CM | POA: Diagnosis not present

## 2023-08-14 DIAGNOSIS — Z7985 Long-term (current) use of injectable non-insulin antidiabetic drugs: Secondary | ICD-10-CM | POA: Diagnosis not present

## 2023-08-14 DIAGNOSIS — I2510 Atherosclerotic heart disease of native coronary artery without angina pectoris: Secondary | ICD-10-CM | POA: Diagnosis not present

## 2023-08-14 DIAGNOSIS — E785 Hyperlipidemia, unspecified: Secondary | ICD-10-CM | POA: Diagnosis not present

## 2023-08-14 DIAGNOSIS — I4891 Unspecified atrial fibrillation: Secondary | ICD-10-CM | POA: Diagnosis not present

## 2023-08-14 DIAGNOSIS — I69193 Ataxia following nontraumatic intracerebral hemorrhage: Secondary | ICD-10-CM | POA: Diagnosis not present

## 2023-08-14 DIAGNOSIS — Z87891 Personal history of nicotine dependence: Secondary | ICD-10-CM | POA: Diagnosis not present

## 2023-08-14 DIAGNOSIS — Z955 Presence of coronary angioplasty implant and graft: Secondary | ICD-10-CM | POA: Diagnosis not present

## 2023-08-14 DIAGNOSIS — D164 Benign neoplasm of bones of skull and face: Secondary | ICD-10-CM | POA: Diagnosis not present

## 2023-08-14 DIAGNOSIS — G936 Cerebral edema: Secondary | ICD-10-CM | POA: Diagnosis not present

## 2023-08-14 DIAGNOSIS — I10 Essential (primary) hypertension: Secondary | ICD-10-CM | POA: Diagnosis not present

## 2023-09-06 DIAGNOSIS — G4733 Obstructive sleep apnea (adult) (pediatric): Secondary | ICD-10-CM | POA: Diagnosis not present

## 2023-09-25 DIAGNOSIS — L814 Other melanin hyperpigmentation: Secondary | ICD-10-CM | POA: Diagnosis not present

## 2023-09-25 DIAGNOSIS — L578 Other skin changes due to chronic exposure to nonionizing radiation: Secondary | ICD-10-CM | POA: Diagnosis not present

## 2023-09-25 DIAGNOSIS — L821 Other seborrheic keratosis: Secondary | ICD-10-CM | POA: Diagnosis not present

## 2023-09-25 DIAGNOSIS — D2371 Other benign neoplasm of skin of right lower limb, including hip: Secondary | ICD-10-CM | POA: Diagnosis not present

## 2023-10-21 DIAGNOSIS — M4727 Other spondylosis with radiculopathy, lumbosacral region: Secondary | ICD-10-CM | POA: Diagnosis not present

## 2023-10-21 DIAGNOSIS — M5136 Other intervertebral disc degeneration, lumbar region with discogenic back pain only: Secondary | ICD-10-CM | POA: Diagnosis not present

## 2023-11-04 DIAGNOSIS — M6281 Muscle weakness (generalized): Secondary | ICD-10-CM | POA: Diagnosis not present

## 2023-11-04 DIAGNOSIS — M5416 Radiculopathy, lumbar region: Secondary | ICD-10-CM | POA: Diagnosis not present

## 2023-11-04 DIAGNOSIS — M79604 Pain in right leg: Secondary | ICD-10-CM | POA: Diagnosis not present

## 2023-12-06 DIAGNOSIS — I480 Paroxysmal atrial fibrillation: Secondary | ICD-10-CM | POA: Diagnosis not present

## 2023-12-06 DIAGNOSIS — I10 Essential (primary) hypertension: Secondary | ICD-10-CM | POA: Diagnosis not present

## 2024-01-08 DIAGNOSIS — E785 Hyperlipidemia, unspecified: Secondary | ICD-10-CM | POA: Diagnosis not present

## 2024-01-08 DIAGNOSIS — R4182 Altered mental status, unspecified: Secondary | ICD-10-CM | POA: Diagnosis not present

## 2024-01-08 DIAGNOSIS — E119 Type 2 diabetes mellitus without complications: Secondary | ICD-10-CM | POA: Diagnosis not present

## 2024-01-08 DIAGNOSIS — Z87891 Personal history of nicotine dependence: Secondary | ICD-10-CM | POA: Diagnosis not present

## 2024-01-08 DIAGNOSIS — I4891 Unspecified atrial fibrillation: Secondary | ICD-10-CM | POA: Diagnosis not present

## 2024-01-08 DIAGNOSIS — R29898 Other symptoms and signs involving the musculoskeletal system: Secondary | ICD-10-CM | POA: Diagnosis not present

## 2024-01-08 DIAGNOSIS — G459 Transient cerebral ischemic attack, unspecified: Secondary | ICD-10-CM | POA: Diagnosis not present

## 2024-01-08 DIAGNOSIS — R0789 Other chest pain: Secondary | ICD-10-CM | POA: Diagnosis not present

## 2024-01-08 DIAGNOSIS — Z7984 Long term (current) use of oral hypoglycemic drugs: Secondary | ICD-10-CM | POA: Diagnosis not present

## 2024-01-08 DIAGNOSIS — R531 Weakness: Secondary | ICD-10-CM | POA: Diagnosis not present

## 2024-01-08 DIAGNOSIS — M6281 Muscle weakness (generalized): Secondary | ICD-10-CM | POA: Diagnosis not present

## 2024-01-08 DIAGNOSIS — I2510 Atherosclerotic heart disease of native coronary artery without angina pectoris: Secondary | ICD-10-CM | POA: Diagnosis not present

## 2024-01-08 DIAGNOSIS — Z8673 Personal history of transient ischemic attack (TIA), and cerebral infarction without residual deficits: Secondary | ICD-10-CM | POA: Diagnosis not present

## 2024-01-08 DIAGNOSIS — Z955 Presence of coronary angioplasty implant and graft: Secondary | ICD-10-CM | POA: Diagnosis not present

## 2024-01-08 DIAGNOSIS — R4781 Slurred speech: Secondary | ICD-10-CM | POA: Diagnosis not present

## 2024-01-08 DIAGNOSIS — Z7901 Long term (current) use of anticoagulants: Secondary | ICD-10-CM | POA: Diagnosis not present

## 2024-01-08 DIAGNOSIS — I119 Hypertensive heart disease without heart failure: Secondary | ICD-10-CM | POA: Diagnosis not present

## 2024-01-09 DIAGNOSIS — R4182 Altered mental status, unspecified: Secondary | ICD-10-CM | POA: Diagnosis not present

## 2024-01-09 DIAGNOSIS — F17201 Nicotine dependence, unspecified, in remission: Secondary | ICD-10-CM | POA: Diagnosis not present

## 2024-01-09 DIAGNOSIS — E785 Hyperlipidemia, unspecified: Secondary | ICD-10-CM | POA: Diagnosis not present

## 2024-01-09 DIAGNOSIS — F39 Unspecified mood [affective] disorder: Secondary | ICD-10-CM | POA: Diagnosis not present

## 2024-01-09 DIAGNOSIS — I6523 Occlusion and stenosis of bilateral carotid arteries: Secondary | ICD-10-CM | POA: Diagnosis not present

## 2024-01-09 DIAGNOSIS — I119 Hypertensive heart disease without heart failure: Secondary | ICD-10-CM | POA: Diagnosis not present

## 2024-01-09 DIAGNOSIS — Z7984 Long term (current) use of oral hypoglycemic drugs: Secondary | ICD-10-CM | POA: Diagnosis not present

## 2024-01-09 DIAGNOSIS — R29700 NIHSS score 0: Secondary | ICD-10-CM | POA: Diagnosis not present

## 2024-01-09 DIAGNOSIS — R072 Precordial pain: Secondary | ICD-10-CM | POA: Diagnosis not present

## 2024-01-09 DIAGNOSIS — I4891 Unspecified atrial fibrillation: Secondary | ICD-10-CM | POA: Diagnosis not present

## 2024-01-09 DIAGNOSIS — G311 Senile degeneration of brain, not elsewhere classified: Secondary | ICD-10-CM | POA: Diagnosis not present

## 2024-01-09 DIAGNOSIS — R531 Weakness: Secondary | ICD-10-CM | POA: Diagnosis not present

## 2024-01-09 DIAGNOSIS — R29898 Other symptoms and signs involving the musculoskeletal system: Secondary | ICD-10-CM | POA: Diagnosis not present

## 2024-01-09 DIAGNOSIS — R900 Intracranial space-occupying lesion found on diagnostic imaging of central nervous system: Secondary | ICD-10-CM | POA: Diagnosis not present

## 2024-01-09 DIAGNOSIS — E119 Type 2 diabetes mellitus without complications: Secondary | ICD-10-CM | POA: Diagnosis not present

## 2024-01-09 DIAGNOSIS — R4789 Other speech disturbances: Secondary | ICD-10-CM | POA: Diagnosis not present

## 2024-01-09 DIAGNOSIS — M6281 Muscle weakness (generalized): Secondary | ICD-10-CM | POA: Diagnosis not present

## 2024-01-09 DIAGNOSIS — I10 Essential (primary) hypertension: Secondary | ICD-10-CM | POA: Diagnosis not present

## 2024-01-09 DIAGNOSIS — Z951 Presence of aortocoronary bypass graft: Secondary | ICD-10-CM | POA: Diagnosis not present

## 2024-01-09 DIAGNOSIS — I2510 Atherosclerotic heart disease of native coronary artery without angina pectoris: Secondary | ICD-10-CM | POA: Diagnosis not present

## 2024-01-09 DIAGNOSIS — M899 Disorder of bone, unspecified: Secondary | ICD-10-CM | POA: Diagnosis not present

## 2024-01-09 DIAGNOSIS — Z9889 Other specified postprocedural states: Secondary | ICD-10-CM | POA: Diagnosis not present

## 2024-01-09 DIAGNOSIS — I639 Cerebral infarction, unspecified: Secondary | ICD-10-CM | POA: Diagnosis not present

## 2024-01-09 DIAGNOSIS — R4781 Slurred speech: Secondary | ICD-10-CM | POA: Diagnosis not present

## 2024-01-09 DIAGNOSIS — G9389 Other specified disorders of brain: Secondary | ICD-10-CM | POA: Diagnosis not present

## 2024-01-09 DIAGNOSIS — Z955 Presence of coronary angioplasty implant and graft: Secondary | ICD-10-CM | POA: Diagnosis not present

## 2024-01-09 DIAGNOSIS — Z87891 Personal history of nicotine dependence: Secondary | ICD-10-CM | POA: Diagnosis not present

## 2024-01-09 DIAGNOSIS — R4701 Aphasia: Secondary | ICD-10-CM | POA: Diagnosis not present

## 2024-01-10 DIAGNOSIS — I480 Paroxysmal atrial fibrillation: Secondary | ICD-10-CM | POA: Diagnosis not present

## 2024-01-10 DIAGNOSIS — I10 Essential (primary) hypertension: Secondary | ICD-10-CM | POA: Diagnosis not present

## 2024-01-10 DIAGNOSIS — I639 Cerebral infarction, unspecified: Secondary | ICD-10-CM | POA: Diagnosis not present

## 2024-01-10 DIAGNOSIS — R072 Precordial pain: Secondary | ICD-10-CM | POA: Diagnosis not present

## 2024-01-10 DIAGNOSIS — I63512 Cerebral infarction due to unspecified occlusion or stenosis of left middle cerebral artery: Secondary | ICD-10-CM | POA: Diagnosis not present

## 2024-01-10 DIAGNOSIS — E785 Hyperlipidemia, unspecified: Secondary | ICD-10-CM | POA: Diagnosis not present

## 2024-01-10 DIAGNOSIS — R900 Intracranial space-occupying lesion found on diagnostic imaging of central nervous system: Secondary | ICD-10-CM | POA: Diagnosis not present

## 2024-01-13 DIAGNOSIS — I10 Essential (primary) hypertension: Secondary | ICD-10-CM | POA: Diagnosis not present

## 2024-01-13 DIAGNOSIS — E7849 Other hyperlipidemia: Secondary | ICD-10-CM | POA: Diagnosis not present

## 2024-01-16 DIAGNOSIS — I639 Cerebral infarction, unspecified: Secondary | ICD-10-CM | POA: Diagnosis not present

## 2024-01-16 DIAGNOSIS — I480 Paroxysmal atrial fibrillation: Secondary | ICD-10-CM | POA: Diagnosis not present

## 2024-01-16 DIAGNOSIS — I10 Essential (primary) hypertension: Secondary | ICD-10-CM | POA: Diagnosis not present

## 2024-01-27 DIAGNOSIS — I480 Paroxysmal atrial fibrillation: Secondary | ICD-10-CM | POA: Diagnosis not present

## 2024-01-27 DIAGNOSIS — I2510 Atherosclerotic heart disease of native coronary artery without angina pectoris: Secondary | ICD-10-CM | POA: Diagnosis not present

## 2024-01-27 DIAGNOSIS — I10 Essential (primary) hypertension: Secondary | ICD-10-CM | POA: Diagnosis not present

## 2024-02-07 DIAGNOSIS — M6281 Muscle weakness (generalized): Secondary | ICD-10-CM | POA: Diagnosis not present

## 2024-02-07 DIAGNOSIS — R262 Difficulty in walking, not elsewhere classified: Secondary | ICD-10-CM | POA: Diagnosis not present

## 2024-02-07 DIAGNOSIS — R2689 Other abnormalities of gait and mobility: Secondary | ICD-10-CM | POA: Diagnosis not present

## 2024-02-11 DIAGNOSIS — Z01818 Encounter for other preprocedural examination: Secondary | ICD-10-CM | POA: Diagnosis not present

## 2024-02-11 DIAGNOSIS — Z01812 Encounter for preprocedural laboratory examination: Secondary | ICD-10-CM | POA: Diagnosis not present

## 2024-02-11 DIAGNOSIS — R918 Other nonspecific abnormal finding of lung field: Secondary | ICD-10-CM | POA: Diagnosis not present

## 2024-02-11 DIAGNOSIS — Z95818 Presence of other cardiac implants and grafts: Secondary | ICD-10-CM | POA: Diagnosis not present

## 2024-02-11 DIAGNOSIS — I4891 Unspecified atrial fibrillation: Secondary | ICD-10-CM | POA: Diagnosis not present

## 2024-02-13 DIAGNOSIS — I480 Paroxysmal atrial fibrillation: Secondary | ICD-10-CM | POA: Diagnosis not present

## 2024-02-13 DIAGNOSIS — I10 Essential (primary) hypertension: Secondary | ICD-10-CM | POA: Diagnosis not present

## 2024-02-13 DIAGNOSIS — I2510 Atherosclerotic heart disease of native coronary artery without angina pectoris: Secondary | ICD-10-CM | POA: Diagnosis not present

## 2024-02-17 DIAGNOSIS — M6281 Muscle weakness (generalized): Secondary | ICD-10-CM | POA: Diagnosis not present

## 2024-02-17 DIAGNOSIS — R2689 Other abnormalities of gait and mobility: Secondary | ICD-10-CM | POA: Diagnosis not present

## 2024-02-17 DIAGNOSIS — R262 Difficulty in walking, not elsewhere classified: Secondary | ICD-10-CM | POA: Diagnosis not present

## 2024-02-19 DIAGNOSIS — R262 Difficulty in walking, not elsewhere classified: Secondary | ICD-10-CM | POA: Diagnosis not present

## 2024-02-19 DIAGNOSIS — M6281 Muscle weakness (generalized): Secondary | ICD-10-CM | POA: Diagnosis not present

## 2024-02-19 DIAGNOSIS — R2689 Other abnormalities of gait and mobility: Secondary | ICD-10-CM | POA: Diagnosis not present

## 2024-02-24 DIAGNOSIS — M6281 Muscle weakness (generalized): Secondary | ICD-10-CM | POA: Diagnosis not present

## 2024-02-24 DIAGNOSIS — R2689 Other abnormalities of gait and mobility: Secondary | ICD-10-CM | POA: Diagnosis not present

## 2024-02-24 DIAGNOSIS — R262 Difficulty in walking, not elsewhere classified: Secondary | ICD-10-CM | POA: Diagnosis not present

## 2024-02-26 DIAGNOSIS — E119 Type 2 diabetes mellitus without complications: Secondary | ICD-10-CM | POA: Diagnosis not present

## 2024-02-26 DIAGNOSIS — I480 Paroxysmal atrial fibrillation: Secondary | ICD-10-CM | POA: Diagnosis not present

## 2024-02-26 DIAGNOSIS — I10 Essential (primary) hypertension: Secondary | ICD-10-CM | POA: Diagnosis not present

## 2024-02-27 DIAGNOSIS — M6281 Muscle weakness (generalized): Secondary | ICD-10-CM | POA: Diagnosis not present

## 2024-02-27 DIAGNOSIS — R2689 Other abnormalities of gait and mobility: Secondary | ICD-10-CM | POA: Diagnosis not present

## 2024-02-27 DIAGNOSIS — R262 Difficulty in walking, not elsewhere classified: Secondary | ICD-10-CM | POA: Diagnosis not present

## 2024-02-28 DIAGNOSIS — M6281 Muscle weakness (generalized): Secondary | ICD-10-CM | POA: Diagnosis not present

## 2024-02-28 DIAGNOSIS — R2689 Other abnormalities of gait and mobility: Secondary | ICD-10-CM | POA: Diagnosis not present

## 2024-02-28 DIAGNOSIS — R262 Difficulty in walking, not elsewhere classified: Secondary | ICD-10-CM | POA: Diagnosis not present

## 2024-03-02 DIAGNOSIS — E785 Hyperlipidemia, unspecified: Secondary | ICD-10-CM | POA: Diagnosis not present

## 2024-03-02 DIAGNOSIS — I10 Essential (primary) hypertension: Secondary | ICD-10-CM | POA: Diagnosis not present

## 2024-03-02 DIAGNOSIS — Z006 Encounter for examination for normal comparison and control in clinical research program: Secondary | ICD-10-CM | POA: Diagnosis not present

## 2024-03-02 DIAGNOSIS — G4733 Obstructive sleep apnea (adult) (pediatric): Secondary | ICD-10-CM | POA: Diagnosis not present

## 2024-03-02 DIAGNOSIS — I480 Paroxysmal atrial fibrillation: Secondary | ICD-10-CM | POA: Diagnosis not present

## 2024-03-02 DIAGNOSIS — I4891 Unspecified atrial fibrillation: Secondary | ICD-10-CM | POA: Diagnosis not present

## 2024-03-02 DIAGNOSIS — I2510 Atherosclerotic heart disease of native coronary artery without angina pectoris: Secondary | ICD-10-CM | POA: Diagnosis not present

## 2024-03-02 DIAGNOSIS — R262 Difficulty in walking, not elsewhere classified: Secondary | ICD-10-CM | POA: Diagnosis not present

## 2024-03-02 DIAGNOSIS — Z9861 Coronary angioplasty status: Secondary | ICD-10-CM | POA: Diagnosis not present

## 2024-03-02 DIAGNOSIS — M6281 Muscle weakness (generalized): Secondary | ICD-10-CM | POA: Diagnosis not present

## 2024-03-02 DIAGNOSIS — R2689 Other abnormalities of gait and mobility: Secondary | ICD-10-CM | POA: Diagnosis not present

## 2024-03-03 DIAGNOSIS — I4891 Unspecified atrial fibrillation: Secondary | ICD-10-CM | POA: Diagnosis not present

## 2024-03-03 DIAGNOSIS — I480 Paroxysmal atrial fibrillation: Secondary | ICD-10-CM | POA: Diagnosis not present

## 2024-03-03 DIAGNOSIS — I2510 Atherosclerotic heart disease of native coronary artery without angina pectoris: Secondary | ICD-10-CM | POA: Diagnosis not present

## 2024-03-03 DIAGNOSIS — Z006 Encounter for examination for normal comparison and control in clinical research program: Secondary | ICD-10-CM | POA: Diagnosis not present

## 2024-03-03 DIAGNOSIS — G4733 Obstructive sleep apnea (adult) (pediatric): Secondary | ICD-10-CM | POA: Diagnosis not present

## 2024-03-09 DIAGNOSIS — M6281 Muscle weakness (generalized): Secondary | ICD-10-CM | POA: Diagnosis not present

## 2024-03-09 DIAGNOSIS — R262 Difficulty in walking, not elsewhere classified: Secondary | ICD-10-CM | POA: Diagnosis not present

## 2024-03-09 DIAGNOSIS — R2689 Other abnormalities of gait and mobility: Secondary | ICD-10-CM | POA: Diagnosis not present

## 2024-03-16 DIAGNOSIS — R2689 Other abnormalities of gait and mobility: Secondary | ICD-10-CM | POA: Diagnosis not present

## 2024-03-16 DIAGNOSIS — M6281 Muscle weakness (generalized): Secondary | ICD-10-CM | POA: Diagnosis not present

## 2024-03-16 DIAGNOSIS — R262 Difficulty in walking, not elsewhere classified: Secondary | ICD-10-CM | POA: Diagnosis not present

## 2024-03-19 DIAGNOSIS — R262 Difficulty in walking, not elsewhere classified: Secondary | ICD-10-CM | POA: Diagnosis not present

## 2024-03-19 DIAGNOSIS — M6281 Muscle weakness (generalized): Secondary | ICD-10-CM | POA: Diagnosis not present

## 2024-03-19 DIAGNOSIS — R2689 Other abnormalities of gait and mobility: Secondary | ICD-10-CM | POA: Diagnosis not present

## 2024-03-26 DIAGNOSIS — R262 Difficulty in walking, not elsewhere classified: Secondary | ICD-10-CM | POA: Diagnosis not present

## 2024-03-26 DIAGNOSIS — M6281 Muscle weakness (generalized): Secondary | ICD-10-CM | POA: Diagnosis not present

## 2024-03-26 DIAGNOSIS — R2689 Other abnormalities of gait and mobility: Secondary | ICD-10-CM | POA: Diagnosis not present

## 2024-03-27 DIAGNOSIS — R2689 Other abnormalities of gait and mobility: Secondary | ICD-10-CM | POA: Diagnosis not present

## 2024-03-27 DIAGNOSIS — R262 Difficulty in walking, not elsewhere classified: Secondary | ICD-10-CM | POA: Diagnosis not present

## 2024-03-27 DIAGNOSIS — M6281 Muscle weakness (generalized): Secondary | ICD-10-CM | POA: Diagnosis not present

## 2024-03-30 DIAGNOSIS — M6281 Muscle weakness (generalized): Secondary | ICD-10-CM | POA: Diagnosis not present

## 2024-03-30 DIAGNOSIS — R262 Difficulty in walking, not elsewhere classified: Secondary | ICD-10-CM | POA: Diagnosis not present

## 2024-03-30 DIAGNOSIS — R2689 Other abnormalities of gait and mobility: Secondary | ICD-10-CM | POA: Diagnosis not present

## 2024-04-01 DIAGNOSIS — R262 Difficulty in walking, not elsewhere classified: Secondary | ICD-10-CM | POA: Diagnosis not present

## 2024-04-01 DIAGNOSIS — R2689 Other abnormalities of gait and mobility: Secondary | ICD-10-CM | POA: Diagnosis not present

## 2024-04-01 DIAGNOSIS — M6281 Muscle weakness (generalized): Secondary | ICD-10-CM | POA: Diagnosis not present

## 2024-04-06 DIAGNOSIS — R262 Difficulty in walking, not elsewhere classified: Secondary | ICD-10-CM | POA: Diagnosis not present

## 2024-04-06 DIAGNOSIS — M6281 Muscle weakness (generalized): Secondary | ICD-10-CM | POA: Diagnosis not present

## 2024-04-06 DIAGNOSIS — R2689 Other abnormalities of gait and mobility: Secondary | ICD-10-CM | POA: Diagnosis not present

## 2024-04-10 DIAGNOSIS — M6281 Muscle weakness (generalized): Secondary | ICD-10-CM | POA: Diagnosis not present

## 2024-04-10 DIAGNOSIS — R2689 Other abnormalities of gait and mobility: Secondary | ICD-10-CM | POA: Diagnosis not present

## 2024-04-10 DIAGNOSIS — R262 Difficulty in walking, not elsewhere classified: Secondary | ICD-10-CM | POA: Diagnosis not present

## 2024-04-13 DIAGNOSIS — R2689 Other abnormalities of gait and mobility: Secondary | ICD-10-CM | POA: Diagnosis not present

## 2024-04-13 DIAGNOSIS — R262 Difficulty in walking, not elsewhere classified: Secondary | ICD-10-CM | POA: Diagnosis not present

## 2024-04-13 DIAGNOSIS — M6281 Muscle weakness (generalized): Secondary | ICD-10-CM | POA: Diagnosis not present
